# Patient Record
Sex: Female | Born: 1960 | Race: Black or African American | Hispanic: No | Smoking: Never smoker
Health system: Southern US, Community
[De-identification: ages and names within clinical notes are randomized; demographics above are authoritative.]

## PROBLEM LIST (undated history)

## (undated) DIAGNOSIS — I10 Essential (primary) hypertension: Secondary | ICD-10-CM

## (undated) DIAGNOSIS — B019 Varicella without complication: Secondary | ICD-10-CM

## (undated) DIAGNOSIS — E7800 Pure hypercholesterolemia, unspecified: Secondary | ICD-10-CM

## (undated) DIAGNOSIS — D219 Benign neoplasm of connective and other soft tissue, unspecified: Secondary | ICD-10-CM

## (undated) DIAGNOSIS — E785 Hyperlipidemia, unspecified: Secondary | ICD-10-CM

## (undated) HISTORY — DX: Benign neoplasm of connective and other soft tissue, unspecified: D21.9

## (undated) HISTORY — PX: NO PAST SURGERIES: SHX2092

## (undated) HISTORY — DX: Varicella without complication: B01.9

## (undated) HISTORY — DX: Hyperlipidemia, unspecified: E78.5

---

## 2011-11-15 HISTORY — PX: COLONOSCOPY: SHX174

## 2014-05-21 ENCOUNTER — Other Ambulatory Visit: Payer: Self-pay

## 2014-05-21 DIAGNOSIS — Z1231 Encounter for screening mammogram for malignant neoplasm of breast: Secondary | ICD-10-CM

## 2014-06-02 ENCOUNTER — Ambulatory Visit
Admission: RE | Admit: 2014-06-02 | Discharge: 2014-06-02 | Disposition: A | Discharge status: Home / Self Care | Payer: 59 | Source: Ambulatory Visit

## 2014-06-02 DIAGNOSIS — Z1231 Encounter for screening mammogram for malignant neoplasm of breast: Secondary | ICD-10-CM

## 2014-07-16 ENCOUNTER — Encounter: Payer: Self-pay | Admitting: Internal Medicine

## 2014-07-16 ENCOUNTER — Ambulatory Visit (INDEPENDENT_AMBULATORY_CARE_PROVIDER_SITE_OTHER): Payer: 59 | Admitting: Internal Medicine

## 2014-07-16 VITALS — BP 104/72 | Temp 98.7°F | Ht 63.0 in | Wt 144.0 lb

## 2014-07-16 DIAGNOSIS — D6489 Other specified anemias: Secondary | ICD-10-CM

## 2014-07-16 DIAGNOSIS — Z299 Encounter for prophylactic measures, unspecified: Secondary | ICD-10-CM

## 2014-07-16 NOTE — Patient Instructions (Signed)
Get copy of  colonoscopy  And any immunizations. Last set of blood work.   Plan fasting lab work  At [facility] . I will put in orders .  Glad you are healthy .  Healthy lifestyle includes : At least 150 minutes of exercise weeks  , weight at healthy levels, which is usually   BMI 19-25. Avoid trans fats and processed foods;  Increase fresh fruits and veges to 5 servings per day. And avoid sweet beverages including tea and juice. Mediterranean diet with olive oil and nuts have been noted to be heart and brain healthy . Avoid tobacco products . Limit  alcohol to  7 per week for women and 14 servings for men.  Get adquate sleep .  If all ok   Then wellness in a year   i

## 2014-07-16 NOTE — Progress Notes (Signed)
Pre visit review using our clinic review tool, if applicable. No additional management support is needed unless otherwise documented below in the visit note.  Chief Complaint  Patient presents with  . Establish Care    HPI: Patient Sharon Wells  53 y.o. 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 Previous care was   [facility] Dr. Antionette Char. from when she moved about 6-8 months ago. She is generally well no surgeries or major illnesses. Medical history questionnaire reviewed. 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 She had had significant anemia probably from hypermenorrhea around menopause. She believes that her last full CBC showed a hemoglobin in the 7-8 range. Since then she had taken iron and felt some better but it hasn't been rechecked. No unusual bruising or bleeding. No personal history of clotting. No history of complicated pregnancy. Reports good cholesterol levels last time it was checked.  Health Maintenance  Topic Date Due  . Influenza Vaccine  06/14/2014  . Mammogram  06/02/2016  . Pap Smear  05/08/2017  . Colonoscopy  11/14/2022  . Tetanus/tdap  11/14/2022   Health Maintenance Review LIFESTYLE:  Exercise:  At least 3 times a week currently [employer] class Tobacco/ETS: No Alcohol: per day no Sugar beverages: Sleep: 5 hours household of 1 Drug use: no Colonoscopy: 2014 Mammogram 06/02/2014 Pap 05/09/2014 no history of abnormal Says she is up-to-date on immunizations records pending.   ROS:  No restrictions exercise physical trying to get rid of a few pounds. GEN/ HEENT: No fever, significant weight changes sweats headaches vision problems hearing changes, CV/ PULM; No chest pain shortness of breath cough, syncope,edema  change in exercise tolerance. GI /GU: No adominal pain, vomiting, change in bowel habits. No blood in the stool. No significant GU symptoms. SKIN/HEME: ,no acute skin  rashes suspicious lesions or bleeding. No lymphadenopathy, nodules, masses.  NEURO/ PSYCH:  No neurologic signs such as weakness numbness. No depression anxiety. IMM/ Allergy: No unusual infections.  Allergy .   REST of 12 system review negative except as per HPI   Past Medical History  Diagnosis Date  . Fibroids     With history of anemia  . Chicken pox     Family History  Problem Relation Age of Onset  . Sarcoidosis Mother     Deceased in her 35s  . Hypertension Sister   . Diabetes Sister   . Sarcoidosis Sister   . COPD Sister     Emphysema  . Pulmonary embolism Sister     Apparently am provoked  . Heart disease Maternal Grandmother     History   Social History  . Marital Status: Legally Separated    Spouse Name: N/A    Number of Children: N/A  . Years of Education: N/A   Social History Main Topics  . Smoking status: Never Smoker   . Smokeless tobacco: Never Used  . Alcohol Use: No  . Drug Use: No  . Sexual Activity: None   Other Topics Concern  . 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    Outpatient Encounter Prescriptions as  of 07/16/2014  Medication Sig  . NON FORMULARY [employer]: Multivitamin Complex with TRE-EN-EN Grain Concentrates  . NON FORMULARY [employer]:  Calcium 300 mg, D3 1000 iu and Magnesium 150 mg.  Takes 3 Daily    EXAM:  BP 104/72  Temp(Src) 98.7 F (37.1 C) (Oral)  Ht 5\' 3"  (1.6 m)  Wt 144 lb (65.318 kg)  BMI 25.51 kg/m2  LMP 12/15/2013  Body mass index is 25.51 kg/(m^2).  Physical Exam: Vital signs reviewed AOZ:HYQM is a well-developed well-nourished alert cooperative    who appearsr stated age in no acute distress.  HEENT: normocephalic atraumatic , grossly normal NECK: supple  without masses, thyromegaly or bruits. CHEST/PULM:  Clear to auscultation and percussion breath sounds equal no wheeze , rales or rhonchi.  CV: PMI is nondisplaced, S1 S2 no gallops, murmurs, rubs. Peripheral pulses are full without delay.No JVD .  ABDOMEN: Bowel sounds normal nontender  No guard or rebound, no hepato splenomegal no CVA tenderness.  Extremtities:  No clubbing cyanosis or edema, no acute joint swelling or redness no focal atrophy NEURO:  Oriented x3, cranial nerves 3-12 appear to be intact, no obvious focal weakness,gait within normal limits SKIN: No acute rashes normal turgor, color, no bruising or petechiae. PSYCH: Oriented, good eye contact, no obvious depression anxiety, cognition and judgment appear normal. LN: no cervical adenopathy   ASSESSMENT AND PLAN:  Discussed the following assessment and plan:  Other specified anemias - By history probably from hypermenorrhea around menopause probably better no recent bleeding plan CPX labs and ferritin at the [facility] lab - Plan: Basic metabolic panel, CBC with Differential, Hepatic function panel, Lipid panel, TSH, Ferritin  Preventive measure - Plan: Basic metabolic panel, CBC with Differential, Hepatic function panel, Lipid panel, TSH, Ferritin Reviewed information and is copy of pertinent immunizations colonoscopy etc. Sign up for my chart if lab is okay can see her in a year with CPX labs as appropriate. Patient Care Team: Burnis Medin, MD as PCP - General (Internal Medicine) Princess Bruins, MD as Consulting Physician (Obstetrics and Gynecology) Patient Instructions  Get copy of  colonoscopy  And any immunizations. Last set of blood work.   Plan fasting lab work  At [facility] . I will put in orders .  Glad you are healthy .  Healthy lifestyle includes : At least 150 minutes of exercise weeks  , weight at healthy levels, which is usually   BMI 19-25. Avoid trans fats and processed foods;  Increase fresh fruits and  veges to 5 servings per day. And avoid sweet beverages including tea and juice. Mediterranean diet with olive oil and nuts have been noted to be heart and brain healthy . Avoid tobacco products . Limit  alcohol to  7 per week for women and 14 servings for men.  Get adquate sleep .  If all ok   Then wellness in a year   i     Sante Biedermann K. Nuchem Grattan M.D.

## 2014-09-02 ENCOUNTER — Emergency Department (HOSPITAL_COMMUNITY)
Admission: EM | Admit: 2014-09-02 | Discharge: 2014-09-03 | Disposition: A | Discharge status: Home / Self Care | Payer: PRIVATE HEALTH INSURANCE | Attending: Emergency Medicine | Admitting: Emergency Medicine

## 2014-09-02 ENCOUNTER — Encounter (HOSPITAL_COMMUNITY): Payer: Self-pay | Admitting: Emergency Medicine

## 2014-09-02 DIAGNOSIS — T7840XA Allergy, unspecified, initial encounter: Secondary | ICD-10-CM

## 2014-09-02 DIAGNOSIS — Z79899 Other long term (current) drug therapy: Secondary | ICD-10-CM | POA: Insufficient documentation

## 2014-09-02 DIAGNOSIS — Z8742 Personal history of other diseases of the female genital tract: Secondary | ICD-10-CM | POA: Diagnosis not present

## 2014-09-02 DIAGNOSIS — Z8619 Personal history of other infectious and parasitic diseases: Secondary | ICD-10-CM | POA: Diagnosis not present

## 2014-09-02 DIAGNOSIS — R0989 Other specified symptoms and signs involving the circulatory and respiratory systems: Secondary | ICD-10-CM | POA: Insufficient documentation

## 2014-09-02 DIAGNOSIS — T50B95A Adverse effect of other viral vaccines, initial encounter: Secondary | ICD-10-CM | POA: Insufficient documentation

## 2014-09-02 MED ORDER — METHYLPREDNISOLONE SODIUM SUCC 125 MG IJ SOLR
125.0000 mg | Freq: Once | INTRAMUSCULAR | Status: AC
  Administered 2014-09-02: 125 mg via INTRAVENOUS
  Filled 2014-09-02: qty 2

## 2014-09-02 MED ORDER — FAMOTIDINE IN NACL 20-0.9 MG/50ML-% IV SOLN
20.0000 mg | Freq: Once | INTRAVENOUS | Status: AC
  Administered 2014-09-02: 20 mg via INTRAVENOUS
  Filled 2014-09-02: qty 50

## 2014-09-02 MED ORDER — SODIUM CHLORIDE 0.9 % IV BOLUS (SEPSIS)
1000.0000 mL | Freq: Once | INTRAVENOUS | Status: AC
  Administered 2014-09-02: 1000 mL via INTRAVENOUS

## 2014-09-02 MED ORDER — DIPHENHYDRAMINE HCL 50 MG/ML IJ SOLN
25.0000 mg | Freq: Once | INTRAMUSCULAR | Status: AC
  Administered 2014-09-02: 25 mg via INTRAVENOUS
  Filled 2014-09-02: qty 1

## 2014-09-02 MED ORDER — PREDNISONE 20 MG PO TABS: 40.0000 mg | ORAL_TABLET | Freq: Every day | ORAL | Status: DC

## 2014-09-02 MED ORDER — LORAZEPAM 2 MG/ML IJ SOLN
1.0000 mg | Freq: Once | INTRAMUSCULAR | Status: AC
  Administered 2014-09-02: 0.5 mg via INTRAVENOUS
  Filled 2014-09-02: qty 1

## 2014-09-02 NOTE — ED Notes (Signed)
Pt reports decrease in shaking.

## 2014-09-02 NOTE — ED Notes (Signed)
Pt presents with c/o allergic reaction to the flu shot. Pt received the flu shot a little before 3, known egg allergy so she was given the egg free kind. Pt reports she started having symptoms after she left the office, feels like she is having some trouble breathing and her throat feels like it is closing at this point.

## 2014-09-02 NOTE — Discharge Instructions (Signed)

## 2014-09-02 NOTE — ED Notes (Signed)
Pt denies change in symptoms. PA notified.

## 2014-09-02 NOTE — ED Provider Notes (Signed)
CSN: [id number]     Arrival date & time 09/02/14  1541 History   First MD Initiated Contact with Patient 09/02/14 1553     Chief Complaint  Patient presents with  . Allergic Reaction     (Consider location/radiation/quality/duration/timing/severity/associated sxs/prior Treatment) HPI Comments: Patient is a 53 year old female with history of fibroids and chickenpox who presents to the emergency department today with the sensation that her throat is closing. She reports be few hours ago she received the egg free flu shot as she is allergic to eggs. Initially she felt as though her throat was scratchy and asked her coworker for Benadryl. She was unable to obtain a Benadryl. This has gradually worsened into sensation that her throat is closing. She denies any chest pain or shortness of breath. No rash, nausea, vomiting.  The history is provided by the patient. No language interpreter was used.    Past Medical History  Diagnosis Date  . Fibroids     With history of anemia  . Chicken pox    History reviewed. No pertinent past surgical history. Family History  Problem Relation Age of Onset  . Sarcoidosis Mother     Deceased in her 23s  . Hypertension Sister   . Diabetes Sister   . Sarcoidosis Sister   . COPD Sister     Emphysema  . Pulmonary embolism Sister     Apparently am provoked  . Heart disease Maternal Grandmother    History  Substance Use Topics  . Smoking status: Never Smoker   . Smokeless tobacco: Never Used  . Alcohol Use: No   OB History   Grav Para Term Preterm Abortions TAB SAB Ect Mult Living   3 3 3             Review of Systems  Constitutional: Negative for fever and chills.  HENT: Positive for trouble swallowing.        Sensation throat is closing  Respiratory: Negative for shortness of breath.   Cardiovascular: Negative for chest pain.  Gastrointestinal: Negative for nausea, vomiting and abdominal pain.  All other systems reviewed and are  negative.     Allergies  Eggs or egg-derived products  Home Medications   Prior to Admission medications   Medication Sig Start Date End Date Taking? Authorizing Provider  NON FORMULARY [employer]: Multivitamin Complex with TRE-EN-EN Grain Concentrates   Yes Historical Provider, MD  NON FORMULARY [employer]:  Calcium 300 mg, D3 1000 iu and Magnesium 150 mg.  Takes 3 Daily   Yes Historical Provider, MD   BP 129/76  Pulse 90  Temp(Src) 98.6 F (37 C) (Oral)  Resp 16  SpO2 100% Physical Exam  Nursing note and vitals reviewed. Constitutional: She is oriented to person, place, and time. She appears well-developed and well-nourished. No distress.  HENT:  Head: Normocephalic and atraumatic.  Right Ear: External ear normal.  Left Ear: External ear normal.  Nose: Nose normal.  Mouth/Throat: Oropharynx is clear and moist.  Easily maintaining own secretions.   Eyes: Conjunctivae are normal.  Neck: Normal range of motion.  Cardiovascular: Normal rate, regular rhythm and normal heart sounds.   Pulmonary/Chest: Effort normal and breath sounds normal. No stridor. No respiratory distress. She has no wheezes. She has no rales.  Speaking in full sentences.  Abdominal: Soft. She exhibits no distension.  Musculoskeletal: Normal range of motion.  Neurological: She is alert and oriented to person, place, and time. She has normal strength.  Skin: Skin is warm and dry.  She is not diaphoretic. No erythema.  Psychiatric: She has a normal mood and affect. Her behavior is normal.    ED Course  Procedures (including critical care time) Labs Review Labs Reviewed - No data to display  Imaging Review No results found.   EKG Interpretation None      MDM   Final diagnoses:  Allergic reaction, initial encounter    Patient re-evaluated prior to dc, is hemodynamically stable, in no respiratory distress, and denies the feeling of throat closing. Patient able to tolerate PO fluids in ED without  issue. Monitored for 4 hours. Pt has been advised to take prednisone and OTC benadryl. Return to the ED if they have a mod-severe allergic rxn (s/s including throat closing, difficulty breathing, swelling of lips face or tongue). Pt is to follow up with their PCP. Pt is agreeable with plan & verbalizes understanding. Dr. Regenia Skeeter evaluated patient and agrees with plan. Patient / Family / Caregiver informed of clinical course, understand medical decision-making process, and agree with plan.   Elwyn Lade, PA-C 09/03/14 0120

## 2014-09-02 NOTE — ED Notes (Signed)
Pt reports given egg free flu shot at 1450. Shortly after pt experienced difficulty swallowing, jittery, and tachycardic. Pt VS stable at present time and reports decrease in symptoms.

## 2014-09-03 ENCOUNTER — Ambulatory Visit (INDEPENDENT_AMBULATORY_CARE_PROVIDER_SITE_OTHER): Payer: PRIVATE HEALTH INSURANCE | Admitting: Internal Medicine

## 2014-09-03 ENCOUNTER — Encounter: Payer: Self-pay | Admitting: Internal Medicine

## 2014-09-03 VITALS — BP 146/88 | HR 81 | Temp 98.8°F | Wt 147.2 lb

## 2014-09-03 DIAGNOSIS — D6489 Other specified anemias: Secondary | ICD-10-CM | POA: Diagnosis not present

## 2014-09-03 DIAGNOSIS — R030 Elevated blood-pressure reading, without diagnosis of hypertension: Secondary | ICD-10-CM | POA: Diagnosis not present

## 2014-09-03 DIAGNOSIS — IMO0001 Reserved for inherently not codable concepts without codable children: Secondary | ICD-10-CM | POA: Insufficient documentation

## 2014-09-03 DIAGNOSIS — Z418 Encounter for other procedures for purposes other than remedying health state: Secondary | ICD-10-CM | POA: Diagnosis not present

## 2014-09-03 DIAGNOSIS — Z299 Encounter for prophylactic measures, unspecified: Secondary | ICD-10-CM

## 2014-09-03 DIAGNOSIS — T7840XS Allergy, unspecified, sequela: Secondary | ICD-10-CM

## 2014-09-03 DIAGNOSIS — T50Z95S Adverse effect of other vaccines and biological substances, sequela: Secondary | ICD-10-CM

## 2014-09-03 DIAGNOSIS — T50Z95A Adverse effect of other vaccines and biological substances, initial encounter: Secondary | ICD-10-CM | POA: Insufficient documentation

## 2014-09-03 LAB — CBC WITH DIFFERENTIAL/PLATELET
Basophils Absolute: 0 10*3/uL (ref 0.0–0.1)
Basophils Relative: 0.1 % (ref 0.0–3.0)
Eosinophils Absolute: 0 10*3/uL (ref 0.0–0.7)
Eosinophils Relative: 0 % (ref 0.0–5.0)
HCT: 36.8 % (ref 36.0–46.0)
HEMOGLOBIN: 12 g/dL (ref 12.0–15.0)
LYMPHS PCT: 6.3 % — AB (ref 12.0–46.0)
Lymphs Abs: 0.7 10*3/uL (ref 0.7–4.0)
MCHC: 32.5 g/dL (ref 30.0–36.0)
MCV: 84.7 fl (ref 78.0–100.0)
MONOS PCT: 2.1 % — AB (ref 3.0–12.0)
Monocytes Absolute: 0.2 10*3/uL (ref 0.1–1.0)
NEUTROS PCT: 91.5 % — AB (ref 43.0–77.0)
Neutro Abs: 10 10*3/uL — ABNORMAL HIGH (ref 1.4–7.7)
Platelets: 317 10*3/uL (ref 150.0–400.0)
RBC: 4.34 Mil/uL (ref 3.87–5.11)
RDW: 13.1 % (ref 11.5–15.5)
WBC: 10.9 10*3/uL — ABNORMAL HIGH (ref 4.0–10.5)

## 2014-09-03 NOTE — Assessment & Plan Note (Signed)
Uncertain cause of her symptoms we'll do an allergy referral to help delineate causes  Her past history of egg allergy was based on a self-report of nausea after having some egg dishes which may not be a true allergy.

## 2014-09-03 NOTE — Progress Notes (Signed)
Pre visit review using our clinic review tool, if applicable. No additional management support is needed unless otherwise documented below in the visit note.  Chief Complaint  Patient presents with  . Reaction to Influenza Vaccine    Pt received flu vaccine on 09/02/14.  She felt like her throat was closing up, shaking, elevated bp and elevated hr.  Seen in ED    HPI: Patient Sharon Wells  comes in today for SDA for  new problem evaluation.Fu ed visit . [facility]   ef[facility] free  flu immunization Sat for 30 mintues  .  Was okay and was in her car driving and developed difficulty swallowing without cough swelling edema or itching  .   Then began hard to swallow. And  Camera operator   Initially thought   Then heart pounding  And and bp was up. 180 range  . Was in the ED and was givensolumedraol and pepcid and pred and then got tremor   After then   Ativan some help   Now feels  Queasy   And hands are still tremoring   No itching or rash never had before.  Has never seen symptoms like this before pred pepcid bid and  Benadryl. Which she hasn't taken yet Throat now hesitant but nothing  Like before felt ok with tea.   In late 53s noted  Felt nauseaous and  Self diagnosed.  Egg allergy was never formal and never had hives with this Remote hx of hives.  As a child and grew out of it.  ROS: See pertinent positives and negatives per HPI. No current chest pain shortness of breath syncope swelling diarrhea  Past Medical History  Diagnosis Date  . Fibroids     With history of anemia  . Chicken pox     Family History  Problem Relation Age of Onset  . Sarcoidosis Mother     Deceased in her 55s  . Hypertension Sister   . Diabetes Sister   . Sarcoidosis Sister   . COPD Sister     Emphysema  . Pulmonary embolism Sister     Apparently am provoked  . Heart disease Maternal Grandmother     History   Social History  . Marital Status: Legally Separated    Spouse Name: N/A   Number of Children: N/A  . Years of Education: N/A   Social History Main Topics  . Smoking status: Never Smoker   . Smokeless tobacco: Never Used  . Alcohol Use: No  . Drug Use: No  . Sexual Activity: None   Other Topics Concern  . 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    Outpatient Encounter Prescriptions as of 09/03/2014  Medication Sig  . NON FORMULARY [employer]: Multivitamin Complex with TRE-EN-EN Grain Concentrates  . NON FORMULARY [employer]:  Calcium 300 mg, D3 1000 iu and Magnesium 150 mg.  Takes 3 Daily  . predniSONE (DELTASONE) 20 MG tablet Take 2 tablets (40 mg total) by mouth daily.    EXAM:  BP 146/88  Pulse 81  Temp(Src) 98.8 F (37.1 C) (Oral)  Wt 147 lb 3.2 oz (66.769 kg)  SpO2 98%  Body mass index is 26.08 kg/(m^2).  GENERAL: vitals reviewed and listed above, alert, oriented, appears well hydrated and in no acute distress HEENT: atraumatic, conjunctiva  clear, no obvious abnormalities on inspection of external nose and ears OP : no lesion edema or exudate  NECK: no obvious masses on inspection palpation  LUNGS: clear to auscultation bilaterally, no wheezes, rales or rhonchi, good air movement CV: HRRR, no clubbing cyanosis or  peripheral edema nl cap refill  MS: moves all extremities without noticeable focal  Abnormality Neurologic nonfocal no obvious tremor at this time skin no acute rashes PSYCH: pleasant and cooperative, no obvious depression or anxiety  ASSESSMENT AND PLAN:  Discussed the following assessment and plan:  Adverse reaction to vaccine, sequela - Presumed reaction because of contacts not anaphylactic but throat symptoms alignment treated with prednisone and antihistamines. - Plan:  Ambulatory referral to Allergy  Elevated blood pressure - Elevated during ED visit a bit elevated now we'll monitor at followup  Anemia due to other cause - By history probably from hypermenorrhea around menopause probably better no recent bleeding plan CPX labs and ferritin at the [facility] lab - Plan: Basic metabolic panel, CBC with Differential, Hepatic function panel, Lipid panel, TSH, Ferritin  Preventive measure - Plan: Basic metabolic panel, CBC with Differential, Hepatic function panel, Lipid panel, TSH, Ferritin  Allergic reaction, sequela - Plan: Ambulatory referral to Allergy He is still on prednisone 20 mg twice a day for total of 5 days. Patient asks about doing her lab work today she is not fasting and she is on prednisone we'll have to interpret the lab space on contacts criteria she states she had a copy of her other labs faxed so we can compare. Will review for this I think the tremors are related to the prednisone. -Patient advised to return or notify health care team  if symptoms worsen ,persist or new concerns arise.  Patient Instructions  Will be contacted  about allergy  referral.  Ok to take benadryl or zyrtec. Can cause  Drowsiness I thin the shaking is from prednisone but finish out the med.   Lab today ok with interpretation.  Fu if relapsed and as planned       Wanda K. Panosh M.D.

## 2014-09-03 NOTE — Patient Instructions (Signed)
Will be contacted  about allergy  referral.  Ok to take benadryl or zyrtec. Can cause  Drowsiness I thin the shaking is from prednisone but finish out the med.   Lab today ok with interpretation.  Fu if relapsed and as planned

## 2014-09-04 LAB — LIPID PANEL
Cholesterol: 247 mg/dL — ABNORMAL HIGH (ref 0–200)
HDL: 60.1 mg/dL (ref >39.00)
LDL Cholesterol: 176 mg/dL — ABNORMAL HIGH (ref 0–99)
NonHDL: 186.9
Total CHOL/HDL Ratio: 4
Triglycerides: 54 mg/dL (ref 0.0–149.0)
VLDL: 10.8 mg/dL (ref 0.0–40.0)

## 2014-09-04 LAB — BASIC METABOLIC PANEL
BUN: 14 mg/dL (ref 6–23)
CALCIUM: 9.4 mg/dL (ref 8.4–10.5)
CO2: 29 mEq/L (ref 19–32)
Chloride: 106 mEq/L (ref 96–112)
Creatinine, Ser: 0.5 mg/dL (ref 0.4–1.2)
GFR: 154.1 mL/min (ref >60.00)
Glucose, Bld: 83 mg/dL (ref 70–99)
Potassium: 3.7 mEq/L (ref 3.5–5.1)
SODIUM: 140 meq/L (ref 135–145)

## 2014-09-04 LAB — HEPATIC FUNCTION PANEL
ALT: 17 U/L (ref 0–35)
AST: 19 U/L (ref 0–37)
Albumin: 3.6 g/dL (ref 3.5–5.2)
Alkaline Phosphatase: 66 U/L (ref 39–117)
BILIRUBIN DIRECT: 0 mg/dL (ref 0.0–0.3)
BILIRUBIN TOTAL: 0.6 mg/dL (ref 0.2–1.2)
Total Protein: 7.8 g/dL (ref 6.0–8.3)

## 2014-09-04 LAB — FERRITIN: Ferritin: 35.6 ng/mL (ref 10.0–291.0)

## 2014-09-04 LAB — TSH: TSH: 0.6 u[IU]/mL (ref 0.35–4.50)

## 2014-09-04 NOTE — ED Provider Notes (Signed)
Medical screening examination/treatment/procedure(s) were conducted as a shared visit with non-physician practitioner(s) and myself.  I personally evaluated the patient during the encounter.   EKG Interpretation None       Patient with abnormal throat feeling after getting flu shot. I believe this is more anxiety related as ativan is the only thing that helped partially relieve symptoms. Stable while watched in ED for several hours, no progression of symptoms. Low concern for true allergic reaction, highly doubt anaphylaxis. Stable for discharge.  Ephraim Hamburger, MD 09/04/14 [phone]

## 2014-09-15 ENCOUNTER — Encounter: Payer: Self-pay | Admitting: Internal Medicine

## 2014-09-23 ENCOUNTER — Encounter (HOSPITAL_COMMUNITY): Payer: Self-pay | Admitting: Emergency Medicine

## 2014-09-23 ENCOUNTER — Emergency Department (HOSPITAL_COMMUNITY)
Admission: EM | Admit: 2014-09-23 | Discharge: 2014-09-23 | Disposition: A | Discharge status: Home / Self Care | Payer: PRIVATE HEALTH INSURANCE | Attending: Emergency Medicine | Admitting: Emergency Medicine

## 2014-09-23 DIAGNOSIS — T7840XA Allergy, unspecified, initial encounter: Secondary | ICD-10-CM

## 2014-09-23 DIAGNOSIS — Z7952 Long term (current) use of systemic steroids: Secondary | ICD-10-CM | POA: Insufficient documentation

## 2014-09-23 DIAGNOSIS — R070 Pain in throat: Secondary | ICD-10-CM | POA: Diagnosis not present

## 2014-09-23 DIAGNOSIS — Z8742 Personal history of other diseases of the female genital tract: Secondary | ICD-10-CM | POA: Diagnosis not present

## 2014-09-23 DIAGNOSIS — T50B95A Adverse effect of other viral vaccines, initial encounter: Secondary | ICD-10-CM | POA: Diagnosis present

## 2014-09-23 DIAGNOSIS — Z79899 Other long term (current) drug therapy: Secondary | ICD-10-CM | POA: Diagnosis not present

## 2014-09-23 DIAGNOSIS — Z8619 Personal history of other infectious and parasitic diseases: Secondary | ICD-10-CM | POA: Diagnosis not present

## 2014-09-23 MED ORDER — DIPHENHYDRAMINE HCL 50 MG/ML IJ SOLN
25.0000 mg | Freq: Once | INTRAMUSCULAR | Status: AC
  Administered 2014-09-23: 25 mg via INTRAVENOUS
  Filled 2014-09-23: qty 1

## 2014-09-23 MED ORDER — SODIUM CHLORIDE 0.9 % IV BOLUS (SEPSIS)
1000.0000 mL | Freq: Once | INTRAVENOUS | Status: AC
  Administered 2014-09-23: 1000 mL via INTRAVENOUS

## 2014-09-23 MED ORDER — LORAZEPAM 2 MG/ML IJ SOLN
1.0000 mg | Freq: Once | INTRAMUSCULAR | Status: AC
  Administered 2014-09-23: 0.5 mg via INTRAVENOUS
  Filled 2014-09-23: qty 1

## 2014-09-23 NOTE — ED Provider Notes (Signed)
CSN: [id number]     Arrival date & time 09/23/14  1545 History   First MD Initiated Contact with Patient 09/23/14 1649     Chief Complaint  Patient presents with  . Allergic Reaction     (Consider location/radiation/quality/duration/timing/severity/associated sxs/prior Treatment) HPI  Sharon Wells is a 53 y.o. female without significant past medical history presenting with sensation of throat tightness after she was seen by her allergist and allergy shots for histamine and headache in her left lower arm. Patient immediately felt her throat was scratchy and felt her heart race. The allergist was not concerned and she left. She went to her place of work and took her BP and found it was elevated to 180/90s. She got in the car and felt shaky and came to the ED. Pt did not take any for this. Pt with recent allergic reaction to flu shot. Pt denies, chest pain, SOB, Nausea, vomiting, abdominal pain, rash.    Past Medical History  Diagnosis Date  . Fibroids     With history of anemia  . Chicken pox    History reviewed. No pertinent past surgical history. Family History  Problem Relation Age of Onset  . Sarcoidosis Mother     Deceased in her 35s  . Hypertension Sister   . Diabetes Sister   . Sarcoidosis Sister   . COPD Sister     Emphysema  . Pulmonary embolism Sister     Apparently am provoked  . Heart disease Maternal Grandmother    History  Substance Use Topics  . Smoking status: Never Smoker   . Smokeless tobacco: Never Used  . Alcohol Use: No   OB History    Gravida Para Term Preterm AB TAB SAB Ectopic Multiple Living   3 3 3             Review of Systems  Constitutional: Negative for fever and chills.  HENT: Negative for congestion and rhinorrhea.   Respiratory: Negative for cough and shortness of breath.   Cardiovascular: Negative for chest pain and palpitations.  Gastrointestinal: Negative for nausea, vomiting and diarrhea.  Musculoskeletal: Negative for back pain  and gait problem.  Skin: Negative for rash.  Neurological: Negative for weakness and headaches.      Allergies  Eggs or egg-derived products  Home Medications   Prior to Admission medications   Medication Sig Start Date End Date Taking? Authorizing Provider  NON FORMULARY [employer]: Multivitamin Complex with TRE-EN-EN Grain Concentrates   Yes Historical Provider, MD  NON FORMULARY [employer]:  Calcium 300 mg, D3 1000 iu and Magnesium 150 mg.  Takes 3 Daily   Yes Historical Provider, MD  predniSONE (DELTASONE) 20 MG tablet Take 2 tablets (40 mg total) by mouth daily. 09/02/14   Elwyn Lade, PA-C   BP 116/69 mmHg  Pulse 85  Temp(Src) 98.5 F (36.9 C) (Oral)  Resp 16  SpO2 98% Physical Exam  Constitutional: She appears well-developed and well-nourished. No distress.  HENT:  Head: Normocephalic and atraumatic.  Mouth/Throat: Oropharynx is clear and moist.  No facial, neck or oropharynx swelling. Pt tolerating her secretions. No oral lesions.  Eyes: Conjunctivae and EOM are normal. Right eye exhibits no discharge. Left eye exhibits no discharge.  Neck: Normal range of motion.  Cardiovascular: Normal rate, regular rhythm and normal heart sounds.   Pulmonary/Chest: Effort normal and breath sounds normal. No respiratory distress. She has no wheezes.  Abdominal: Soft. Bowel sounds are normal. She exhibits no distension. There is no tenderness.  Neurological: She is alert. She exhibits normal muscle tone. Coordination normal.  Skin: Skin is warm and dry. She is not diaphoretic.  2cm mild erythematous circular area around injection site. No other rash or erythema.  Nursing note and vitals reviewed.   ED Course  Procedures (including critical care time) Labs Review Labs Reviewed - No data to display  Imaging Review No results found.   EKG Interpretation None      MDM   Final diagnoses:  Allergic reaction, initial encounter   Pt with sensation of throat tightness after  allergy shots from allergist. Her presentation is very similar to presentation 09/02/14 after potential allergic reaction to flu shot. At that visit ativan was the only medication to improve her symptoms and it was noted that anxiety played a role. I suspect it is playing a role today as well. Today patients exam nonconcerning for allergic reaction or anaphylaxis. pts vitals reassuring. Pt tolerating secretions, no facial or oropharynx swelling. No rash, difficulty breathing. Pt may take benadryl as needed.  Pt is to follow up with their PCP. Pt is agreeable with plan & verbalizes understanding.  Discussed return precautions with patient. Discussed all results and patient verbalizes understanding and agrees with plan.        Pura Spice, PA-C 09/24/14 0159  Richarda Blade, MD 09/25/14 [phone]

## 2014-09-23 NOTE — ED Notes (Signed)
Per pt, states she went to allergist and was tested r/t allergic reaction to flu shot on the 20 th-now having elevated HR and BP

## 2014-09-23 NOTE — Discharge Instructions (Signed)
Return to the emergency room with worsening of symptoms, new symptoms or with symptoms that are concerning, especially sensation of throat closing, shortness of breath, rash, abdominal pain, nausea, vomiting, fevers. Follow up with PCP. Continue to take benadryl every 6 hours for next 24 hours.

## 2014-09-24 ENCOUNTER — Telehealth: Payer: Self-pay | Admitting: Internal Medicine

## 2014-09-24 NOTE — Telephone Encounter (Signed)
Pt need a 30 minute ER fup for an allergic reaction.. May I use any 2 slots to schedule her .

## 2014-09-24 NOTE — Telephone Encounter (Signed)
Pt called back and stated she could leave work at 2.

## 2014-09-25 NOTE — Telephone Encounter (Signed)
Pt said her schedule and Dr Regis Bill does not allow her to come this month. She said Dr Regis Bill can review the notes in epic and then let her know if she needs to come in or if she can wait till the new year.

## 2014-09-25 NOTE — Telephone Encounter (Signed)
Pt said she went to see allergist Dr Genia Plants and that  is how she ended up at the ED. She said she does not want to go back .

## 2014-09-25 NOTE — Telephone Encounter (Signed)
i  reveiwed ED note and  would like her to get appt with her allergist  Soon with ED records  To have them review and advise intervention etc   I can see her  In office but   AFTER allergy opinion.

## 2014-09-26 NOTE — Telephone Encounter (Signed)
I can see her next week but i really need medical input from  allergist  And or see  At least get a copy of notes and assessment to decide what happened and what to do going forward .    please get a copy of the notes asap

## 2014-10-01 NOTE — Telephone Encounter (Signed)
We don't contact offices to request notes.Marland Kitchen

## 2014-10-01 NOTE — Telephone Encounter (Signed)
lmovm to c/b and schedule °

## 2014-10-01 NOTE — Telephone Encounter (Signed)
Called Dr.Kozlow office for office note for this pt they are not available at this time,but will sent when dr sign off.

## 2014-10-01 NOTE — Telephone Encounter (Signed)
Pt has been scheduled for Nov 06 2014. Who will be getting the notes from the allergist

## 2014-10-01 NOTE — Telephone Encounter (Signed)
Please get notes from Dr. Ria Clock office for [facility].  Thanks!

## 2014-10-14 NOTE — Telephone Encounter (Signed)
Paper work from Dr. Bruna Potter office received and placed in [employer] tray with other reports.

## 2014-11-06 ENCOUNTER — Ambulatory Visit (INDEPENDENT_AMBULATORY_CARE_PROVIDER_SITE_OTHER): Payer: 59 | Admitting: Internal Medicine

## 2014-11-06 ENCOUNTER — Encounter: Payer: Self-pay | Admitting: Internal Medicine

## 2014-11-06 VITALS — BP 110/70 | HR 68 | Temp 98.4°F | Wt 147.7 lb

## 2014-11-06 DIAGNOSIS — R000 Tachycardia, unspecified: Secondary | ICD-10-CM

## 2014-11-06 DIAGNOSIS — T50Z95S Adverse effect of other vaccines and biological substances, sequela: Secondary | ICD-10-CM

## 2014-11-06 NOTE — Patient Instructions (Addendum)
ekg is good  I agree that anxiety attacks should get better but that there was  Some time of reaction after the vaccine  .  Fu if any recurrent sx .   Uncertain what to tell you about future vaccines . As per  Specialists etc .

## 2014-11-06 NOTE — Progress Notes (Signed)
Chief Complaint  Patient presents with  . Follow-up    allergist    HPI: Sharon Wells 53 y.o.  Comes in for disc about poss allergic or other recation from injection   Had allergy eval dr Carmelina Peal and has /s  No completes  Skin testing neg but never go blood tests and challenge    WC and also didn't feel like going back .  Avoiding foods with eggs has had some anxiety since then but  Aware and now not happening .   Episode in allergise office was   Not stressed and suddenly had elevated hr 120 and then felt bad no hives  bp went up and then got anxiety.  Carried benadryl around with her not use  Never had itching or hives.  ROS: See pertinent positives and negatives per HPI.  Past Medical History  Diagnosis Date  . Fibroids     With history of anemia  . Chicken pox     Family History  Problem Relation Age of Onset  . Sarcoidosis Mother     Deceased in her 78s  . Hypertension Sister   . Diabetes Sister   . Sarcoidosis Sister   . COPD Sister     Emphysema  . Pulmonary embolism Sister     Apparently am provoked  . Heart disease Maternal Grandmother     History   Social History  . Marital Status: Legally Separated    Spouse Name: N/A    Number of Children: N/A  . Years of Education: N/A   Social History Main Topics  . Smoking status: Never Smoker   . Smokeless tobacco: Never Used  . Alcohol Use: No  . Drug Use: No  . Sexual Activity: None   Other Topics Concern  . 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    Outpatient Encounter Prescriptions as of 11/06/2014  Medication Sig  . NON FORMULARY [employer]: Multivitamin Complex with TRE-EN-EN Grain Concentrates  . NON FORMULARY [employer]:  Calcium 300 mg, D3 1000 iu  and Magnesium 150 mg.  Takes 3 Daily  . [DISCONTINUED] predniSONE (DELTASONE) 20 MG tablet Take 2 tablets (40 mg total) by mouth daily.    EXAM:  BP 110/70 mmHg  Pulse 68  Temp(Src) 98.4 F (36.9 C) (Oral)  Wt 147 lb 11.2 oz (66.996 kg)  SpO2 98%  Body mass index is 26.17 kg/(m^2).  GENERAL: vitals reviewed and listed above, alert, oriented, appears well hydrated and in no acute distress HEENT: atraumatic, conjunctiva  clear, no obvious abnormalities on inspection of external nose and ears OP : no lesion edema or exudate  NECK: no obvious masses on inspection palpation  LUNGS: clear to auscultation bilaterally, no wheezes, rales or rhonchi, good air movement CV: HRRR, no clubbing cyanosis or  peripheral edema nl cap refill  MS: moves all extremities without noticeable focal  abnormality PSYCH: pleasant and cooperative, no obvious depression or anxiety EKG NSR nl intervals  ASSESSMENT AND PLAN:  Discussed the following assessment and plan:  Tachycardia - hx of 120 after allergy testing   neg responses - Plan: EKG 12-Lead  Adverse reaction to vaccine, sequela - uncertain cause  see dr Carmelina Peal note Episode of  Tachy poss anxiety other  Uncertain doesn't sound like arrhythmia  No other alarm features .  Follow for now  Would like her to not be fearful of foods and diet  . consider reveval if  Recurring sx  See not dr Carmelina Peal  Never go immmmunocap etc. -Patient advised to return or notify health care team  if symptoms worsen ,persist or new concerns arise.  Patient Instructions  ekg is good  I agree that anxiety attacks should get better but that there was  Some time of reaction after the vaccine  .  Fu if any recurrent sx .   Uncertain what to tell you about future vaccines . As per  Specialists etc .    Standley Brooking. Panosh M.D.  Pre visit review using our clinic review tool, if applicable. No additional management support is needed unless otherwise documented below in the visit  note.

## 2014-12-16 ENCOUNTER — Ambulatory Visit (INDEPENDENT_AMBULATORY_CARE_PROVIDER_SITE_OTHER): Payer: 59

## 2014-12-16 VITALS — BP 119/70 | HR 82 | Resp 12

## 2014-12-16 DIAGNOSIS — Z299 Encounter for prophylactic measures, unspecified: Secondary | ICD-10-CM

## 2014-12-16 DIAGNOSIS — Z418 Encounter for other procedures for purposes other than remedying health state: Secondary | ICD-10-CM

## 2014-12-16 DIAGNOSIS — R52 Pain, unspecified: Secondary | ICD-10-CM

## 2014-12-16 DIAGNOSIS — Q828 Other specified congenital malformations of skin: Secondary | ICD-10-CM

## 2014-12-16 DIAGNOSIS — M2041 Other hammer toe(s) (acquired), right foot: Secondary | ICD-10-CM

## 2014-12-16 NOTE — Progress Notes (Signed)
   Subjective:    Patient ID: Sharon Wells, female    DOB: 13-Jun-1961, 54 y.o.   MRN: [id number]  HPI PT STATED RT FOOT 5TH TOE HAVE CORN FOR 4 YEARS. THE CORN IS GETTING THICKER AND GET AGGRAVATED BY WEARING SHOES. TRIED TO GET IT TRIM BY PEDIATRIST IN [place] AND IT HELP.   Review of Systems  All other systems reviewed and are negative.      Objective:   Physical Exam Lower extremity objective findings reveal a 54 year old F connecting female well-developed well-nourished oriented 3 presents with a complaint of painful corn which is repetitive on the dorsolateral aspect fifth toe right foot. Patient's had multiple keratoses although the fifth right is most painful symptomatically on eating attention. In the past debridement is been provided's altered shoes as well. Lower extremity objective his reveal pedal pulses palpable DP and PT +2 over 4 bilateral Refill time 3 seconds all digits epicritic and proprioceptive sensations intact and symmetric. There is normal plantar response and DTRs. Dermatologically skin color pigment normal hair growth absent nails unremarkable there is keratoses HD 5 bilateral right fifth most painful tenderness symptomatic Wells nucleus circumscribed there is also keratoses over the IP joints of lesser digits fourth bilateral and not proximal IP joints of the second and third bilateral. The the remaining keratoses or not painful symptomatically although pigment A pigment changes are noted. X-rays reveal adductovarus rotation lesser digits with some rigid contracture hammertoe deformity fifth toe right foot. Hallux is rectus forefoot rectus no osseous abnormalities no cyst or tumors lateral projection reveals mild infracalcaneal spurring mild fascial thickening rectus foot type       Assessment & Plan:  Assessment this time hammertoe deformity fifth toe with adductovarus rotation fifth digit in associate keratosis the keratotic painful corn is debrided at this  time tube foam padding is dispensed literature on hammertoe and hammertoe repair for prevention of keratoses is given at this time patient will consider those options reappointed future on an as-needed basis for either palliative care or possible surgical consultation when ready   Harriet Masson DPM

## 2014-12-16 NOTE — Patient Instructions (Signed)
Corns and Calluses Corns are small areas of thickened skin that usually occur on the top, sides, or tip of a toe. They contain a cone-shaped core with a point that can press on a nerve below. This causes pain. Calluses are areas of thickened skin that usually develop on hands, fingers, palms, soles of the feet, and heels. These are areas that experience frequent friction or pressure. CAUSES  Corns are usually the result of rubbing (friction) or pressure from shoes that are too tight or do not fit properly. Calluses are caused by repeated friction and pressure on the affected areas. SYMPTOMS  A hard growth on the skin.  Pain or tenderness under the skin.  Sometimes, redness and swelling.  Increased discomfort while wearing tight-fitting shoes. DIAGNOSIS  Your caregiver can usually tell what the problem is by doing a physical exam. TREATMENT  Removing the cause of the friction or pressure is usually the only treatment needed. However, sometimes medicines can be used to help soften the hardened, thickened areas. These medicines include salicylic acid plasters and 12% ammonium lactate lotion. These medicines should only be used under the direction of your caregiver. HOME CARE INSTRUCTIONS   Try to remove pressure from the affected area.  You may wear donut-shaped corn pads to protect your skin.  You may use a pumice stone or nonmetallic nail file to gently reduce the thickness of a corn.  Wear properly fitted footwear.  If you have calluses on the hands, wear gloves during activities that cause friction.  If you have diabetes, you should regularly examine your feet. Tell your caregiver if you notice any problems with your feet. SEEK IMMEDIATE MEDICAL CARE IF:   You have increased pain, swelling, redness, or warmth in the affected area.  Your corn or callus starts to drain fluid or bleeds.  You are not getting better, even with treatment. Document Released: 08/06/2004 Document  Revised: 01/23/2012 Document Reviewed: 06/28/2011 ExitCare Patient Information 2015 [employer]. This information is not intended to replace advice given to you by your health care provider. Make sure you discuss any questions you have with your health care provider.  

## 2015-07-17 ENCOUNTER — Other Ambulatory Visit (INDEPENDENT_AMBULATORY_CARE_PROVIDER_SITE_OTHER): Payer: 59

## 2015-07-17 ENCOUNTER — Other Ambulatory Visit: Payer: Self-pay

## 2015-07-17 DIAGNOSIS — Z0000 Encounter for general adult medical examination without abnormal findings: Secondary | ICD-10-CM | POA: Diagnosis not present

## 2015-07-17 LAB — CBC WITH DIFFERENTIAL/PLATELET
BASOS PCT: 0.8 % (ref 0.0–3.0)
Basophils Absolute: 0 10*3/uL (ref 0.0–0.1)
EOS ABS: 0.1 10*3/uL (ref 0.0–0.7)
Eosinophils Relative: 3.1 % (ref 0.0–5.0)
HCT: 38.1 % (ref 36.0–46.0)
Hemoglobin: 12.7 g/dL (ref 12.0–15.0)
Lymphocytes Relative: 48.6 % — ABNORMAL HIGH (ref 12.0–46.0)
Lymphs Abs: 1.8 10*3/uL (ref 0.7–4.0)
MCHC: 33.2 g/dL (ref 30.0–36.0)
MCV: 85.3 fl (ref 78.0–100.0)
MONO ABS: 0.3 10*3/uL (ref 0.1–1.0)
Monocytes Relative: 6.9 % (ref 3.0–12.0)
Neutro Abs: 1.5 10*3/uL (ref 1.4–7.7)
Neutrophils Relative %: 40.6 % — ABNORMAL LOW (ref 43.0–77.0)
Platelets: 296 10*3/uL (ref 150.0–400.0)
RBC: 4.47 Mil/uL (ref 3.87–5.11)
RDW: 13 % (ref 11.5–15.5)
WBC: 3.8 10*3/uL — AB (ref 4.0–10.5)

## 2015-07-17 LAB — HEPATIC FUNCTION PANEL
ALT: 23 U/L (ref 0–35)
AST: 19 U/L (ref 0–37)
Albumin: 4.3 g/dL (ref 3.5–5.2)
Alkaline Phosphatase: 69 U/L (ref 39–117)
BILIRUBIN DIRECT: 0.1 mg/dL (ref 0.0–0.3)
BILIRUBIN TOTAL: 0.6 mg/dL (ref 0.2–1.2)
Total Protein: 7.4 g/dL (ref 6.0–8.3)

## 2015-07-17 LAB — LIPID PANEL
CHOLESTEROL: 262 mg/dL — AB (ref 0–200)
HDL: 42.4 mg/dL (ref >39.00)
LDL Cholesterol: 183 mg/dL — ABNORMAL HIGH (ref 0–99)
NonHDL: 219.14
Total CHOL/HDL Ratio: 6
Triglycerides: 179 mg/dL — ABNORMAL HIGH (ref 0.0–149.0)
VLDL: 35.8 mg/dL (ref 0.0–40.0)

## 2015-07-17 LAB — BASIC METABOLIC PANEL
BUN: 12 mg/dL (ref 6–23)
CO2: 28 mEq/L (ref 19–32)
CREATININE: 0.58 mg/dL (ref 0.40–1.20)
Calcium: 9.7 mg/dL (ref 8.4–10.5)
Chloride: 105 mEq/L (ref 96–112)
GFR: 138.43 mL/min (ref >60.00)
Glucose, Bld: 94 mg/dL (ref 70–99)
Potassium: 4.1 mEq/L (ref 3.5–5.1)
Sodium: 141 mEq/L (ref 135–145)

## 2015-07-17 LAB — TSH: TSH: 1.06 u[IU]/mL (ref 0.35–4.50)

## 2015-07-24 ENCOUNTER — Ambulatory Visit (INDEPENDENT_AMBULATORY_CARE_PROVIDER_SITE_OTHER): Payer: 59 | Admitting: Internal Medicine

## 2015-07-24 ENCOUNTER — Encounter: Payer: Self-pay | Admitting: Internal Medicine

## 2015-07-24 VITALS — BP 120/72 | Temp 98.1°F | Ht 63.0 in | Wt 152.0 lb

## 2015-07-24 DIAGNOSIS — Z0000 Encounter for general adult medical examination without abnormal findings: Secondary | ICD-10-CM | POA: Diagnosis not present

## 2015-07-24 DIAGNOSIS — E785 Hyperlipidemia, unspecified: Secondary | ICD-10-CM

## 2015-07-24 NOTE — Patient Instructions (Signed)
Intensify lifestyle interventions. Diet ary changes  ascvd risk 10 year is 4.3% lifetime 39 % not in the statin benefit group but is not a healthy level.  Can getg lipid panel in 4-6 months if you wish to see progress. Otherwise yearly lab and check up.   Healthy lifestyle includes : At least 150 minutes of exercise weeks  , weight at healthy levels, which is usually   BMI 19-25. Avoid trans fats and processed foods;  Increase fresh fruits and veges to 5 servings per day. And avoid sweet beverages including tea and juice. Mediterranean diet with olive oil and nuts have been noted to be heart and brain healthy . Avoid tobacco products . Limit  alcohol to  7 per week for women and 14 servings for men.  Get adequate sleep . Wear seat belts . Don't text and drive .      Why follow it? Research shows. . Those who follow the Mediterranean diet have a reduced risk of heart disease  . The diet is associated with a reduced incidence of Parkinson's and Alzheimer's diseases . People following the diet may have longer life expectancies and lower rates of chronic diseases  . The Dietary Guidelines for Americans recommends the Mediterranean diet as an eating plan to promote health and prevent disease  What Is the Mediterranean Diet?  . Healthy eating plan based on typical foods and recipes of Mediterranean-style cooking . The diet is primarily a plant based diet; these foods should make up a majority of meals   Starches - Plant based foods should make up a majority of meals - They are an important sources of vitamins, minerals, energy, antioxidants, and fiber - Choose whole grains, foods high in fiber and minimally processed items  - Typical grain sources include wheat, oats, barley, corn, brown rice, bulgar, farro, millet, polenta, couscous  - Various types of beans include chickpeas, lentils, fava beans, black beans, white beans   Fruits  Veggies - Large quantities of antioxidant rich fruits &  veggies; 6 or more servings  - Vegetables can be eaten raw or lightly drizzled with oil and cooked  - Vegetables common to the traditional Mediterranean Diet include: artichokes, arugula, beets, broccoli, brussel sprouts, cabbage, carrots, celery, collard greens, cucumbers, eggplant, kale, leeks, lemons, lettuce, mushrooms, okra, onions, peas, peppers, potatoes, pumpkin, radishes, rutabaga, shallots, spinach, sweet potatoes, turnips, zucchini - Fruits common to the Mediterranean Diet include: apples, apricots, avocados, cherries, clementines, dates, figs, grapefruits, grapes, melons, nectarines, oranges, peaches, pears, pomegranates, strawberries, tangerines  Fats - Replace butter and margarine with healthy oils, such as olive oil, canola oil, and tahini  - Limit nuts to no more than a handful a day  - Nuts include walnuts, almonds, pecans, pistachios, pine nuts  - Limit or avoid candied, honey roasted or heavily salted nuts - Olives are central to the [employer] - can be eaten whole or used in a variety of dishes   Meats Protein - Limiting red meat: no more than a few times a month - When eating red meat: choose lean cuts and keep the portion to the size of deck of cards - Eggs: approx. 0 to 4 times a week  - Fish and lean poultry: at least 2 a week  - Healthy protein sources include, chicken, [place], lean beef, lamb - Increase intake of seafood such as tuna, salmon, trout, mackerel, shrimp, scallops - Avoid or limit high fat processed meats such as sausage and bacon  Dairy -  Include moderate amounts of low fat dairy products  - Focus on healthy dairy such as fat free yogurt, skim milk, low or reduced fat cheese - Limit dairy products higher in fat such as whole or 2% milk, cheese, ice cream  Alcohol - Moderate amounts of red wine is ok  - No more than 5 oz daily for women (all ages) and men older than age 30  - No more than 10 oz of wine daily for men younger than 9  Other - Limit  sweets and other desserts  - Use herbs and spices instead of salt to flavor foods  - Herbs and spices common to the traditional Mediterranean Diet include: basil, bay leaves, chives, cloves, cumin, fennel, garlic, lavender, marjoram, mint, oregano, parsley, pepper, rosemary, sage, savory, sumac, tarragon, thyme   It's not just a diet, it's a lifestyle:  . The Mediterranean diet includes lifestyle factors typical of those in the region  . Foods, drinks and meals are best eaten with others and savored . Daily physical activity is important for overall good health . This could be strenuous exercise like running and aerobics . This could also be more leisurely activities such as walking, housework, yard-work, or taking the stairs . Moderation is the key; a balanced and healthy diet accommodates most foods and drinks . Consider portion sizes and frequency of consumption of certain foods   Meal Ideas & Options:  . Breakfast:  o Whole wheat toast or whole wheat English muffins with peanut butter & hard boiled egg o Steel cut oats topped with apples & cinnamon and skim milk  o Fresh fruit: banana, strawberries, melon, berries, peaches  o Smoothies: strawberries, bananas, greek yogurt, peanut butter o Low fat greek yogurt with blueberries and granola  o Egg white omelet with spinach and mushrooms o Breakfast couscous: whole wheat couscous, apricots, skim milk, cranberries  . Sandwiches:  o Hummus and grilled vegetables (peppers, zucchini, squash) on whole wheat bread   o Grilled chicken on whole wheat pita with lettuce, tomatoes, cucumbers or tzatziki  o Tuna salad on whole wheat bread: tuna salad made with greek yogurt, olives, red peppers, capers, green onions o Garlic rosemary lamb pita: lamb sauted with garlic, rosemary, salt & pepper; add lettuce, cucumber, greek yogurt to pita - flavor with lemon juice and black pepper  . Seafood:  o Mediterranean grilled salmon, seasoned with garlic,  basil, parsley, lemon juice and black pepper o Shrimp, lemon, and spinach whole-grain pasta salad made with low fat greek yogurt  o Seared scallops with lemon orzo  o Seared tuna steaks seasoned salt, pepper, coriander topped with tomato mixture of olives, tomatoes, olive oil, minced garlic, parsley, green onions and cappers  . Meats:  o Herbed greek chicken salad with kalamata olives, cucumber, feta  o Red bell peppers stuffed with spinach, bulgur, lean ground beef (or lentils) & topped with feta   o Kebabs: skewers of chicken, tomatoes, onions, zucchini, squash  o [place] burgers: made with red onions, mint, dill, lemon juice, feta cheese topped with roasted red peppers . Vegetarian o Cucumber salad: cucumbers, artichoke hearts, celery, red onion, feta cheese, tossed in olive oil & lemon juice  o Hummus and whole grain pita points with a greek salad (lettuce, tomato, feta, olives, cucumbers, red onion) o Lentil soup with celery, carrots made with vegetable broth, garlic, salt and pepper  o Tabouli salad: parsley, bulgur, mint, scallions, cucumbers, tomato, radishes, lemon juice, olive oil, salt and pepper.

## 2015-07-24 NOTE — Progress Notes (Signed)
Pre visit review using our clinic review tool, if applicable. No additional management support is needed unless otherwise documented below in the visit note.  Chief Complaint  Patient presents with  . Annual Exam    HPI: Patient  Sharon Wells  54 y.o. comes in today for Preventive Health Care visit   Not eating as well recently fast food  Some sugars   Feels well menopausal  utd on parameters   Health Maintenance  Topic Date Due  . Hepatitis C Screening  1961-08-29  . INFLUENZA VACCINE  07/22/2016 (Originally 06/15/2015)  . HIV Screening  07/22/2016 (Originally 10/19/1974)  . MAMMOGRAM  06/02/2016  . PAP SMEAR  05/08/2017  . COLONOSCOPY  11/14/2022  . TETANUS/TDAP  05/08/2023   Health Maintenance Review LIFESTYLE:  Exercise:   Walking  Some zumba  Tobacco/ETS: no Alcohol: no Sugar beverages: ocass  Fruit punch  Sleep: off an on .    Sometimes  Drug use: no  ROS:  GEN/ HEENT: No fever, significant weight changes sweats headaches vision problems hearing changes, CV/ PULM; No chest pain shortness of breath cough, syncope,edema  change in exercise tolerance. GI /GU: No adominal pain, vomiting, change in bowel habits. No blood in the stool. No significant GU symptoms. SKIN/HEME: ,no acute skin rashes suspicious lesions or bleeding. No lymphadenopathy, nodules, masses.  NEURO/ PSYCH:  No neurologic signs such as weakness numbness. No depression anxiety. IMM/ Allergy: No unusual infections.  Allergy .   REST of 12 system review negative except as per HPI   Past Medical History  Diagnosis Date  . Fibroids     With history of anemia  . Chicken pox     No past surgical history on file.  Family History  Problem Relation Age of Onset  . Sarcoidosis Mother     Deceased in her 17s  . Hypertension Sister   . Diabetes Sister   . Sarcoidosis Sister   . COPD Sister     Emphysema  . Pulmonary embolism Sister     Apparently am provoked  . Heart disease Maternal Grandmother       Social History   Social History  . Marital Status: Legally Separated    Spouse Name: N/A  . Number of Children: N/A  . Years of Education: N/A   Social History Main Topics  . Smoking status: Never Smoker   . Smokeless tobacco: Never Used  . Alcohol Use: No  . Drug Use: No  . Sexual Activity: Not Asked   Other Topics Concern  . 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    Outpatient Prescriptions Prior to Visit  Medication Sig Dispense Refill  . NON FORMULARY [facility]: Multivitamin Complex with TRE-EN-EN Grain Concentrates    . NON FORMULARY [facility]:  Calcium 300 mg, D3 1000 iu and Magnesium 150 mg.  Takes 3 Daily     No facility-administered medications prior to visit.     EXAM:  BP 120/72 mmHg  Temp(Src) 98.1 F (36.7 C) (Oral)  Ht 5\' 3"  (1.6 m)  Wt 152 lb (68.947 kg)  BMI  26.93 kg/m2  Body mass index is 26.93 kg/(m^2).  Physical Exam: Vital signs reviewed NAT:FTDD is a well-developed well-nourished alert cooperative    who appearsr stated age in no acute distress.  HEENT: normocephalic atraumatic , Eyes: PERRL EOM's full, conjunctiva clear, Nares: paten,t no deformity discharge or tenderness., Ears: no deformity EAC's clear TMs with normal landmarks. Mouth: clear OP, no lesions, edema.  Moist mucous membranes. Dentition in adequate repair. Partials  Upper plate  NECK: supple without masses, thyromegaly or bruits. CHEST/PULM:  Clear to auscultation and percussion breath sounds equal no wheeze , rales or rhonchi. No chest wall deformities or tenderness.Breast: normal by inspection . No dimpling, discharge, masses, tenderness or discharge . CV: PMI is nondisplaced,  S1 S2 no gallops, murmurs, rubs. Peripheral pulses are full without delay.No JVD .  ABDOMEN: Bowel sounds normal nontender  No guard or rebound, no hepato splenomegal no CVA tenderness.  Extremtities:  No clubbing cyanosis or edema, no acute joint swelling or redness no focal atrophy NEURO:  Oriented x3, cranial nerves 3-12 appear to be intact, no obvious focal weakness,gait within normal limits no abnormal reflexes or asymmetrical SKIN: No acute rashes normal turgor, color, no bruising or petechiae. PSYCH: Oriented, good eye contact, no obvious depression anxiety, cognition and judgment appear normal. LN: no cervical axillary inguinal adenopathy  Lab Results  Component Value Date   WBC 3.8* 07/17/2015   HGB 12.7 07/17/2015   HCT 38.1 07/17/2015   PLT 296.0 07/17/2015   GLUCOSE 94 07/17/2015   CHOL 262* 07/17/2015   TRIG 179.0* 07/17/2015   HDL 42.40 07/17/2015   LDLCALC 183* 07/17/2015   ALT 23 07/17/2015   AST 19 07/17/2015   NA 141 07/17/2015   K 4.1 07/17/2015   CL 105 07/17/2015   CREATININE 0.58 07/17/2015   BUN 12 07/17/2015   CO2 28 07/17/2015   TSH 1.06 07/17/2015   BP Readings from Last 3 Encounters:  07/24/15 120/72  12/16/14 119/70  11/06/14 110/70    ASSESSMENT AND PLAN:  Discussed the following assessment and plan:  Visit for preventive health examination  Hyperlipidemia - disc lsi can repeat in 6 months no ov needed  yearly check pt educated in [place]: Lipid panel acvs risk lipids 4.3% 10 year. Patient Care Team: Burnis Medin, MD as PCP - General (Internal Medicine) Princess Bruins, MD as Consulting Physician (Obstetrics and Gynecology) Patient Instructions    Intensify lifestyle interventions. Diet ary changes  ascvd risk 10 year is 4.3% lifetime 39 % not in the statin benefit group but is not a healthy level.  Can getg lipid panel in 4-6 months if you wish to see progress. Otherwise yearly lab and check up.   Healthy lifestyle includes  : At least 150 minutes of exercise weeks  , weight at healthy levels, which is usually   BMI 19-25. Avoid trans fats and processed foods;  Increase fresh fruits and veges to 5 servings per day. And avoid sweet beverages including tea and juice. Mediterranean diet with olive oil and nuts have been noted to be heart and brain healthy . Avoid tobacco products . Limit  alcohol to  7 per week for women and 14 servings for men.  Get adequate sleep . Wear seat belts . Don't text and drive .      Why follow it? Research shows. . Those who follow the Mediterranean diet have a reduced risk of heart disease  . The diet is associated with a  reduced incidence of Parkinson's and Alzheimer's diseases . People following the diet may have longer life expectancies and lower rates of chronic diseases  . The Dietary Guidelines for Americans recommends the Mediterranean diet as an eating plan to promote health and prevent disease  What Is the Mediterranean Diet?  . Healthy eating plan based on typical foods and recipes of Mediterranean-style cooking . The diet is primarily a plant based diet; these foods should make up a majority of meals   Starches - Plant based foods should make up a majority of meals - They are an important sources of vitamins, minerals, energy, antioxidants, and fiber - Choose whole grains, foods high in fiber and minimally processed items  - Typical grain sources include wheat, oats, barley, corn, brown rice, bulgar, farro, millet, polenta, couscous  - Various types of beans include chickpeas, lentils, fava beans, black beans, white beans   Fruits  Veggies - Large quantities of antioxidant rich fruits & veggies; 6 or more servings  - Vegetables can be eaten raw or lightly drizzled with oil and cooked  - Vegetables common to the traditional Mediterranean Diet include: artichokes, arugula, beets, broccoli, brussel sprouts, cabbage, carrots, celery, collard greens, cucumbers, eggplant, kale,  leeks, lemons, lettuce, mushrooms, okra, onions, peas, peppers, potatoes, pumpkin, radishes, rutabaga, shallots, spinach, sweet potatoes, turnips, zucchini - Fruits common to the Mediterranean Diet include: apples, apricots, avocados, cherries, clementines, dates, figs, grapefruits, grapes, melons, nectarines, oranges, peaches, pears, pomegranates, strawberries, tangerines  Fats - Replace butter and margarine with healthy oils, such as olive oil, canola oil, and tahini  - Limit nuts to no more than a handful a day  - Nuts include walnuts, almonds, pecans, pistachios, pine nuts  - Limit or avoid candied, honey roasted or heavily salted nuts - Olives are central to the [employer] - can be eaten whole or used in a variety of dishes   Meats Protein - Limiting red meat: no more than a few times a month - When eating red meat: choose lean cuts and keep the portion to the size of deck of cards - Eggs: approx. 0 to 4 times a week  - Fish and lean poultry: at least 2 a week  - Healthy protein sources include, chicken, [place], lean beef, lamb - Increase intake of seafood such as tuna, salmon, trout, mackerel, shrimp, scallops - Avoid or limit high fat processed meats such as sausage and bacon  Dairy - Include moderate amounts of low fat dairy products  - Focus on healthy dairy such as fat free yogurt, skim milk, low or reduced fat cheese - Limit dairy products higher in fat such as whole or 2% milk, cheese, ice cream  Alcohol - Moderate amounts of red wine is ok  - No more than 5 oz daily for women (all ages) and men older than age 67  - No more than 10 oz of wine daily for men younger than 44  Other - Limit sweets and other desserts  - Use herbs and spices instead of salt to flavor foods  - Herbs and spices common to the traditional Mediterranean Diet include: basil, bay leaves, chives, cloves, cumin, fennel, garlic, lavender, marjoram, mint, oregano, parsley, pepper, rosemary, sage, savory,  sumac, tarragon, thyme   It's not just a diet, it's a lifestyle:  . The Mediterranean diet includes lifestyle factors typical of those in the region  . Foods, drinks and meals are best eaten with others and savored . Daily physical activity is  important for overall good health . This could be strenuous exercise like running and aerobics . This could also be more leisurely activities such as walking, housework, yard-work, or taking the stairs . Moderation is the key; a balanced and healthy diet accommodates most foods and drinks . Consider portion sizes and frequency of consumption of certain foods   Meal Ideas & Options:  . Breakfast:  o Whole wheat toast or whole wheat English muffins with peanut butter & hard boiled egg o Steel cut oats topped with apples & cinnamon and skim milk  o Fresh fruit: banana, strawberries, melon, berries, peaches  o Smoothies: strawberries, bananas, greek yogurt, peanut butter o Low fat greek yogurt with blueberries and granola  o Egg white omelet with spinach and mushrooms o Breakfast couscous: whole wheat couscous, apricots, skim milk, cranberries  . Sandwiches:  o Hummus and grilled vegetables (peppers, zucchini, squash) on whole wheat bread   o Grilled chicken on whole wheat pita with lettuce, tomatoes, cucumbers or tzatziki  o Tuna salad on whole wheat bread: tuna salad made with greek yogurt, olives, red peppers, capers, green onions o Garlic rosemary lamb pita: lamb sauted with garlic, rosemary, salt & pepper; add lettuce, cucumber, greek yogurt to pita - flavor with lemon juice and black pepper  . Seafood:  o Mediterranean grilled salmon, seasoned with garlic, basil, parsley, lemon juice and black pepper o Shrimp, lemon, and spinach whole-grain pasta salad made with low fat greek yogurt  o Seared scallops with lemon orzo  o Seared tuna steaks seasoned salt, pepper, coriander topped with tomato mixture of olives, tomatoes, olive oil, minced garlic,  parsley, green onions and cappers  . Meats:  o Herbed greek chicken salad with kalamata olives, cucumber, feta  o Red bell peppers stuffed with spinach, bulgur, lean ground beef (or lentils) & topped with feta   o Kebabs: skewers of chicken, tomatoes, onions, zucchini, squash  o [place] burgers: made with red onions, mint, dill, lemon juice, feta cheese topped with roasted red peppers . Vegetarian o Cucumber salad: cucumbers, artichoke hearts, celery, red onion, feta cheese, tossed in olive oil & lemon juice  o Hummus and whole grain pita points with a greek salad (lettuce, tomato, feta, olives, cucumbers, red onion) o Lentil soup with celery, carrots made with vegetable broth, garlic, salt and pepper  o Tabouli salad: parsley, bulgur, mint, scallions, cucumbers, tomato, radishes, lemon juice, olive oil, salt and pepper.         Standley Brooking. Shrita Thien M.D.

## 2015-09-25 ENCOUNTER — Ambulatory Visit (INDEPENDENT_AMBULATORY_CARE_PROVIDER_SITE_OTHER): Payer: 59 | Admitting: Podiatry

## 2015-09-25 ENCOUNTER — Encounter: Payer: Self-pay | Admitting: Podiatry

## 2015-09-25 DIAGNOSIS — M2041 Other hammer toe(s) (acquired), right foot: Secondary | ICD-10-CM | POA: Diagnosis not present

## 2015-09-25 DIAGNOSIS — L84 Corns and callosities: Secondary | ICD-10-CM | POA: Diagnosis not present

## 2015-09-27 NOTE — Progress Notes (Signed)
Subjective:     Patient ID: Sharon Wells, female   DOB: January 11, 1961, 54 y.o.   MRN: [id number]  HPI patient presents with painful lesions on the fifth toes of both feet with digital deformities noted   Review of Systems     Objective:   Physical Exam Neurovascular status intact muscle strength adequate with keratotic lesion digit 5 both feet with hammertoe deformity noted as part of the pathology    Assessment:     Hammertoe deformity fifth digit bilateral with keratotic lesion    Plan:     Reviewed hammertoes considerations for surgery and at this time debrided lesions which will be done as needed until that time that surgery may be indicated.

## 2016-01-13 ENCOUNTER — Telehealth: Payer: Self-pay | Admitting: Internal Medicine

## 2016-01-13 ENCOUNTER — Encounter: Payer: Self-pay | Admitting: Family Medicine

## 2016-01-13 ENCOUNTER — Telehealth: Payer: Self-pay | Admitting: Family Medicine

## 2016-01-13 ENCOUNTER — Ambulatory Visit (INDEPENDENT_AMBULATORY_CARE_PROVIDER_SITE_OTHER): Payer: 59 | Admitting: Family Medicine

## 2016-01-13 VITALS — BP 112/70 | HR 86 | Temp 98.9°F | Ht 63.0 in | Wt 152.8 lb

## 2016-01-13 DIAGNOSIS — M546 Pain in thoracic spine: Secondary | ICD-10-CM | POA: Diagnosis not present

## 2016-01-13 DIAGNOSIS — J069 Acute upper respiratory infection, unspecified: Secondary | ICD-10-CM

## 2016-01-13 MED ORDER — CYCLOBENZAPRINE HCL 5 MG PO TABS: 5.0000 mg | ORAL_TABLET | Freq: Two times a day (BID) | ORAL | Status: DC | PRN

## 2016-01-13 NOTE — Progress Notes (Signed)
HPI:  Sharon Wells is a very pleasant 55 year old here for an acute visit for back pain. She reports she has had about 3 days of nasal congestion,coughing, postnasal drip, chills, increased bowel movements, mild upset stomach and some back pain with coughing yesterday. Woke up this morning with right mid to lower back pain. Pain is improved with massage and stretching, pain is moderate and worsened by certain activities. She denies high fevers, radiation of pain, weakness, numbness, bowel or bladder dysfunction, hematuria or history of kidney stones, known flu exposure (however coworkers are sick), shortness of breath, sinus pain, vomiting or diarrhea. She denies a history of back trauma or injury in the past.   ROS: See pertinent positives and negatives per HPI.  Past Medical History  Diagnosis Date  . Fibroids     With history of anemia  . Chicken pox     No past surgical history on file.  Family History  Problem Relation Age of Onset  . Sarcoidosis Mother     Deceased in her 23s  . Hypertension Sister   . Diabetes Sister   . Sarcoidosis Sister   . COPD Sister     Emphysema  . Pulmonary embolism Sister     Apparently am provoked  . Heart disease Maternal Grandmother     Social History   Social History  . Marital Status: Legally Separated    Spouse Name: N/A  . Number of Children: N/A  . Years of Education: N/A   Social History Main Topics  . Smoking status: Never Smoker   . Smokeless tobacco: Never Used  . Alcohol Use: No  . Drug Use: No  . Sexual Activity: Not Asked   Other Topics Concern  . 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     Current outpatient  prescriptions:  .  NON FORMULARY, [facility]: Multivitamin Complex with TRE-EN-EN Grain Concentrates, Disp: , Rfl:  .  NON FORMULARY, [facility]:  Calcium 300 mg, D3 1000 iu and Magnesium 150 mg.  Takes 3 Daily, Disp: , Rfl:  .  cyclobenzaprine (FLEXERIL) 5 MG tablet, Take 1 tablet (5 mg total) by mouth 2 (two) times daily as needed for muscle spasms., Disp: 30 tablet, Rfl: 0  EXAM:  Filed Vitals:   01/13/16 1105  BP: 112/70  Pulse: 86  Temp: 98.9 F (37.2 C)    Body mass index is 27.07 kg/(m^2).  GENERAL: vitals reviewed and listed above, alert, oriented, appears well hydrated and in no acute distress  HEENT: atraumatic, conjunttiva clear, no obvious abnormalities on inspection of external nose and ears, normal appearance of ear canals and TMs, clear nasal congestion, mild post oropharyngeal erythema with PND, no tonsillar edema or exudate, no sinus TTP  NECK: no obvious masses on inspection  LUNGS: clear to auscultation bilaterally, no wheezes, rales or rhonchi, good air movement  CV: HRRR, no peripheral edema  ABD: No CVA tenderness to palpation  MS: moves all extremities without noticeable abnormality, normal gait and body movement, pain is reproducible and is located in the right paraspinal muscles in the lower thoracic spine.  No bony tenderness to palpation. Negatives facet loading.  PSYCH: pleasant and cooperative, no obvious depression or anxiety  ASSESSMENT AND PLAN:  Discussed the following assessment and plan:  Right-sided thoracic back pain -we discussed possible serious and likely etiologies, workup and treatment, treatment risks and return precautions -suspect muscle strain given reproducible findings on exam, possibly from coughing or sleep position -after this discussion, Sharon Wells opted for muscle relaxer, over-the-counter analgesics as needed, topical sports creams, heat and home exercises -follow up advised in 3-4 weeks -of course, we advised Sharon Wells  to return or  notify a doctor immediately if symptoms worsen or persist or new concerns arise.  Acute upper respiratory infection -rapid flu test negative -see patient instructions   Patient Instructions  BEFORE YOU LEAVE: -rapid flu test -upper back exercises -schedule follow up in 3-4 weeks  -Flexeril nightly or up to twice daily as needed for muscle spasm for 1-2 weeks  -Heat for 15 minutes twice daily  -Aleve or Tylenol per instructions as needed for pain  -Do the exercises for the back, follow-up sooner if pain is worsening or new symptoms develop  INSTRUCTIONS FOR UPPER RESPIRATORY INFECTION:  -plenty of rest and fluids  -nasal saline wash 2-3 times daily (use prepackaged nasal saline or bottled/distilled water if making your own)   -can use AFRIN nasal spray for drainage and nasal congestion - but do NOT use longer then 3-4 days  -can use tylenol (in no history of liver disease) or ibuprofen (if no history of kidney disease, bowel bleeding or significant heart disease) as directed for aches and sorethroat  -in the winter time, using a humidifier at night is helpful (please follow cleaning instructions)  -if you are taking a cough medication - use only as directed, may also try a teaspoon of honey to coat the throat and throat lozenges. If given a cough medication with codeine or hydrocodone or other narcotic please be advised that this contains a strong and  potentially addicting medication. Please follow instructions carefully, take as little as possible and only use AS NEEDED for severe cough. Discuss potential side effects with your pharmacy. Please do not drive or operate machinery while taking these types of medications. Please do not take other sedating medications, drugs or alcohol while taking this medication without discussing with your doctor.  -for sore throat, salt water gargles can help  -follow up if you have fevers, facial pain, tooth pain, difficulty breathing or are  worsening or symptoms persist longer then expected  Upper Respiratory Infection, Adult An upper respiratory infection (URI) is also known as the common cold. It is often caused by a type of germ (virus). Colds are easily spread (contagious). You can pass it to others by kissing, coughing, sneezing, or drinking out of the same glass. Usually, you get better in 1 to 3  weeks.  However, the cough can last for even longer. HOME CARE   Only take medicine as told by your doctor. Follow instructions provided above.  Drink enough water and fluids to keep your pee (urine) clear or pale yellow.  Get plenty of rest.  Return to work when your temperature is < 100 for 24 hours or as told by your doctor. You may use a face mask and wash your hands to stop your cold from spreading. GET HELP RIGHT AWAY IF:   After the first few days, you feel you are getting worse.  You have questions about your medicine.  You have chills, shortness of  breath, or red spit (mucus).  You have pain in the face for more then 1-2 days, especially when you bend forward.  You have a fever, puffy (swollen) neck, pain when you swallow, or white spots in the back of your throat.  You have a bad headache, ear pain, sinus pain, or chest pain.  You have a high-pitched whistling sound when you breathe in and out (wheezing).  You cough up blood.  You have sore muscles or a stiff neck. MAKE SURE YOU:   Understand these instructions.  Will watch your condition.  Will get help right away if you are not doing well or get worse. Document Released: 04/18/2008 Document Revised: 01/23/2012 Document Reviewed: 02/05/2014 [facility] Patient Information 2015 [place], [place]. This information is not intended to replace advice given to you by your health care provider. Make sure you discuss any questions you have with your health care provider.        Colin Benton R.

## 2016-01-13 NOTE — Telephone Encounter (Signed)
I called the [facility] on [address] and spoke with [name] and advised her the Rx was sent to [facility] on [address] as this was my error in not changing the pts pharmacy.  [name] states she will pull the Rx and this will be ready for pick up in about 20 minutes.  I called the pt and apologized and informed her of this.

## 2016-01-13 NOTE — Telephone Encounter (Signed)
Patient stated that her medication cyclobenzaprine (FLEXERIL) 5 MG tablet  have not been sent to her pharmacy [facility] on [address]. Patient is in a lot of pain. please advise

## 2016-01-13 NOTE — Patient Instructions (Signed)
BEFORE YOU LEAVE: -rapid flu test -upper back exercises -schedule follow up in 3-4 weeks  -Flexeril nightly or up to twice daily as needed for muscle spasm for 1-2 weeks  -Heat for 15 minutes twice daily  -Aleve or Tylenol per instructions as needed for pain  -Do the exercises for the back, follow-up sooner if pain is worsening or new symptoms develop  INSTRUCTIONS FOR UPPER RESPIRATORY INFECTION:  -plenty of rest and fluids  -nasal saline wash 2-3 times daily (use prepackaged nasal saline or bottled/distilled water if making your own)   -can use AFRIN nasal spray for drainage and nasal congestion - but do NOT use longer then 3-4 days  -can use tylenol (in no history of liver disease) or ibuprofen (if no history of kidney disease, bowel bleeding or significant heart disease) as directed for aches and sorethroat  -in the winter time, using a humidifier at night is helpful (please follow cleaning instructions)  -if you are taking a cough medication - use only as directed, may also try a teaspoon of honey to coat the throat and throat lozenges. If given a cough medication with codeine or hydrocodone or other narcotic please be advised that this contains a strong and  potentially addicting medication. Please follow instructions carefully, take as little as possible and only use AS NEEDED for severe cough. Discuss potential side effects with your pharmacy. Please do not drive or operate machinery while taking these types of medications. Please do not take other sedating medications, drugs or alcohol while taking this medication without discussing with your doctor.  -for sore throat, salt water gargles can help  -follow up if you have fevers, facial pain, tooth pain, difficulty breathing or are worsening or symptoms persist longer then expected  Upper Respiratory Infection, Adult An upper respiratory infection (URI) is also known as the common cold. It is often caused by a type of germ  (virus). Colds are easily spread (contagious). You can pass it to others by kissing, coughing, sneezing, or drinking out of the same glass. Usually, you get better in 1 to 3  weeks.  However, the cough can last for even longer. HOME CARE   Only take medicine as told by your doctor. Follow instructions provided above.  Drink enough water and fluids to keep your pee (urine) clear or pale yellow.  Get plenty of rest.  Return to work when your temperature is < 100 for 24 hours or as told by your doctor. You may use a face mask and wash your hands to stop your cold from spreading. GET HELP RIGHT AWAY IF:   After the first few days, you feel you are getting worse.  You have questions about your medicine.  You have chills, shortness of breath, or red spit (mucus).  You have pain in the face for more then 1-2 days, especially when you bend forward.  You have a fever, puffy (swollen) neck, pain when you swallow, or white spots in the back of your throat.  You have a bad headache, ear pain, sinus pain, or chest pain.  You have a high-pitched whistling sound when you breathe in and out (wheezing).  You cough up blood.  You have sore muscles or a stiff neck. MAKE SURE YOU:   Understand these instructions.  Will watch your condition.  Will get help right away if you are not doing well or get worse. Document Released: 04/18/2008 Document Revised: 01/23/2012 Document Reviewed: 02/05/2014 [facility] Patient Information 2015 [place], [place]. This  information is not intended to replace advice given to you by your health care provider. Make sure you discuss any questions you have with your health care provider.

## 2016-01-13 NOTE — Progress Notes (Signed)
Pre visit review using our clinic review tool, if applicable. No additional management support is needed unless otherwise documented below in the visit note. 

## 2016-01-15 NOTE — Telephone Encounter (Signed)
error 

## 2016-01-26 ENCOUNTER — Telehealth: Payer: Self-pay | Admitting: Internal Medicine

## 2016-01-26 MED ORDER — METHOCARBAMOL 500 MG PO TABS: 500.0000 mg | ORAL_TABLET | Freq: Every evening | ORAL | Status: DC | PRN

## 2016-01-26 NOTE — Telephone Encounter (Signed)
Pt saw dr Maudie Mercury on 01-13-16. 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 [facility] [address] in [place]

## 2016-01-26 NOTE — Telephone Encounter (Signed)
Sent 5 refills per her request. Advised follow-up with her PCP if symptoms persist.

## 2016-01-27 NOTE — Telephone Encounter (Signed)
Patient informed. 

## 2016-02-09 ENCOUNTER — Ambulatory Visit: Payer: Self-pay | Admitting: Internal Medicine

## 2016-07-12 ENCOUNTER — Telehealth: Payer: Self-pay | Admitting: Internal Medicine

## 2016-07-12 NOTE — Telephone Encounter (Signed)
Pt would like to go to [facility] for her cpe labs, pt has cpe 10/25. Can you put the order in?

## 2016-07-13 ENCOUNTER — Other Ambulatory Visit: Payer: Self-pay | Admitting: Family Medicine

## 2016-07-13 DIAGNOSIS — Z0000 Encounter for general adult medical examination without abnormal findings: Secondary | ICD-10-CM

## 2016-07-13 NOTE — Telephone Encounter (Signed)
Left a message on identified voicemail informing the pt that I have ordered lab work for [facility] office.  Call back if any questions.

## 2016-08-10 DIAGNOSIS — Z6828 Body mass index (BMI) 28.0-28.9, adult: Secondary | ICD-10-CM | POA: Diagnosis not present

## 2016-08-10 DIAGNOSIS — Z1231 Encounter for screening mammogram for malignant neoplasm of breast: Secondary | ICD-10-CM | POA: Diagnosis not present

## 2016-08-10 DIAGNOSIS — Z01419 Encounter for gynecological examination (general) (routine) without abnormal findings: Secondary | ICD-10-CM | POA: Diagnosis not present

## 2016-08-10 DIAGNOSIS — Z1151 Encounter for screening for human papillomavirus (HPV): Secondary | ICD-10-CM | POA: Diagnosis not present

## 2016-08-29 ENCOUNTER — Telehealth: Payer: Self-pay | Admitting: Internal Medicine

## 2016-08-29 NOTE — Telephone Encounter (Signed)
Pt wants to go to [facility] for her cpx labs. (instead of [facility]) She has checked and they will cover. Pt works right down the hall from them. Can you fax the order to [phone]  Pt wants to go in the am.

## 2016-08-30 ENCOUNTER — Encounter: Payer: Self-pay | Admitting: Internal Medicine

## 2016-08-30 DIAGNOSIS — Z7689 Persons encountering health services in other specified circumstances: Secondary | ICD-10-CM | POA: Diagnosis not present

## 2016-08-30 NOTE — Telephone Encounter (Signed)
Lab order has been faxed this am and confirmation received. Pt aware.

## 2016-09-06 NOTE — Progress Notes (Signed)
Pre visit review using our clinic review tool, if applicable. No additional management support is needed unless otherwise documented below in the visit note.  Chief Complaint  Patient presents with  . Annual Exam    HPI: Patient  Sharon Wells  55 y.o. comes in today for [place] visit  utd on  hcm  Avoid flu   inj because of untoward side effect.  Health Maintenance  Topic Date Due  . Hepatitis C Screening  09/06/2017 (Originally [birth year])  . HIV Screening  09/06/2017 (Originally 10/19/1976)  . INFLUENZA VACCINE  09/01/2049 (Originally 06/14/2016)  . PAP SMEAR  05/08/2017  . MAMMOGRAM  07/15/2018  . COLONOSCOPY  11/14/2022  . TETANUS/TDAP  05/08/2023   Health Maintenance Review LIFESTYLE:  Exercise:   Some not as much   recnetly   Used pedometer Tobacco/ETS:n Alcohol:   no Sugar beverages:  Tea  Honey  Tries to eat honey but some candy carbs Sleep:  6+ hours   ocass 4 am  Drug use: no HH of   3 Work:  40 + 60    ROS:  GEN/ HEENT: No fever, significant weight changes sweats headaches vision problems hearing changes, CV/ PULM; No chest pain shortness of breath cough, syncope,edema  change in exercise tolerance. GI /GU: No adominal pain, vomiting, change in bowel habits. No blood in the stool. No significant GU symptoms. SKIN/HEME: ,no acute skin rashes suspicious lesions or bleeding. No lymphadenopathy, nodules, masses.  NEURO/ PSYCH:  No neurologic signs such as weakness numbness. No depression anxiety. IMM/ Allergy: No unusual infections.  Allergy .   REST of 12 system review negative except as per HPI   Past Medical History:  Diagnosis Date  . Chicken pox   . Fibroids    With history of anemia    No past surgical history on file.  Family History  Problem Relation Age of Onset  . Sarcoidosis Mother     Deceased in her 38s  . Hypertension Sister   . Diabetes Sister   . Sarcoidosis Sister   . COPD Sister     Emphysema  . Pulmonary embolism  Sister     Apparently am provoked  . Heart disease Maternal Grandmother     Social History   Social History  . Marital status: Legally Separated    Spouse name: N/A  . Number of children: N/A  . Years of education: N/A   Social History Main Topics  . Smoking status: Never Smoker  . Smokeless tobacco: Never Used  . Alcohol use No  . Drug use: No  . Sexual activity: Not Asked   Other Topics Concern  . 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    Outpatient Medications Prior to Visit  Medication Sig Dispense Refill  . NON FORMULARY [employer]: Multivitamin Complex with TRE-EN-EN Grain Concentrates    . NON FORMULARY [employer]:  Calcium 300 mg, D3 1000 iu and Magnesium 150 mg.  Takes 3 Daily    . cyclobenzaprine (FLEXERIL) 5 MG tablet Take 1 tablet (5 mg total) by mouth 2 (two) times  daily as needed for muscle spasms. 30 tablet 0  . methocarbamol (ROBAXIN) 500 MG tablet Take 1 tablet (500 mg total) by mouth at bedtime as needed for muscle spasms. 5 tablet 0   No facility-administered medications prior to visit.      EXAM:  BP 138/78 (BP Location: Right Arm)   Temp 98.2 F (36.8 C) (Oral)   Ht 5\' 3"  (1.6 m)   Wt 157 lb 9.6 oz (71.5 kg)   BMI 27.92 kg/m   Body mass index is 27.92 kg/m.  Physical Exam: Vital signs reviewed [id number] is a well-developed well-nourished alert cooperative    who appearsr stated age in no acute distress.  HEENT: normocephalic atraumatic , Eyes: PERRL EOM's full, conjunctiva clear, Nares: paten,t no deformity discharge or tenderness., Ears: no deformity EAC's clear TMs with normal landmarks. Mouth: clear OP, no lesions, edema.  Moist mucous  membranes. Dentition in adequate repair. NECK: supple without masses, thyromegaly or bruits. CHEST/PULM:  Clear to auscultation and percussion breath sounds equal no wheeze , rales or rhonchi. No chest wall deformities or tenderness.Breast: normal by inspection . No dimpling, discharge, masses, tenderness or discharge . CV: PMI is nondisplaced, S1 S2 no gallops, murmurs, rubs. Peripheral pulses are full without delay.No JVD .  ABDOMEN: Bowel sounds normal nontender  No guard or rebound, no hepato splenomegal no CVA tenderness.  No hernia. Extremtities:  No clubbing cyanosis or edema, no acute joint swelling or redness no focal atrophy NEURO:  Oriented x3, cranial nerves 3-12 appear to be intact, no obvious focal weakness,gait within normal limits no abnormal reflexes or asymmetrical SKIN: No acute rashes normal turgor, color, no bruising or petechiae. PSYCH: Oriented, good eye contact, no obvious depression anxiety, cognition and judgment appear normal. LN: no cervical axillary inguinal adenopathy  Lab Results  Component Value Date   WBC 3.8 (L) 07/17/2015   HGB 12.7 07/17/2015   HCT 38.1 07/17/2015   PLT 296.0 07/17/2015   GLUCOSE 94 07/17/2015   CHOL 262 (H) 07/17/2015   TRIG 179.0 (H) 07/17/2015   HDL 42.40 07/17/2015   LDLCALC 183 (H) 07/17/2015   ALT 23 07/17/2015   AST 19 07/17/2015   NA 141 07/17/2015   K 4.1 07/17/2015   CL 105 07/17/2015   CREATININE 0.58 07/17/2015   BUN 12 07/17/2015   CO2 28 07/17/2015   TSH 1.06 07/17/2015  out side lab  tc278 tg 205 ldl 196 hdl41 fbs 109 rest normal  ASSESSMENT AND PLAN:  Discussed the following assessment and plan:  Visit for preventive health examination  Hyperlipidemia, unspecified hyperlipidemia type - Plan: Lipid panel  Fasting hyperglycemia - Plan: Hemoglobin A1c Repeat    Lipid  And do hg a1c at 3 months .   Lipids higher than last year and doesn want to take meds at this time  Discussed with the patient and all  questioned fully answered. She will call me if any problems arise. islsi and recheck 3 months consider seeing dietician other  Check bp at home to ensure at goal  Patient Care Team: Burnis Medin, MD as PCP - General (Internal Medicine) Princess Bruins, MD as Consulting Physician (Obstetrics and Gynecology) Patient Instructions  Intensify lifestyle interventions. GET LABS LIPID AND HG A1C in   3-4 months at the [facility] office  Lab    And then decide any other intervneiton or help  Can view results on my chart also .      Standley Brooking. Cahlil Sattar M.D.

## 2016-09-07 ENCOUNTER — Encounter: Payer: Self-pay | Admitting: Internal Medicine

## 2016-09-07 ENCOUNTER — Ambulatory Visit (INDEPENDENT_AMBULATORY_CARE_PROVIDER_SITE_OTHER): Payer: 59 | Admitting: Internal Medicine

## 2016-09-07 VITALS — BP 138/78 | Temp 98.2°F | Ht 63.0 in | Wt 157.6 lb

## 2016-09-07 DIAGNOSIS — Z0000 Encounter for general adult medical examination without abnormal findings: Secondary | ICD-10-CM

## 2016-09-07 DIAGNOSIS — R7301 Impaired fasting glucose: Secondary | ICD-10-CM

## 2016-09-07 DIAGNOSIS — E785 Hyperlipidemia, unspecified: Secondary | ICD-10-CM | POA: Diagnosis not present

## 2016-09-07 NOTE — Patient Instructions (Signed)
Intensify lifestyle interventions. GET LABS LIPID AND HG A1C in   3-4 months at the [facility] office  Lab    And then decide any other intervneiton or help  Can view results on my chart also .

## 2016-09-13 ENCOUNTER — Encounter: Payer: Self-pay | Admitting: Podiatry

## 2016-09-13 ENCOUNTER — Ambulatory Visit (INDEPENDENT_AMBULATORY_CARE_PROVIDER_SITE_OTHER): Payer: 59 | Admitting: Podiatry

## 2016-09-13 DIAGNOSIS — L84 Corns and callosities: Secondary | ICD-10-CM

## 2016-09-13 NOTE — Progress Notes (Signed)
Subjective: 55 year old female presents to the office today for concerns of painful corns to both of her fifth toes become painful pressure in shoe gear. She states that she started were closed shoes it has started to hurt more. Denies any redness or drainage. Denies any systemic complaints such as fevers, chills, nausea, vomiting. No acute changes since last appointment, and no other complaints at this time.   Objective: AAO x3, NAD DP/PT pulses palpable bilaterally, CRT less than 3 seconds Adductovarus of the fifth toes. On the dorsal lateral PIPJ is a hyperkeratotic lesion. Upon debridement no underlying ulceration, drainage or any signs of infection. Right side worse left.  No open lesions or pre-ulcerative lesions.  No pain with calf compression, swelling, warmth, erythema  Assessment: Hyperkeratotic lesions bilateral fifth digits  Plan: -All treatment options discussed with the patient including all alternatives, risks, complications.  -Lesions debrided 2 without complication or bleeding. Offloading pads were dispensed. Discussed shoe changes. -Follow-up if symptoms recur. -Patient encouraged to call the office with any questions, concerns, change in symptoms.   Celesta Gentile, DPM

## 2016-12-03 DIAGNOSIS — H5213 Myopia, bilateral: Secondary | ICD-10-CM | POA: Diagnosis not present

## 2016-12-26 NOTE — Progress Notes (Signed)
Pre visit review using our clinic review tool, if applicable. No additional management support is needed unless otherwise documented below in the visit note.  Chief Complaint  Patient presents with  . High Blood Pressure Readings    HPI: Sharon Wells 56 y.o.  Concern about BP   Se last visit and  Discussion Earlier this week noted some bulging veins in stress and this sided to check by her monitor for blood pressure. She had been checking it occasionally since then she's getting 160s and occasional higher 140/90 this morning. Comes in to discuss blood pressure medicine that she is aware of risk and hypertension guidelines. She has been doing lifestyle intervention in his left 6 pounds has not had follow-up blood work yet. Prefers to go on diuretic with add on CCB if medicines advised. She does have her blood pressure monitor today. ROS: See pertinent positives and negatives per HPI.  Past Medical History:  Diagnosis Date  . Chicken pox   . Fibroids    With history of anemia    Family History  Problem Relation Age of Onset  . Sarcoidosis Mother     Deceased in her 55s  . Hypertension Sister   . Diabetes Sister   . Sarcoidosis Sister   . COPD Sister     Emphysema  . Pulmonary embolism Sister     Apparently am provoked  . Heart disease Maternal Grandmother     Social History   Social History  . Marital status: Legally Separated    Spouse name: N/A  . Number of children: N/A  . Years of education: N/A   Social History Main Topics  . Smoking status: Never Smoker  . Smokeless tobacco: Never Used  . Alcohol use No  . Drug use: No  . Sexual activity: Not Asked   Other Topics Concern  . 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    Outpatient Medications Prior to Visit  Medication Sig Dispense Refill  . NON FORMULARY [employer]: Multivitamin Complex with TRE-EN-EN Grain Concentrates    . NON FORMULARY [employer]:  Calcium 300 mg, D3 1000 iu and Magnesium 150 mg.  Takes 3 Daily     No facility-administered medications prior to visit.      EXAM:  BP (!) 186/100 (BP Location: Right Arm, Patient Position: Sitting, Cuff Size: Normal)   Temp 98.2 F (36.8 C) (Oral)   Wt 151 lb (68.5 kg)   BMI 26.75 kg/m   Body mass index is 26.75 kg/m.  GENERAL: vitals reviewed and listed above, alert, oriented, appears well hydrated and in no acute distress no JVD is seen. CV: HRRR, no clubbing cyanosis or  peripheral edema nl cap refill  MS: moves all extremities without noticeable focal  abnormality PSYCH: pleasant and cooperative, no obvious depression or anxiety BP Readings from Last 3 Encounters:  12/29/16 (!) 186/100  09/07/16 138/78  01/13/16 112/70   Wt Readings from Last 3 Encounters:  12/29/16 151 lb (68.5 kg)  09/07/16 157 lb 9.6 oz (71.5 kg)  01/13/16 152 lb 12.8 oz (69.3 kg)   Lab  Results  Component Value Date   WBC 3.8 (L) 07/17/2015   HGB 12.7 07/17/2015   HCT 38.1 07/17/2015   PLT 296.0 07/17/2015   GLUCOSE 94 07/17/2015   CHOL 262 (H) 07/17/2015   TRIG 179.0 (H) 07/17/2015   HDL 42.40 07/17/2015   LDLCALC 183 (H) 07/17/2015   ALT 23 07/17/2015   AST 19 07/17/2015   NA 141 07/17/2015   K 4.1 07/17/2015   CL 105 07/17/2015   CREATININE 0.58 07/17/2015   BUN 12 07/17/2015   CO2 28 07/17/2015   TSH 1.06 07/17/2015    Wt Readings from Last 3 Encounters:  12/29/16 151 lb (68.5 kg)  09/07/16 157 lb 9.6 oz (71.5 kg)  01/13/16 152 lb 12.8 oz (69.3 kg)   BP Readings from Last 3 Encounters:  12/29/16 (!) 186/100  09/07/16 138/78  01/13/16 112/70   Blood pressure left arm her monitor 164/80. Left arm office monitor cough  168/80. Repeat right arm 162/92. ASSESSMENT AND PLAN:  Discussed the following assessment and plan:  Essential hypertension - Patient monitor adequate to make decisions can send in readings on my chart see text - Plan: Basic metabolic panel  Hyperlipidemia, unspecified hyperlipidemia type  Fasting hyperglycemia - Plan: Basic metabolic panel Due for labs  Lipid a1c  Wait for a month and BMP to the lab orders Discussed medication she will probably benefit from combination medicine however okay to begin HCTZ Dyazide treatments. Continue lifestyle changes also. Her machine is good enough to make decisions and she can send in readings my chart and how she is doing. -Patient advised to return or notify health care team  if symptoms worsen ,persist or new concerns arise. Total visit 25mins > 50% spent counseling and coordinating care as indicated in above note and in instructions to patient .   Patient Instructions  Begin diuretic  May not be powerful enough for total control .   But a beginning .  Take med  Daily   Plan lab studies   BMP lipids and HG a1c   In a month  (will be adding the bmp  order to the prev ordered lab. )  continue monitor If not below 160 in the next 2 weeks contact us for  Other med add on etc .   Bp today  164/96 range .    Your machine  Is accurate enough   For now .   SEND IN READINGS in 2 weeks  My chart .   ROV in 2 months or as needed .      Standley Brooking. Sharon Wells M.D.

## 2016-12-29 ENCOUNTER — Encounter: Payer: Self-pay | Admitting: Podiatry

## 2016-12-29 ENCOUNTER — Encounter: Payer: Self-pay | Admitting: Internal Medicine

## 2016-12-29 ENCOUNTER — Ambulatory Visit (INDEPENDENT_AMBULATORY_CARE_PROVIDER_SITE_OTHER): Payer: 59 | Admitting: Podiatry

## 2016-12-29 ENCOUNTER — Ambulatory Visit (INDEPENDENT_AMBULATORY_CARE_PROVIDER_SITE_OTHER): Payer: 59 | Admitting: Internal Medicine

## 2016-12-29 VITALS — BP 186/100 | Temp 98.2°F | Wt 151.0 lb

## 2016-12-29 DIAGNOSIS — L84 Corns and callosities: Secondary | ICD-10-CM

## 2016-12-29 DIAGNOSIS — M779 Enthesopathy, unspecified: Secondary | ICD-10-CM | POA: Diagnosis not present

## 2016-12-29 DIAGNOSIS — I10 Essential (primary) hypertension: Secondary | ICD-10-CM | POA: Diagnosis not present

## 2016-12-29 DIAGNOSIS — R7301 Impaired fasting glucose: Secondary | ICD-10-CM

## 2016-12-29 DIAGNOSIS — E785 Hyperlipidemia, unspecified: Secondary | ICD-10-CM

## 2016-12-29 MED ORDER — TRIAMCINOLONE ACETONIDE 10 MG/ML IJ SUSP
10.0000 mg | Freq: Once | INTRAMUSCULAR | Status: AC
  Administered 2016-12-29: 10 mg

## 2016-12-29 MED ORDER — TRIAMTERENE-HCTZ 37.5-25 MG PO TABS: 1.0000 | ORAL_TABLET | Freq: Every day | ORAL | 1 refills | Status: DC

## 2016-12-29 NOTE — Patient Instructions (Addendum)
Begin diuretic  May not be powerful enough for total control .   But a beginning .  Take med  Daily   Plan lab studies   BMP lipids and HG a1c   In a month  (will be adding the bmp  order to the prev ordered lab. )  continue monitor If not below 160 in the next 2 weeks contact us for  Other med add on etc .   Bp today  164/96 range .    Your machine  Is accurate enough   For now .   SEND IN READINGS in 2 weeks  My chart .   ROV in 2 months or as needed .

## 2016-12-29 NOTE — Progress Notes (Signed)
Subjective:     Patient ID: Sharon Wells, female   DOB: 1961/02/23, 56 y.o.   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

## 2016-12-30 MED FILL — TRIAMTERENE-HCTZ 37.5-25 MG: 37.5-25 | 90 days supply | Qty: 90 | Fill #0

## 2017-01-15 ENCOUNTER — Encounter: Payer: Self-pay | Admitting: Internal Medicine

## 2017-01-23 ENCOUNTER — Telehealth: Payer: Self-pay | Admitting: Internal Medicine

## 2017-01-23 NOTE — Telephone Encounter (Signed)
° ° ° °  Pt call to say she sent Dr Regis Bill a email and is asking for a call back

## 2017-01-26 NOTE — Telephone Encounter (Signed)
Spoke with pt and she would like Dr.Panosh recommendations on blood pressure readings and also her medication. HTZ. Forward pt e-mail to Dr. Regis Bill and told patient that I would give her a call back as soon as Dr. Regis Bill replied back to the message. Pt states that it is okay to leave a detailed message

## 2017-02-20 NOTE — Telephone Encounter (Signed)
Please advise 

## 2017-02-20 NOTE — Telephone Encounter (Signed)
What  are her BP readings   In the last 2 weeks  ?  Did they get better  ?  Did she stop the  Medication for a reason  ? Cant tell from the record.  (I  am surprised  about a   200 $ copay and will try to work with her on this . To get her help    She may want to check with her insurance as to th reason for this  As it doesn't sound correct . )  Depending on above  We may still need future lab tests. Or add a different medication.        Below is my last assessment and advice  Essential hypertension - Patient monitor adequate to make decisions can send in readings on my chart see text - Plan: Basic metabolic panel  Hyperlipidemia, unspecified hyperlipidemia type  Fasting hyperglycemia - Plan: Basic metabolic panel Due for labs  Lipid a1c  Wait for a month and BMP to the lab orders Discussed medication she will probably benefit from combination medicine however okay to begin HCTZ Dyazide treatments. Continue lifestyle changes also. Her machine is good enough to make decisions and she can send in readings my chart and how she is doing. -Patient advised to return or notify health care team  if symptoms worsen ,persist or new concerns arise. Total visit 30mins > 50% spent counseling and coordinating care as indicated in above note and in instructions to patient .   Patient Instructions  Begin diuretic  May not be powerful enough for total control .   But a beginning .  Take med  Daily   Plan lab studies   BMP lipids and HG a1c   In a month  (will be adding the bmp  order to the prev ordered lab. )  continue monitor If not below 160 in the next 2 weeks contact us for  Other med add on etc .   Bp today  164/96 range .    Your machine  Is accurate enough   For now .   SEND IN READINGS in 2 weeks  My chart .   ROV in 2 months or as needed .    Lab Results  Component Value Date   WBC 3.8 (L) 07/17/2015   HGB 12.7 07/17/2015   HCT 38.1 07/17/2015   PLT 296.0 07/17/2015   GLUCOSE 94 07/17/2015   CHOL 262 (H) 07/17/2015   TRIG 179.0 (H) 07/17/2015   HDL 42.40 07/17/2015   LDLCALC 183 (H) 07/17/2015   ALT 23 07/17/2015   AST 19 07/17/2015   NA 141 07/17/2015   K 4.1 07/17/2015   CL 105 07/17/2015   CREATININE 0.58 07/17/2015   BUN 12 07/17/2015   CO2 28 07/17/2015   TSH 1.06 07/17/2015

## 2017-02-20 NOTE — Telephone Encounter (Signed)
Pt is calling back and would like md to know she will no longer take HCTZ. Pt has an appointment on 02-27-17 and does not want to kept due to paying another 200.00 copay. Please call pt and leave detail message on cell or by [facility]

## 2017-02-20 NOTE — Telephone Encounter (Signed)
Left a voicemail for pt to give the office a call back

## 2017-02-21 NOTE — Telephone Encounter (Signed)
Spoke with pt and she explains that she is not taking the medication anymore because she feels her BP is a lot better.Also thinks her BP was elevated due to her job. She has been eating right and excersings and has lost over 10 pounds. Pt is not against coming in for an OV but if she can avoid that would be great due to her expensive co payment. Pt would like advice on what Dr Regis Bill thinks would be the next step.   4/2 134/82 4/5 139/83 4/6 136/86 4/7 132/89 4/10 139/82

## 2017-02-21 NOTE — Telephone Encounter (Signed)
I'm glad your blood pressure readings are improved. However the new goals are closer to 120/80. We should consider taking a half of the diuretic pill once a day to get it down to the 120/80 range. If she is willing have her begin this at half a pill a day dyazide     and after a month send  in readings. fo further instructions  We still will need some blood work monitoring  including bmp  At that time   and then plan follow-up.

## 2017-02-21 NOTE — Telephone Encounter (Signed)
Pt would like you to giver her a call asap.  She is on lunch.  If cannot do that, please leave detailed message on voice mail.

## 2017-02-21 NOTE — Telephone Encounter (Signed)
Left pt a voicemail to get some more information on why she stop taking the medication and if the medication helped or made the BP readings worse.

## 2017-02-22 NOTE — Telephone Encounter (Signed)
Spoke with pt and she agreed to take the half of the BP medication to see if her BP would come down to 120/80's. Rescheduled pt appointment per Dr. Regis Bill for a month out so she could monitor her blood pressure readings. Pt will still have labs done prior to appointment. Nothing further needed at this time .

## 2017-02-22 NOTE — Telephone Encounter (Signed)
Left a voicemail for to give the office a call back. Also left a detail message on machine

## 2017-02-27 ENCOUNTER — Ambulatory Visit: Payer: Self-pay | Admitting: Internal Medicine

## 2017-03-28 ENCOUNTER — Ambulatory Visit: Payer: 59 | Admitting: Internal Medicine

## 2017-04-29 NOTE — Progress Notes (Signed)
Chief Complaint  Patient presents with  . Follow-up    HPI: Sharon Wells 56 y.o.   Fu  Of ht bp  Seen feb 18  Not taking medication at this time.  Healthy weight  with cone.  Not using med . Took 1/2 per day .  Med    Off and on But not for a full 4-6 weeks. Blood pressures at home in the 1:30 over 80s range 11/14/2018 some closer to 140. Working on trying to lose weight if the slow progress. Had no side effects of the diuretic but isn't on it now. No cp sob  Sx  ROS: See pertinent positives and negatives per HPI.  Past Medical History:  Diagnosis Date  . Chicken pox   . Fibroids    With history of anemia    Family History  Problem Relation Age of Onset  . Sarcoidosis Mother        Deceased in her 87s  . Hypertension Sister   . Diabetes Sister   . Sarcoidosis Sister   . COPD Sister        Emphysema  . Pulmonary embolism Sister        Apparently am provoked  . Heart disease Maternal Grandmother     Social History   Social History  . Marital status: Legally Separated    Spouse name: N/A  . Number of children: N/A  . Years of education: N/A   Social History Main Topics  . Smoking status: Never Smoker  . Smokeless tobacco: Never Used  . Alcohol use No  . Drug use: No  . Sexual activity: Not Asked   Other Topics Concern  . 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    Outpatient Medications Prior to Visit  Medication Sig Dispense Refill  . NON FORMULARY [employer]: Multivitamin Complex with TRE-EN-EN Grain Concentrates    . cyanocobalamin 2000 MCG tablet Take 2,000 mcg by mouth daily.    . NON FORMULARY [employer]:  Calcium 300 mg, D3 1000 iu and Magnesium 150  mg.  Takes 3 Daily    . triamterene-hydrochlorothiazide (MAXZIDE-25) 37.5-25 MG tablet Take 1 tablet by mouth daily. (Patient not taking: Reported on 05/02/2017) 90 tablet 1   No facility-administered medications prior to visit.      EXAM:  BP (!) 142/86   Pulse 70   Temp 98.3 F (36.8 C) (Oral)   Wt 149 lb 12.8 oz (67.9 kg)   BMI 26.54 kg/m   Body mass index is 26.54 kg/m.  GENERAL: vitals reviewed and listed above, alert, oriented, appears well hydrated and in no acute distress s CV: HRRR, no clubbing cyanosis or  peripheral edema nl cap refill   MS: moves all extremities without noticeable focal  abnormality PSYCH: pleasant and cooperative, no obvious depression or anxiety BP Readings from Last 3 Encounters:  05/02/17 (!) 142/86  12/29/16 (!) 186/100  09/07/16 138/78   Wt Readings from Last 3 Encounters:  05/02/17 149 lb 12.8 oz (67.9 kg)  12/29/16 151 lb (68.5 kg)  09/07/16 157 lb 9.6 oz (71.5 kg)  Lab Results  Component Value Date   WBC 3.8 (L) 07/17/2015   HGB 12.7 07/17/2015   HCT 38.1 07/17/2015   PLT 296.0 07/17/2015   GLUCOSE 94 07/17/2015   CHOL 262 (H) 07/17/2015   TRIG 179.0 (H) 07/17/2015   HDL 42.40 07/17/2015   LDLCALC 183 (H) 07/17/2015   ALT 23 07/17/2015   AST 19 07/17/2015   NA 141 07/17/2015   K 4.1 07/17/2015   CL 105 07/17/2015   CREATININE 0.58 07/17/2015   BUN 12 07/17/2015   CO2 28 07/17/2015   TSH 1.06 07/17/2015    ASSESSMENT AND PLAN:  Discussed the following assessment and plan:  Essential hypertension - Plan: Lipid panel, Basic metabolic panel, Lipid panel, Basic metabolic panel  Medication management  Hyperglycemia - Plan: Lipid panel, Hemoglobin A1c, Lipid panel, Hemoglobin A1c  Hyperlipidemia, unspecified hyperlipidemia type - Plan: CBC with Differential/Platelet, TSH, Hepatic function panel Labs never got done at the [place] office can do today lipid panel and A1c and BMP.today  -Patient advised to return or notify  health care team  if symptoms worsen ,persist or new concerns arise.  Patient Instructions  Newer goals are lower for better CV health.   But  Individualized    When to begin medication.      Take readings   3 days  In a row twice a day.   And if not  Below 140/90 or close then begin  medication for at least 6 weeks and send in readings at that time.   ROV in 6 months or earlier  .  As needed.   Get labs today  Repeat bp was 142/86    Standley Brooking. Margareta Laureano M.D.

## 2017-05-02 ENCOUNTER — Ambulatory Visit (INDEPENDENT_AMBULATORY_CARE_PROVIDER_SITE_OTHER): Payer: 59 | Admitting: Internal Medicine

## 2017-05-02 ENCOUNTER — Encounter: Payer: Self-pay | Admitting: Internal Medicine

## 2017-05-02 VITALS — BP 142/86 | HR 70 | Temp 98.3°F | Wt 149.8 lb

## 2017-05-02 DIAGNOSIS — I10 Essential (primary) hypertension: Secondary | ICD-10-CM | POA: Diagnosis not present

## 2017-05-02 DIAGNOSIS — Z79899 Other long term (current) drug therapy: Secondary | ICD-10-CM | POA: Diagnosis not present

## 2017-05-02 DIAGNOSIS — E785 Hyperlipidemia, unspecified: Secondary | ICD-10-CM

## 2017-05-02 DIAGNOSIS — R739 Hyperglycemia, unspecified: Secondary | ICD-10-CM

## 2017-05-02 LAB — BASIC METABOLIC PANEL
BUN: 16 mg/dL (ref 6–23)
CALCIUM: 10 mg/dL (ref 8.4–10.5)
CHLORIDE: 101 meq/L (ref 96–112)
CO2: 30 mEq/L (ref 19–32)
CREATININE: 0.62 mg/dL (ref 0.40–1.20)
GFR: 127.36 mL/min (ref >60.00)
Glucose, Bld: 99 mg/dL (ref 70–99)
Potassium: 4.1 mEq/L (ref 3.5–5.1)
Sodium: 139 mEq/L (ref 135–145)

## 2017-05-02 LAB — CBC WITH DIFFERENTIAL/PLATELET
BASOS ABS: 0 10*3/uL (ref 0.0–0.1)
Basophils Relative: 1.3 % (ref 0.0–3.0)
Eosinophils Absolute: 0 10*3/uL (ref 0.0–0.7)
Eosinophils Relative: 1 % (ref 0.0–5.0)
HCT: 38.7 % (ref 36.0–46.0)
HEMOGLOBIN: 12.8 g/dL (ref 12.0–15.0)
LYMPHS ABS: 1.5 10*3/uL (ref 0.7–4.0)
Lymphocytes Relative: 41.8 % (ref 12.0–46.0)
MCHC: 33.1 g/dL (ref 30.0–36.0)
MCV: 85.3 fl (ref 78.0–100.0)
MONO ABS: 0.2 10*3/uL (ref 0.1–1.0)
Monocytes Relative: 6.5 % (ref 3.0–12.0)
NEUTROS PCT: 49.4 % (ref 43.0–77.0)
Neutro Abs: 1.8 10*3/uL (ref 1.4–7.7)
Platelets: 291 10*3/uL (ref 150.0–400.0)
RBC: 4.54 Mil/uL (ref 3.87–5.11)
RDW: 13.1 % (ref 11.5–15.5)
WBC: 3.6 10*3/uL — AB (ref 4.0–10.5)

## 2017-05-02 LAB — TSH: TSH: 1.22 u[IU]/mL (ref 0.35–4.50)

## 2017-05-02 LAB — HEPATIC FUNCTION PANEL
ALK PHOS: 74 U/L (ref 39–117)
ALT: 12 U/L (ref 0–35)
AST: 13 U/L (ref 0–37)
Albumin: 4.7 g/dL (ref 3.5–5.2)
BILIRUBIN DIRECT: 0.1 mg/dL (ref 0.0–0.3)
BILIRUBIN TOTAL: 0.7 mg/dL (ref 0.2–1.2)
Total Protein: 7.4 g/dL (ref 6.0–8.3)

## 2017-05-02 LAB — HEMOGLOBIN A1C: Hgb A1c MFr Bld: 6 % (ref 4.6–6.5)

## 2017-05-02 LAB — LIPID PANEL
CHOL/HDL RATIO: 6
Cholesterol: 237 mg/dL — ABNORMAL HIGH (ref 0–200)
HDL: 41.3 mg/dL (ref >39.00)
LDL CALC: 160 mg/dL — AB (ref 0–99)
NONHDL: 195.7
Triglycerides: 177 mg/dL — ABNORMAL HIGH (ref 0.0–149.0)
VLDL: 35.4 mg/dL (ref 0.0–40.0)

## 2017-05-02 NOTE — Patient Instructions (Addendum)
Newer goals are lower for better CV health.   But  Individualized    When to begin medication.      Take readings   3 days  In a row twice a day.   And if not  Below 140/90 or close then begin  medication for at least 6 weeks and send in readings at that time.   ROV in 6 months or earlier  .  As needed.   Get labs today  Repeat bp was 142/86

## 2017-05-05 ENCOUNTER — Encounter: Payer: Self-pay | Admitting: Internal Medicine

## 2017-05-08 NOTE — Telephone Encounter (Signed)
please do nutrition referral cone  System for  Hyperlipidemia Hyperglycemia  And Ht.Marland Kitchen

## 2017-05-09 ENCOUNTER — Other Ambulatory Visit: Payer: Self-pay | Admitting: Emergency Medicine

## 2017-05-09 DIAGNOSIS — E785 Hyperlipidemia, unspecified: Secondary | ICD-10-CM

## 2017-05-09 DIAGNOSIS — R739 Hyperglycemia, unspecified: Secondary | ICD-10-CM

## 2017-05-09 DIAGNOSIS — I10 Essential (primary) hypertension: Secondary | ICD-10-CM

## 2017-05-11 MED FILL — IBUPROFEN 800 MG TABLET: 800 | 7 days supply | Qty: 28 | Fill #0

## 2017-05-11 MED FILL — AMOXICILLIN 500 MG CAPSULE: 500 | 7 days supply | Qty: 25 | Fill #0

## 2017-06-15 ENCOUNTER — Encounter: Payer: 59 | Attending: Internal Medicine | Admitting: Registered"

## 2017-06-15 ENCOUNTER — Encounter: Payer: Self-pay | Admitting: Registered"

## 2017-06-15 DIAGNOSIS — R739 Hyperglycemia, unspecified: Secondary | ICD-10-CM | POA: Diagnosis not present

## 2017-06-15 DIAGNOSIS — E785 Hyperlipidemia, unspecified: Secondary | ICD-10-CM | POA: Diagnosis not present

## 2017-06-15 DIAGNOSIS — I10 Essential (primary) hypertension: Secondary | ICD-10-CM | POA: Diagnosis not present

## 2017-06-15 DIAGNOSIS — Z713 Dietary counseling and surveillance: Secondary | ICD-10-CM | POA: Diagnosis not present

## 2017-06-15 NOTE — Patient Instructions (Addendum)
Continue getting in 3 regular meals and a snack in between. For meals-try to have balanced meals like the plate example.  For snacks-recommend pairing carbohyrdates with some protein (see handout).   Try to practice mindful eating-for meals/snacks try to eat them at a table without electronics present to promote listening to your body's hunger and satiety cues.   Continue getting in regular physical activity. This will help promote overall health and can improve HDL cholesterol and blood sugar management.   Continue trying to include more water and less sweetened beverages.

## 2017-06-15 NOTE — Progress Notes (Signed)
Medical Nutrition Therapy:  Appt start time: 6144 end time:  0855.   Assessment:  Primary concerns today: Pt referred due to hyperglycemia, hyperlipidemia, and hypertension. Pt works as a Designer, jewellery. Pt says she has been paying attention to labels and trying to eat healthier to lose weight. She says she was previously steadily losing some weight, but the weight loss has recently leveled off and she would like to know if there is anything she should change with her habits. Pt says she really likes sweets but tries to have them only once in a while. She says she tries to always have 3 meals per day and a snack in between to help with blood sugar management.  Noted lab values:  (05/05/17)  Hgb A1c: 6.0  LDL Cholesterol: 160 mg/dL HDL Cholesterol: 41.30 mg/dL  Triglycerides: 177 mg/dL   Food Allergies/Intolerances: eggs   Preferred Learning Style:  No preference indicated   Learning Readiness:  Ready  MEDICATIONS: See list.    DIETARY INTAKE:  Usual eating pattern includes 3 meals and 3 snacks per day.   Meals at home are usually eaten in from of the TV.   Everyday foods vary.  Avoided foods include eggs.    Pt says she typically has peppermint tea with 1 tbsp of sugar with her breakfast in the morning. She says she has been trying to cut back on the amount of sweetened beverages she consumes.   24-hr recall:  B ( AM): protein shake with strawberries, banana, almond milk, and protein powder   Snk ( AM): some grapes OR raisins L ( PM): BBQ chicken and salad, sweet tea  Snk ( PM): grapes and crackers D ( PM): sandwich-1 slice whole wheat bread, ham, and soup  Snk ( PM): wheat thins Beverages: sweet tea, water  Usual physical activity: Exercises 3 x per week: stationary bike (15 min), treadmill (30 min), elliptical (10 min), weight lifting, and walking.   Progress Towards Goal(s):  In progress.   Nutritional Diagnosis:  NI-5.11.1 Predicted suboptimal nutrient intake As  related to regular intake of sugar sweetened beverages .  As evidenced by pt's reported diet recall.    Intervention:  Nutrition counseling provided. Dietitian provided education regarding planning balanced meals. Dietitian discussed how different foods affect blood sugar management. Dietitian provided education on heart healthy nutrition. Dietitian provided education regarding carbohydrate counting and how to use a food label to assess carbohydrate, saturated fat, and sodium content of foods. Dietitian encouraged pt to include more water and less sugar sweetened beverages. Pt says she has had a hard time not drinking sweet tea. Recommended pt gradually reduce sugar content of her tea. Dietitian discussed the importance of focusing on healthy eating habits/balanced nutrition, and physical activity to promote overall health rather than just focusing on weight. Dietitian provided education on mindful eating and encouraged pt to eat meals/snacks at a table without electronics present. Dietitian encouraged pt to continue engaging in regular physical activity to promote blood sugar management and help increase HDL levels. Pt appeared agreeable to information/goals discussed.   Goals:   Continue getting in 3 regular meals and a snack in between. For meals-try to have balanced meals like the plate example.  For snacks-recommend pairing carbohyrdates with some protein (see handout).   Try to practice mindful eating-for meals/snacks try to eat them at a table without electronics present to promote listening to your body's hunger and satiety cues.   Continue getting in regular physical activity. This will help  promote overall health and can improve HDL cholesterol and blood sugar management.   Continue trying to include more water and less sweetened beverages.   Teaching Method Utilized:   Visual Auditory  Handouts given during visit include:  Balanced plate with food list  Carb serving list, Low  carbohydrate snack list  Living Well with Diabetes   Planning Healthy Meals   Heart Healthy Nutrition, Triglycerides and Cholesterol, and Low Sodium Nutrition   Barriers to learning/adherence to lifestyle change: None indicated.   Demonstrated degree of understanding via:  Teach Back   Monitoring/Evaluation:  Dietary intake, exercise, and body weight prn.

## 2017-08-04 ENCOUNTER — Encounter: Payer: Self-pay | Admitting: Internal Medicine

## 2017-08-10 ENCOUNTER — Ambulatory Visit (INDEPENDENT_AMBULATORY_CARE_PROVIDER_SITE_OTHER): Payer: 59 | Admitting: Podiatry

## 2017-08-10 ENCOUNTER — Ambulatory Visit: Payer: 59

## 2017-08-10 ENCOUNTER — Encounter: Payer: Self-pay | Admitting: Podiatry

## 2017-08-10 DIAGNOSIS — L84 Corns and callosities: Secondary | ICD-10-CM

## 2017-08-10 DIAGNOSIS — M779 Enthesopathy, unspecified: Secondary | ICD-10-CM

## 2017-08-10 MED ORDER — TRIAMCINOLONE ACETONIDE 10 MG/ML IJ SUSP
10.0000 mg | Freq: Once | INTRAMUSCULAR | Status: AC
  Administered 2017-08-10: 10 mg

## 2017-08-10 NOTE — Progress Notes (Signed)
Subjective:    Patient ID: Sharon Wells, female   DOB: 56 y.o.   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 Patient be seen back to recheck as needed and hopefully we can continue to avoid arthroplasty

## 2017-08-11 ENCOUNTER — Encounter: Payer: 59 | Admitting: Obstetrics & Gynecology

## 2017-08-22 ENCOUNTER — Other Ambulatory Visit: Payer: Self-pay | Admitting: Obstetrics & Gynecology

## 2017-08-22 ENCOUNTER — Encounter: Payer: Self-pay | Admitting: Obstetrics & Gynecology

## 2017-08-22 ENCOUNTER — Ambulatory Visit (INDEPENDENT_AMBULATORY_CARE_PROVIDER_SITE_OTHER): Payer: 59 | Admitting: Obstetrics & Gynecology

## 2017-08-22 VITALS — BP 124/86 | Ht 63.0 in | Wt 153.0 lb

## 2017-08-22 DIAGNOSIS — Z780 Asymptomatic menopausal state: Secondary | ICD-10-CM

## 2017-08-22 DIAGNOSIS — Z01419 Encounter for gynecological examination (general) (routine) without abnormal findings: Secondary | ICD-10-CM

## 2017-08-22 DIAGNOSIS — Z1231 Encounter for screening mammogram for malignant neoplasm of breast: Secondary | ICD-10-CM

## 2017-08-22 NOTE — Patient Instructions (Signed)
1. Well female exam with routine gynecological exam Normal gyn exam.  Normal pap/HPV HR neg 07/2016.  Breasts wnl.  Will schedule screening mammo now.  2. Menopause present No HRT.  No PMB.  Vit D supplements/Ca++ Nutrition/Weight bearing physical activity.  F/U Bone density. - DG Bone Density; Future  Sharon Wells, it was a pleasure to see you today!  I will review your results when available.   Health Maintenance for Postmenopausal Women Menopause is a normal process in which your reproductive ability comes to an end. This process happens gradually over a span of months to years, usually between the ages of 72 and 41. Menopause is complete when you have missed 12 consecutive menstrual periods. It is important to talk with your health care provider about some of the most common conditions that affect postmenopausal women, such as heart disease, cancer, and bone loss (osteoporosis). Adopting a healthy lifestyle and getting preventive care can help to promote your health and wellness. Those actions can also lower your chances of developing some of these common conditions. What should I know about menopause? During menopause, you may experience a number of symptoms, such as:  Moderate-to-severe hot flashes.  Night sweats.  Decrease in sex drive.  Mood swings.  Headaches.  Tiredness.  Irritability.  Memory problems.  Insomnia.  Choosing to treat or not to treat menopausal changes is an individual decision that you make with your health care provider. What should I know about hormone replacement therapy and supplements? Hormone therapy products are effective for treating symptoms that are associated with menopause, such as hot flashes and night sweats. Hormone replacement carries certain risks, especially as you become older. If you are thinking about using estrogen or estrogen with progestin treatments, discuss the benefits and risks with your health care provider. What should I know about  heart disease and stroke? Heart disease, heart attack, and stroke become more likely as you age. This may be due, in part, to the hormonal changes that your body experiences during menopause. These can affect how your body processes dietary fats, triglycerides, and cholesterol. Heart attack and stroke are both medical emergencies. There are many things that you can do to help prevent heart disease and stroke:  Have your blood pressure checked at least every 1-2 years. High blood pressure causes heart disease and increases the risk of stroke.  If you are 50-65 years old, ask your health care provider if you should take aspirin to prevent a heart attack or a stroke.  Do not use any tobacco products, including cigarettes, chewing tobacco, or electronic cigarettes. If you need help quitting, ask your health care provider.  It is important to eat a healthy diet and maintain a healthy weight. ? Be sure to include plenty of vegetables, fruits, low-fat dairy products, and lean protein. ? Avoid eating foods that are high in solid fats, added sugars, or salt (sodium).  Get regular exercise. This is one of the most important things that you can do for your health. ? Try to exercise for at least 150 minutes each week. The type of exercise that you do should increase your heart rate and make you sweat. This is known as moderate-intensity exercise. ? Try to do strengthening exercises at least twice each week. Do these in addition to the moderate-intensity exercise.  Know your numbers.Ask your health care provider to check your cholesterol and your blood glucose. Continue to have your blood tested as directed by your health care provider.  What should  I know about cancer screening? There are several types of cancer. Take the following steps to reduce your risk and to catch any cancer development as early as possible. Breast Cancer  Practice breast self-awareness. ? This means understanding how your  breasts normally appear and feel. ? It also means doing regular breast self-exams. Let your health care provider know about any changes, no matter how small.  If you are 33 or older, have a clinician do a breast exam (clinical breast exam or CBE) every year. Depending on your age, family history, and medical history, it may be recommended that you also have a yearly breast X-ray (mammogram).  If you have a family history of breast cancer, talk with your health care provider about genetic screening.  If you are at high risk for breast cancer, talk with your health care provider about having an MRI and a mammogram every year.  Breast cancer (BRCA) gene test is recommended for women who have family members with BRCA-related cancers. Results of the assessment will determine the need for genetic counseling and BRCA1 and for BRCA2 testing. BRCA-related cancers include these types: ? Breast. This occurs in males or females. ? Ovarian. ? Tubal. This may also be called fallopian tube cancer. ? Cancer of the abdominal or pelvic lining (peritoneal cancer). ? Prostate. ? Pancreatic.  Cervical, Uterine, and Ovarian Cancer Your health care provider may recommend that you be screened regularly for cancer of the pelvic organs. These include your ovaries, uterus, and vagina. This screening involves a pelvic exam, which includes checking for microscopic changes to the surface of your cervix (Pap test).  For women ages 21-65, health care providers may recommend a pelvic exam and a Pap test every three years. For women ages 41-65, they may recommend the Pap test and pelvic exam, combined with testing for human papilloma virus (HPV), every five years. Some types of HPV increase your risk of cervical cancer. Testing for HPV may also be done on women of any age who have unclear Pap test results.  Other health care providers may not recommend any screening for nonpregnant women who are considered low risk for pelvic  cancer and have no symptoms. Ask your health care provider if a screening pelvic exam is right for you.  If you have had past treatment for cervical cancer or a condition that could lead to cancer, you need Pap tests and screening for cancer for at least 20 years after your treatment. If Pap tests have been discontinued for you, your risk factors (such as having a new sexual partner) need to be reassessed to determine if you should start having screenings again. Some women have medical problems that increase the chance of getting cervical cancer. In these cases, your health care provider may recommend that you have screening and Pap tests more often.  If you have a family history of uterine cancer or ovarian cancer, talk with your health care provider about genetic screening.  If you have vaginal bleeding after reaching menopause, tell your health care provider.  There are currently no reliable tests available to screen for ovarian cancer.  Lung Cancer Lung cancer screening is recommended for adults 21-68 years old who are at high risk for lung cancer because of a history of smoking. A yearly low-dose CT scan of the lungs is recommended if you:  Currently smoke.  Have a history of at least 30 pack-years of smoking and you currently smoke or have quit within the past 15 years.  A pack-year is smoking an average of one pack of cigarettes per day for one year.  Yearly screening should:  Continue until it has been 15 years since you quit.  Stop if you develop a health problem that would prevent you from having lung cancer treatment.  Colorectal Cancer  This type of cancer can be detected and can often be prevented.  Routine colorectal cancer screening usually begins at age 17 and continues through age 30.  If you have risk factors for colon cancer, your health care provider may recommend that you be screened at an earlier age.  If you have a family history of colorectal cancer, talk with  your health care provider about genetic screening.  Your health care provider may also recommend using home test kits to check for hidden blood in your stool.  A small camera at the end of a tube can be used to examine your colon directly (sigmoidoscopy or colonoscopy). This is done to check for the earliest forms of colorectal cancer.  Direct examination of the colon should be repeated every 5-10 years until age 42. However, if early forms of precancerous polyps or small growths are found or if you have a family history or genetic risk for colorectal cancer, you may need to be screened more often.  Skin Cancer  Check your skin from head to toe regularly.  Monitor any moles. Be sure to tell your health care provider: ? About any new moles or changes in moles, especially if there is a change in a mole's shape or color. ? If you have a mole that is larger than the size of a pencil eraser.  If any of your family members has a history of skin cancer, especially at a young age, talk with your health care provider about genetic screening.  Always use sunscreen. Apply sunscreen liberally and repeatedly throughout the day.  Whenever you are outside, protect yourself by wearing long sleeves, pants, a wide-brimmed hat, and sunglasses.  What should I know about osteoporosis? Osteoporosis is a condition in which bone destruction happens more quickly than new bone creation. After menopause, you may be at an increased risk for osteoporosis. To help prevent osteoporosis or the bone fractures that can happen because of osteoporosis, the following is recommended:  If you are 80-51 years old, get at least 1,000 mg of calcium and at least 600 mg of vitamin D per day.  If you are older than age 16 but younger than age 22, get at least 1,200 mg of calcium and at least 600 mg of vitamin D per day.  If you are older than age 43, get at least 1,200 mg of calcium and at least 800 mg of vitamin D per  day.  Smoking and excessive alcohol intake increase the risk of osteoporosis. Eat foods that are rich in calcium and vitamin D, and do weight-bearing exercises several times each week as directed by your health care provider. What should I know about how menopause affects my mental health? Depression may occur at any age, but it is more common as you become older. Common symptoms of depression include:  Low or sad mood.  Changes in sleep patterns.  Changes in appetite or eating patterns.  Feeling an overall lack of motivation or enjoyment of activities that you previously enjoyed.  Frequent crying spells.  Talk with your health care provider if you think that you are experiencing depression. What should I know about immunizations? It is important that you  get and maintain your immunizations. These include:  Tetanus, diphtheria, and pertussis (Tdap) booster vaccine.  Influenza every year before the flu season begins.  Pneumonia vaccine.  Shingles vaccine.  Your health care provider may also recommend other immunizations. This information is not intended to replace advice given to you by your health care provider. Make sure you discuss any questions you have with your health care provider. Document Released: 12/23/2005 Document Revised: 05/20/2016 Document Reviewed: 08/04/2015 Elsevier Interactive Patient Education  2018 [employer].

## 2017-08-22 NOTE — Progress Notes (Signed)
Sharon Wells 1960-12-09 [id number]   History:    56 y.o. G3P3L3 Divorced.  RP:  Established patient presenting for annual gyn exam   HPI:  Menopause.  No HRT.  No PMB.  No pelvic pain.  Breasts wnl.  Mictions/BMs wnl.  BMI 27.10.  Seen by Nutritionist for weight loss.  Exercising regularly.  Labs with Fam MD.  Past medical history,surgical history, family history and social history were all reviewed and documented in the EPIC chart.  Gynecologic History No LMP recorded. Patient is not currently having periods (Reason: Perimenopausal). Contraception: post menopausal status, currently abstinent Last Pap: 07/2016. Results were: Neg/HPV HR neg. Last mammogram: 07/2016. Results were: normal Col[place] 2014, will schedule at 5 years Dexa Never  Obstetric History OB History  Gravida Para Term Preterm AB Living  3 3 3     3   SAB TAB Ectopic Multiple Live Births               # Outcome Date GA Lbr Len/2nd Weight Sex Delivery Anes PTL Lv  3 Term           2 Term           1 Term                ROS: A ROS was performed and pertinent positives and negatives are included in the history.  GENERAL: No fevers or chills. HEENT: No change in vision, no earache, sore throat or sinus congestion. NECK: No pain or stiffness. CARDIOVASCULAR: No chest pain or pressure. No palpitations. PULMONARY: No shortness of breath, cough or wheeze. GASTROINTESTINAL: No abdominal pain, nausea, vomiting or diarrhea, melena or bright red blood per rectum. GENITOURINARY: No urinary frequency, urgency, hesitancy or dysuria. MUSCULOSKELETAL: No joint or muscle pain, no back pain, no recent trauma. DERMATOLOGIC: No rash, no itching, no lesions. ENDOCRINE: No polyuria, polydipsia, no heat or cold intolerance. No recent change in weight. HEMATOLOGICAL: No anemia or easy bruising or bleeding. NEUROLOGIC: No headache, seizures, numbness, tingling or weakness. PSYCHIATRIC: No depression, no loss of interest in normal activity  or change in sleep pattern.     Exam:   BP 124/86   Ht 5\' 3"  (1.6 m)   Wt 153 lb (69.4 kg)   BMI 27.10 kg/m   Body mass index is 27.1 kg/m.  General appearance : Well developed well nourished female. No acute distress HEENT: Eyes: no retinal hemorrhage or exudates,  Neck supple, trachea midline, no carotid bruits, no thyroidmegaly Lungs: Clear to auscultation, no rhonchi or wheezes, or rib retractions  Heart: Regular rate and rhythm, no murmurs or gallops Breast:Examined in sitting and supine position were symmetrical in appearance, no palpable masses or tenderness,  no skin retraction, no nipple inversion, no nipple discharge, no skin discoloration, no axillary or supraclavicular lymphadenopathy Abdomen: no palpable masses or tenderness, no rebound or guarding Extremities: no edema or skin discoloration or tenderness  Pelvic: Vulva normal  Bartholin, Urethra, Skene Glands: Within normal limits             Vagina: No gross lesions or discharge  Cervix: No gross lesions or discharge  Uterus  AV, normal size, shape and consistency, non-tender and mobile  Adnexa  Without masses or tenderness  Anus and perineum  normal     Assessment/Plan:  56 y.o. female for annual exam    1. Well female exam with routine gynecological exam Normal gyn exam.  Normal pap/HPV HR neg 07/2016.  Breasts wnl.  Will schedule screening mammo now.  2. Menopause present No HRT.  No PMB.  Vit D supplements/Ca++ Nutrition/Weight bearing physical activity.  F/U Bone density. - DG Bone Density; Future  Princess Bruins MD, 9:23 AM 08/22/2017

## 2017-09-11 ENCOUNTER — Ambulatory Visit
Admission: RE | Admit: 2017-09-11 | Discharge: 2017-09-11 | Disposition: A | Discharge status: Home / Self Care | Payer: 59 | Source: Ambulatory Visit | Attending: Obstetrics & Gynecology | Admitting: Obstetrics & Gynecology

## 2017-09-11 DIAGNOSIS — Z1231 Encounter for screening mammogram for malignant neoplasm of breast: Secondary | ICD-10-CM | POA: Diagnosis not present

## 2017-09-13 ENCOUNTER — Other Ambulatory Visit: Payer: Self-pay | Admitting: Gynecology

## 2017-09-13 ENCOUNTER — Ambulatory Visit (INDEPENDENT_AMBULATORY_CARE_PROVIDER_SITE_OTHER): Payer: 59 | Admitting: Podiatry

## 2017-09-13 ENCOUNTER — Encounter: Payer: Self-pay | Admitting: Podiatry

## 2017-09-13 DIAGNOSIS — L84 Corns and callosities: Secondary | ICD-10-CM | POA: Diagnosis not present

## 2017-09-13 DIAGNOSIS — Z13820 Encounter for screening for osteoporosis: Secondary | ICD-10-CM

## 2017-09-13 DIAGNOSIS — M779 Enthesopathy, unspecified: Secondary | ICD-10-CM

## 2017-09-13 MED ORDER — TRIAMCINOLONE ACETONIDE 10 MG/ML IJ SUSP
10.0000 mg | Freq: Once | INTRAMUSCULAR | Status: AC
  Administered 2017-09-13: 10 mg

## 2017-09-13 NOTE — Progress Notes (Signed)
Subjective:    Patient ID: Sharon Wells, female   DOB: 56 y.o.   MRN: [id number]   HPI patient presents with a a lot of pain on the lesion fifth digit left with fluid buildup and states the right once doing great    ROS      Objective:  Physical Exam neurovascular status intact with patient's left fifth digit showing interphalangeal joint fluid buildup with keratotic lesion that's painful when pressed     Assessment:   Interphalangeal joint capsulitis fifth digit left with keratotic lesion formation      Plan:   I went ahead today I anesthetized the fifth digit 60 Milligan times like Marcaine mixture and injected the interphalangeal joint to milligrams Dexon some Kenalog 2 mill grams Xylocaine and then debrided the lesion fully. Understands ultimately may require surgery

## 2017-09-22 ENCOUNTER — Encounter: Payer: Self-pay | Admitting: Podiatry

## 2017-10-20 ENCOUNTER — Ambulatory Visit: Payer: Self-pay | Admitting: Internal Medicine

## 2017-11-16 ENCOUNTER — Ambulatory Visit: Payer: 59 | Admitting: Internal Medicine

## 2017-12-18 ENCOUNTER — Ambulatory Visit (INDEPENDENT_AMBULATORY_CARE_PROVIDER_SITE_OTHER): Payer: 59

## 2017-12-18 ENCOUNTER — Encounter: Payer: Self-pay | Admitting: Gynecology

## 2017-12-18 DIAGNOSIS — Z13820 Encounter for screening for osteoporosis: Secondary | ICD-10-CM | POA: Diagnosis not present

## 2018-01-10 NOTE — Progress Notes (Signed)
Chief Complaint  Patient presents with  . Follow-up    no new concerns,  . Annual Exam    HPI: Patient  Sharon Wells  57 y.o. comes in today for [place] visit  And condition fu  bp has been good  Lost weight exercising and attending to diet  bp  Gets gyne check  . wil need colon  Cancer screening  Health Maintenance  Topic Date Due  . Hepatitis C Screening  1961-03-30  . HIV Screening  10/19/1974  . INFLUENZA VACCINE  09/01/2049 (Originally 06/14/2017)  . MAMMOGRAM  09/12/2019  . PAP SMEAR  02/25/2020  . COLONOSCOPY  11/14/2022  . TETANUS/TDAP  05/08/2023   Health Maintenance Review LIFESTYLE:  Exercise:   Yes  Regular at this time.  Tobacco/ETS: no Alcohol:  no Sugar beverages: Sleep:5-6  Drug use: no HH of  4 1 dog  Work: hired 40 hours and   Ave  2 hours at home  And  50+  Works ui pain management  Dr phillips   ROS:  GEN/ HEENT: No fever, significant weight changes sweats headaches vision problems hearing changes, CV/ PULM; No chest pain shortness of breath cough, syncope,edema  change in exercise tolerance. GI /GU: No adominal pain, vomiting, change in bowel habits. No blood in the stool. No significant GU symptoms. SKIN/HEME: ,no acute skin rashes suspicious lesions or bleeding. No lymphadenopathy, nodules, masses.  NEURO/ PSYCH:  No neurologic signs such as weakness numbness. No depression anxiety. IMM/ Allergy: No unusual infections.  Allergy .   REST of 12 system review negative except as per HPI   Past Medical History:  Diagnosis Date  . Chicken pox   . Fibroids    With history of anemia  . Hyperlipidemia     History reviewed. No pertinent surgical history.  Family History  Problem Relation Age of Onset  . Sarcoidosis Mother        Deceased in her 48s  . Hypertension Sister   . Diabetes Sister   . Sarcoidosis Sister   . COPD Sister        Emphysema  . Pulmonary embolism Sister        Apparently am provoked  . Heart  disease Maternal Grandmother     Social History   Socioeconomic History  . Marital status: Legally Separated    Spouse name: None  . Number of children: None  . Years of education: None  . Highest education level: None  Social Needs  . Financial resource strain: None  . Food insecurity - worry: None  . Food insecurity - inability: None  . Transportation needs - medical: None  . Transportation needs - non-medical: None  Occupational History  . None  Tobacco Use  . Smoking status: Never Smoker  . Smokeless tobacco: Never Used  Substance and Sexual Activity  . Alcohol use: No  . Drug use: No  . Sexual activity: Not Currently    Comment: 1ST intercourse- 15, partners- 2,   Other Topics Concern  . 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  Outpatient Medications Prior to Visit  Medication Sig Dispense Refill  . cyanocobalamin 2000 MCG tablet Take 2,000 mcg by mouth daily.    . NON FORMULARY [employer]: Multivitamin Complex with TRE-EN-EN Grain Concentrates    . Omega-3 Fatty Acids (FISH OIL) 1000 MG CAPS Take by mouth.     No facility-administered medications prior to visit.      EXAM:  BP 138/82 (BP Location: Right Arm, Patient Position: Sitting, Cuff Size: Normal)   Pulse 78   Temp 98.1 F (36.7 C) (Oral)   Ht _0  (1.6 m)   Wt 148 lb 3.2 oz (67.2 kg)   LMP 12/15/2013   BMI 26.25 kg/m   Body mass index is 26.25 kg/m. Wt Readings from Last 3 Encounters:  01/11/18 148 lb 3.2 oz (67.2 kg)  08/22/17 153 lb (69.4 kg)  05/02/17 149 lb 12.8 oz (67.9 kg)    Physical Exam: Vital signs reviewed JSH:FWYO is a well-developed well-nourished alert cooperative    who  appearsr stated age in no acute distress.  HEENT: normocephalic atraumatic , Eyes: PERRL EOM's full, conjunctiva clear, Nares: paten,t no deformity discharge or tenderness., Ears: no deformity EAC's clear TMs with normal landmarks. Mouth: clear OP, no lesions, edema.  Moist mucous membranes. Dentition in adequate repair. NECK: supple without masses, thyromegaly or bruits. CHEST/PULM:  Clear to auscultation and percussion breath sounds equal no wheeze , rales or rhonchi. No chest wall deformities or tenderness. Breast: normal by inspection . No dimpling, discharge, masses, tenderness or discharge . CV: PMI is nondisplaced, S1 S2 no gallops, murmurs, rubs. Peripheral pulses are full without delay.No JVD .  ABDOMEN: Bowel sounds normal nontender  No guard or rebound, no hepato splenomegal no CVA tenderness.  No hernia. Extremtities:  No clubbing cyanosis or edema, no acute joint swelling or redness no focal atrophy NEURO:  Oriented x3, cranial nerves 3-12 appear to be intact, no obvious focal weakness,gait within normal limits no abnormal reflexes or asymmetrical SKIN: No acute rashes normal turgor, color, no bruising or petechiae. PSYCH: Oriented, good eye contact, no obvious depression anxiety, cognition and judgment appear normal. LN: no cervical axillary inguinal adenopathy    BP Readings from Last 3 Encounters:  01/11/18 138/82  08/22/17 124/86  05/02/17 (!) 142/86    Lab results reviewed with patient   ASSESSMENT AND PLAN:  Discussed the following assessment and plan:  Visit for preventive health examination - Plan: Basic metabolic panel, CBC with Differential/Platelet, Hemoglobin A1c, Hepatic function panel, Lipid panel, TSH  Essential hypertension - Plan: Basic metabolic panel, CBC with Differential/Platelet, Hemoglobin A1c, Hepatic function panel, Lipid panel, TSH  Hyperglycemia - Plan: Basic metabolic panel, CBC with Differential/Platelet, Hemoglobin A1c, Hepatic function panel,  Lipid panel, TSH  Hyperlipidemia, unspecified hyperlipidemia type - Plan: Basic metabolic panel, CBC with Differential/Platelet, Hemoglobin A1c, Hepatic function panel, Lipid panel, TSH Lab monitoring   Get fasting lab at [facility] when possible  If bp controlled and all ok then yearly check  CPX. Contact us with preference about colonoscopy referral. Patient Care Team: Addis Bennie, Standley Brooking, MD as PCP - General (Internal Medicine) Princess Bruins, MD as Consulting Physician (Obstetrics and Gynecology) Patient Instructions  Glad you are doing well.   BP goal 120/80  Fasting labs at [facility] office    If all ok then yearly CPX and lab   Health Maintenance, Female Adopting a healthy lifestyle and getting preventive care can go a long way to promote health and wellness. Talk with your health care  provider about what schedule of regular examinations is right for you. This is a good chance for you to check in with your provider about disease prevention and staying healthy. In between checkups, there are plenty of things you can do on your own. Experts have done a lot of research about which lifestyle changes and preventive measures are most likely to keep you healthy. Ask your health care provider for more information. Weight and diet Eat a healthy diet  Be sure to include plenty of vegetables, fruits, low-fat dairy products, and lean protein.  Do not eat a lot of foods high in solid fats, added sugars, or salt.  Get regular exercise. This is one of the most important things you can do for your health. ? Most adults should exercise for at least 150 minutes each week. The exercise should increase your heart rate and make you sweat (moderate-intensity exercise). ? Most adults should also do strengthening exercises at least twice a week. This is in addition to the moderate-intensity exercise.  Maintain a healthy weight  Body mass index (BMI) is a measurement that can be used to identify possible weight  problems. It estimates body fat based on height and weight. Your health care provider can help determine your BMI and help you achieve or maintain a healthy weight.  For females 64 years of age and older: ? A BMI below 18.5 is considered underweight. ? A BMI of 18.5 to 24.9 is normal. ? A BMI of 25 to 29.9 is considered overweight. ? A BMI of 30 and above is considered obese.  Watch levels of cholesterol and blood lipids  You should start having your blood tested for lipids and cholesterol at 59 years of age, then have this test every 5 years.  You may need to have your cholesterol levels checked more often if: ? Your lipid or cholesterol levels are high. ? You are older than 59 years of age. ? You are at high risk for heart disease.  Cancer screening Lung Cancer  Lung cancer screening is recommended for adults 38-64 years old who are at high risk for lung cancer because of a history of smoking.  A yearly low-dose CT scan of the lungs is recommended for people who: ? Currently smoke. ? Have quit within the past 15 years. ? Have at least a 30-pack-year history of smoking. A pack year is smoking an average of one pack of cigarettes a day for 1 year.  Yearly screening should continue until it has been 15 years since you quit.  Yearly screening should stop if you develop a health problem that would prevent you from having lung cancer treatment.  Breast Cancer  Practice breast self-awareness. This means understanding how your breasts normally appear and feel.  It also means doing regular breast self-exams. Let your health care provider know about any changes, no matter how small.  If you are in your 20s or 30s, you should have a clinical breast exam (CBE) by a health care provider every 1-3 years as part of a regular health exam.  If you are 82 or older, have a CBE every year. Also consider having a breast X-ray (mammogram) every year.  If you have a family history of breast  cancer, talk to your health care provider about genetic screening.  If you are at high risk for breast cancer, talk to your health care provider about having an MRI and a mammogram every year.  Breast cancer gene (BRCA) assessment is  recommended for women who have family members with BRCA-related cancers. BRCA-related cancers include: ? Breast. ? Ovarian. ? Tubal. ? Peritoneal cancers.  Results of the assessment will determine the need for genetic counseling and BRCA1 and BRCA2 testing.  Cervical Cancer Your health care provider may recommend that you be screened regularly for cancer of the pelvic organs (ovaries, uterus, and vagina). This screening involves a pelvic examination, including checking for microscopic changes to the surface of your cervix (Pap test). You may be encouraged to have this screening done every 3 years, beginning at age 77.  For women ages 30-65, health care providers may recommend pelvic exams and Pap testing every 3 years, or they may recommend the Pap and pelvic exam, combined with testing for human papilloma virus (HPV), every 5 years. Some types of HPV increase your risk of cervical cancer. Testing for HPV may also be done on women of any age with unclear Pap test results.  Other health care providers may not recommend any screening for nonpregnant women who are considered low risk for pelvic cancer and who do not have symptoms. Ask your health care provider if a screening pelvic exam is right for you.  If you have had past treatment for cervical cancer or a condition that could lead to cancer, you need Pap tests and screening for cancer for at least 20 years after your treatment. If Pap tests have been discontinued, your risk factors (such as having a new sexual partner) need to be reassessed to determine if screening should resume. Some women have medical problems that increase the chance of getting cervical cancer. In these cases, your health care provider may  recommend more frequent screening and Pap tests.  Colorectal Cancer  This type of cancer can be detected and often prevented.  Routine colorectal cancer screening usually begins at 60 years of age and continues through 59 years of age.  Your health care provider may recommend screening at an earlier age if you have risk factors for colon cancer.  Your health care provider may also recommend using home test kits to check for hidden blood in the stool.  A small camera at the end of a tube can be used to examine your colon directly (sigmoidoscopy or colonoscopy). This is done to check for the earliest forms of colorectal cancer.  Routine screening usually begins at age 28.  Direct examination of the colon should be repeated every 5-10 years through 59 years of age. However, you may need to be screened more often if early forms of precancerous polyps or small growths are found.  Skin Cancer  Check your skin from head to toe regularly.  Tell your health care provider about any new moles or changes in moles, especially if there is a change in a mole's shape or color.  Also tell your health care provider if you have a mole that is larger than the size of a pencil eraser.  Always use sunscreen. Apply sunscreen liberally and repeatedly throughout the day.  Protect yourself by wearing long sleeves, pants, a wide-brimmed hat, and sunglasses whenever you are outside.  Heart disease, diabetes, and high blood pressure  High blood pressure causes heart disease and increases the risk of stroke. High blood pressure is more likely to develop in: ? People who have blood pressure in the high end of the normal range (130-139/85-89 mm Hg). ? People who are overweight or obese. ? People who are African American.  If you are 18-39 years of  age, have your blood pressure checked every 3-5 years. If you are 47 years of age or older, have your blood pressure checked every year. You should have your blood  pressure measured twice-once when you are at a hospital or clinic, and once when you are not at a hospital or clinic. Record the average of the two measurements. To check your blood pressure when you are not at a hospital or clinic, you can use: ? An automated blood pressure machine at a pharmacy. ? A home blood pressure monitor.  If you are between 30 years and 37 years old, ask your health care provider if you should take aspirin to prevent strokes.  Have regular diabetes screenings. This involves taking a blood sample to check your fasting blood sugar level. ? If you are at a normal weight and have a low risk for diabetes, have this test once every three years after 59 years of age. ? If you are overweight and have a high risk for diabetes, consider being tested at a younger age or more often. Preventing infection Hepatitis B  If you have a higher risk for hepatitis B, you should be screened for this virus. You are considered at high risk for hepatitis B if: ? You were born in a country where hepatitis B is common. Ask your health care provider which countries are considered high risk. ? Your parents were born in a high-risk country, and you have not been immunized against hepatitis B (hepatitis B vaccine). ? You have HIV or AIDS. ? You use needles to inject street drugs. ? You live with someone who has hepatitis B. ? You have had sex with someone who has hepatitis B. ? You get hemodialysis treatment. ? You take certain medicines for conditions, including cancer, organ transplantation, and autoimmune conditions.  Hepatitis C  Blood testing is recommended for: ? Everyone born from [birth year] through 1967. ? Anyone with known risk factors for hepatitis C.  Sexually transmitted infections (STIs)  You should be screened for sexually transmitted infections (STIs) including gonorrhea and chlamydia if: ? You are sexually active and are younger than 59 years of age. ? You are older than 59 years  of age and your health care provider tells you that you are at risk for this type of infection. ? Your sexual activity has changed since you were last screened and you are at an increased risk for chlamydia or gonorrhea. Ask your health care provider if you are at risk.  If you do not have HIV, but are at risk, it may be recommended that you take a prescription medicine daily to prevent HIV infection. This is called pre-exposure prophylaxis (PrEP). You are considered at risk if: ? You are sexually active and do not regularly use condoms or know the HIV status of your partner(s). ? You take drugs by injection. ? You are sexually active with a partner who has HIV.  Talk with your health care provider about whether you are at high risk of being infected with HIV. If you choose to begin PrEP, you should first be tested for HIV. You should then be tested every 3 months for as long as you are taking PrEP. Pregnancy  If you are premenopausal and you may become pregnant, ask your health care provider about preconception counseling.  If you may become pregnant, take 400 to 800 micrograms (mcg) of folic acid every day.  If you want to prevent pregnancy, talk to your health care provider about  birth control (contraception). Osteoporosis and menopause  Osteoporosis is a disease in which the bones lose minerals and strength with aging. This can result in serious bone fractures. Your risk for osteoporosis can be identified using a bone density scan.  If you are 19 years of age or older, or if you are at risk for osteoporosis and fractures, ask your health care provider if you should be screened.  Ask your health care provider whether you should take a calcium or vitamin D supplement to lower your risk for osteoporosis.  Menopause may have certain physical symptoms and risks.  Hormone replacement therapy may reduce some of these symptoms and risks. Talk to your health care provider about whether hormone  replacement therapy is right for you. Follow these instructions at home:  Schedule regular health, dental, and eye exams.  Stay current with your immunizations.  Do not use any tobacco products including cigarettes, chewing tobacco, or electronic cigarettes.  If you are pregnant, do not drink alcohol.  If you are breastfeeding, limit how much and how often you drink alcohol.  Limit alcohol intake to no more than 1 drink per day for nonpregnant women. One drink equals 12 ounces of beer, 5 ounces of wine, or 1 ounces of hard liquor.  Do not use street drugs.  Do not share needles.  Ask your health care provider for help if you need support or information about quitting drugs.  Tell your health care provider if you often feel depressed.  Tell your health care provider if you have ever been abused or do not feel safe at home. This information is not intended to replace advice given to you by your health care provider. Make sure you discuss any questions you have with your health care provider. Document Released: 05/16/2011 Document Revised: 04/07/2016 Document Reviewed: 08/04/2015 Elsevier Interactive Patient Education  2018 [employer]. Arron Tetrault M.D.

## 2018-01-11 ENCOUNTER — Encounter: Payer: Self-pay | Admitting: Obstetrics & Gynecology

## 2018-01-11 ENCOUNTER — Ambulatory Visit (INDEPENDENT_AMBULATORY_CARE_PROVIDER_SITE_OTHER): Payer: 59 | Admitting: Internal Medicine

## 2018-01-11 ENCOUNTER — Encounter: Payer: Self-pay | Admitting: Internal Medicine

## 2018-01-11 VITALS — BP 138/82 | HR 78 | Temp 98.1°F | Ht 63.0 in | Wt 148.2 lb

## 2018-01-11 DIAGNOSIS — E785 Hyperlipidemia, unspecified: Secondary | ICD-10-CM

## 2018-01-11 DIAGNOSIS — Z0000 Encounter for general adult medical examination without abnormal findings: Secondary | ICD-10-CM | POA: Diagnosis not present

## 2018-01-11 DIAGNOSIS — R739 Hyperglycemia, unspecified: Secondary | ICD-10-CM

## 2018-01-11 DIAGNOSIS — I10 Essential (primary) hypertension: Secondary | ICD-10-CM

## 2018-01-11 NOTE — Patient Instructions (Signed)
Glad you are doing well.   BP goal 120/80  Fasting labs at [facility] office    If all ok then yearly CPX and lab   Health Maintenance, Female Adopting a healthy lifestyle and getting preventive care can go a long way to promote health and wellness. Talk with your health care provider about what schedule of regular examinations is right for you. This is a good chance for you to check in with your provider about disease prevention and staying healthy. In between checkups, there are plenty of things you can do on your own. Experts have done a lot of research about which lifestyle changes and preventive measures are most likely to keep you healthy. Ask your health care provider for more information. Weight and diet Eat a healthy diet  Be sure to include plenty of vegetables, fruits, low-fat dairy products, and lean protein.  Do not eat a lot of foods high in solid fats, added sugars, or salt.  Get regular exercise. This is one of the most important things you can do for your health. ? Most adults should exercise for at least 150 minutes each week. The exercise should increase your heart rate and make you sweat (moderate-intensity exercise). ? Most adults should also do strengthening exercises at least twice a week. This is in addition to the moderate-intensity exercise.  Maintain a healthy weight  Body mass index (BMI) is a measurement that can be used to identify possible weight problems. It estimates body fat based on height and weight. Your health care provider can help determine your BMI and help you achieve or maintain a healthy weight.  For females 2 years of age and older: ? A BMI below 18.5 is considered underweight. ? A BMI of 18.5 to 24.9 is normal. ? A BMI of 25 to 29.9 is considered overweight. ? A BMI of 30 and above is considered obese.  Watch levels of cholesterol and blood lipids  You should start having your blood tested for lipids and cholesterol at 59 years of age, then  have this test every 5 years.  You may need to have your cholesterol levels checked more often if: ? Your lipid or cholesterol levels are high. ? You are older than 59 years of age. ? You are at high risk for heart disease.  Cancer screening Lung Cancer  Lung cancer screening is recommended for adults 2-68 years old who are at high risk for lung cancer because of a history of smoking.  A yearly low-dose CT scan of the lungs is recommended for people who: ? Currently smoke. ? Have quit within the past 15 years. ? Have at least a 30-pack-year history of smoking. A pack year is smoking an average of one pack of cigarettes a day for 1 year.  Yearly screening should continue until it has been 15 years since you quit.  Yearly screening should stop if you develop a health problem that would prevent you from having lung cancer treatment.  Breast Cancer  Practice breast self-awareness. This means understanding how your breasts normally appear and feel.  It also means doing regular breast self-exams. Let your health care provider know about any changes, no matter how small.  If you are in your 20s or 30s, you should have a clinical breast exam (CBE) by a health care provider every 1-3 years as part of a regular health exam.  If you are 17 or older, have a CBE every year. Also consider having a breast X-ray (  mammogram) every year.  If you have a family history of breast cancer, talk to your health care provider about genetic screening.  If you are at high risk for breast cancer, talk to your health care provider about having an MRI and a mammogram every year.  Breast cancer gene (BRCA) assessment is recommended for women who have family members with BRCA-related cancers. BRCA-related cancers include: ? Breast. ? Ovarian. ? Tubal. ? Peritoneal cancers.  Results of the assessment will determine the need for genetic counseling and BRCA1 and BRCA2 testing.  Cervical Cancer Your health  care provider may recommend that you be screened regularly for cancer of the pelvic organs (ovaries, uterus, and vagina). This screening involves a pelvic examination, including checking for microscopic changes to the surface of your cervix (Pap test). You may be encouraged to have this screening done every 3 years, beginning at age 59.  For women ages 35-65, health care providers may recommend pelvic exams and Pap testing every 3 years, or they may recommend the Pap and pelvic exam, combined with testing for human papilloma virus (HPV), every 5 years. Some types of HPV increase your risk of cervical cancer. Testing for HPV may also be done on women of any age with unclear Pap test results.  Other health care providers may not recommend any screening for nonpregnant women who are considered low risk for pelvic cancer and who do not have symptoms. Ask your health care provider if a screening pelvic exam is right for you.  If you have had past treatment for cervical cancer or a condition that could lead to cancer, you need Pap tests and screening for cancer for at least 20 years after your treatment. If Pap tests have been discontinued, your risk factors (such as having a new sexual partner) need to be reassessed to determine if screening should resume. Some women have medical problems that increase the chance of getting cervical cancer. In these cases, your health care provider may recommend more frequent screening and Pap tests.  Colorectal Cancer  This type of cancer can be detected and often prevented.  Routine colorectal cancer screening usually begins at 59 years of age and continues through 59 years of age.  Your health care provider may recommend screening at an earlier age if you have risk factors for colon cancer.  Your health care provider may also recommend using home test kits to check for hidden blood in the stool.  A small camera at the end of a tube can be used to examine your colon  directly (sigmoidoscopy or colonoscopy). This is done to check for the earliest forms of colorectal cancer.  Routine screening usually begins at age 14.  Direct examination of the colon should be repeated every 5-10 years through 59 years of age. However, you may need to be screened more often if early forms of precancerous polyps or small growths are found.  Skin Cancer  Check your skin from head to toe regularly.  Tell your health care provider about any new moles or changes in moles, especially if there is a change in a mole's shape or color.  Also tell your health care provider if you have a mole that is larger than the size of a pencil eraser.  Always use sunscreen. Apply sunscreen liberally and repeatedly throughout the day.  Protect yourself by wearing long sleeves, pants, a wide-brimmed hat, and sunglasses whenever you are outside.  Heart disease, diabetes, and high blood pressure  High blood pressure  causes heart disease and increases the risk of stroke. High blood pressure is more likely to develop in: ? People who have blood pressure in the high end of the normal range (130-139/85-89 mm Hg). ? People who are overweight or obese. ? People who are African American.  If you are 28-69 years of age, have your blood pressure checked every 3-5 years. If you are 65 years of age or older, have your blood pressure checked every year. You should have your blood pressure measured twice-once when you are at a hospital or clinic, and once when you are not at a hospital or clinic. Record the average of the two measurements. To check your blood pressure when you are not at a hospital or clinic, you can use: ? An automated blood pressure machine at a pharmacy. ? A home blood pressure monitor.  If you are between 82 years and 34 years old, ask your health care provider if you should take aspirin to prevent strokes.  Have regular diabetes screenings. This involves taking a blood sample to  check your fasting blood sugar level. ? If you are at a normal weight and have a low risk for diabetes, have this test once every three years after 59 years of age. ? If you are overweight and have a high risk for diabetes, consider being tested at a younger age or more often. Preventing infection Hepatitis B  If you have a higher risk for hepatitis B, you should be screened for this virus. You are considered at high risk for hepatitis B if: ? You were born in a country where hepatitis B is common. Ask your health care provider which countries are considered high risk. ? Your parents were born in a high-risk country, and you have not been immunized against hepatitis B (hepatitis B vaccine). ? You have HIV or AIDS. ? You use needles to inject street drugs. ? You live with someone who has hepatitis B. ? You have had sex with someone who has hepatitis B. ? You get hemodialysis treatment. ? You take certain medicines for conditions, including cancer, organ transplantation, and autoimmune conditions.  Hepatitis C  Blood testing is recommended for: ? Everyone born from [birth year] through 1967. ? Anyone with known risk factors for hepatitis C.  Sexually transmitted infections (STIs)  You should be screened for sexually transmitted infections (STIs) including gonorrhea and chlamydia if: ? You are sexually active and are younger than 59 years of age. ? You are older than 60 years of age and your health care provider tells you that you are at risk for this type of infection. ? Your sexual activity has changed since you were last screened and you are at an increased risk for chlamydia or gonorrhea. Ask your health care provider if you are at risk.  If you do not have HIV, but are at risk, it may be recommended that you take a prescription medicine daily to prevent HIV infection. This is called pre-exposure prophylaxis (PrEP). You are considered at risk if: ? You are sexually active and do not regularly  use condoms or know the HIV status of your partner(s). ? You take drugs by injection. ? You are sexually active with a partner who has HIV.  Talk with your health care provider about whether you are at high risk of being infected with HIV. If you choose to begin PrEP, you should first be tested for HIV. You should then be tested every 3 months for as long  as you are taking PrEP. Pregnancy  If you are premenopausal and you may become pregnant, ask your health care provider about preconception counseling.  If you may become pregnant, take 400 to 800 micrograms (mcg) of folic acid every day.  If you want to prevent pregnancy, talk to your health care provider about birth control (contraception). Osteoporosis and menopause  Osteoporosis is a disease in which the bones lose minerals and strength with aging. This can result in serious bone fractures. Your risk for osteoporosis can be identified using a bone density scan.  If you are 34 years of age or older, or if you are at risk for osteoporosis and fractures, ask your health care provider if you should be screened.  Ask your health care provider whether you should take a calcium or vitamin D supplement to lower your risk for osteoporosis.  Menopause may have certain physical symptoms and risks.  Hormone replacement therapy may reduce some of these symptoms and risks. Talk to your health care provider about whether hormone replacement therapy is right for you. Follow these instructions at home:  Schedule regular health, dental, and eye exams.  Stay current with your immunizations.  Do not use any tobacco products including cigarettes, chewing tobacco, or electronic cigarettes.  If you are pregnant, do not drink alcohol.  If you are breastfeeding, limit how much and how often you drink alcohol.  Limit alcohol intake to no more than 1 drink per day for nonpregnant women. One drink equals 12 ounces of beer, 5 ounces of wine, or 1 ounces  of hard liquor.  Do not use street drugs.  Do not share needles.  Ask your health care provider for help if you need support or information about quitting drugs.  Tell your health care provider if you often feel depressed.  Tell your health care provider if you have ever been abused or do not feel safe at home. This information is not intended to replace advice given to you by your health care provider. Make sure you discuss any questions you have with your health care provider. Document Released: 05/16/2011 Document Revised: 04/07/2016 Document Reviewed: 08/04/2015 Elsevier Interactive Patient Education  [employer].

## 2018-02-19 ENCOUNTER — Encounter: Payer: Self-pay | Admitting: Internal Medicine

## 2018-02-20 NOTE — Telephone Encounter (Signed)
See updated message 

## 2018-02-20 NOTE — Telephone Encounter (Signed)
Dr. Regis Bill please advise.  Reply to Ashtyn.

## 2018-02-20 NOTE — Telephone Encounter (Signed)
Observe  For progression of any sx  Sever headaches   Weakness other vision changes   .  We can see you if  Getting worse of progrssion.

## 2018-02-26 ENCOUNTER — Ambulatory Visit: Payer: Self-pay

## 2018-02-26 ENCOUNTER — Ambulatory Visit (INDEPENDENT_AMBULATORY_CARE_PROVIDER_SITE_OTHER): Payer: 59 | Admitting: Podiatry

## 2018-02-26 DIAGNOSIS — M2041 Other hammer toe(s) (acquired), right foot: Secondary | ICD-10-CM | POA: Diagnosis not present

## 2018-02-26 DIAGNOSIS — M2042 Other hammer toe(s) (acquired), left foot: Secondary | ICD-10-CM

## 2018-02-26 DIAGNOSIS — M779 Enthesopathy, unspecified: Secondary | ICD-10-CM

## 2018-02-26 NOTE — Patient Instructions (Signed)
Hammer Toe Hammer toe is a change in the shape (a deformity) of your second, third, or fourth toe. The deformity causes the middle joint of your toe to stay bent. This causes pain, especially when you are wearing shoes. Hammer toe starts gradually. At first, the toe can be straightened. Gradually over time, the deformity becomes stiff and permanent. Early treatments to keep the toe straight may relieve pain. As the deformity becomes stiff and permanent, surgery may be needed to straighten the toe. What are the causes? Hammer toe is caused by abnormal bending of the toe joint that is closest to your foot. It happens gradually over time. This pulls on the muscles and connections (tendons) of the toe joint, making them weak and stiff. It is often related to wearing shoes that are too short or narrow and do not let your toes straighten. What increases the risk? You may be at greater risk for hammer toe if you:  Are female.  Are older.  Wear shoes that are too small.  Wear high-heeled shoes that pinch your toes.  Are a ballet dancer.  Have a second toe that is longer than your big toe (first toe).  Injure your foot or toe.  Have arthritis.  Have a family history of hammer toe.  Have a nerve or muscle disorder.  What are the signs or symptoms? The main symptoms of this condition are pain and deformity of the toe. The pain is worse when wearing shoes, walking, or running. Other symptoms may include:  Corns or calluses over the bent part of the toe or between the toes.  Redness and a burning feeling on the toe.  An open sore that forms on the top of the toe.  Not being able to straighten the toe.  How is this diagnosed? This condition is diagnosed based on your symptoms and a physical exam. During the exam, your health care provider will try to straighten your toe to see how stiff the deformity is. You may also have tests, such as:  A blood test to check for rheumatoid  arthritis.  An X-ray to show how severe the deformity is.  How is this treated? Treatment for this condition will depend on how stiff the deformity is. Surgery is often needed. However, sometimes a hammer toe can be straightened without surgery. Treatments that do not involve surgery include:  Taping the toe into a straightened position.  Using pads and cushions to protect the toe (orthotics).  Wearing shoes that provide enough room for the toes.  Doing toe-stretching exercises at home.  Taking an NSAID to reduce pain and swelling.  If these treatments do not help or the toe cannot be straightened, surgery is the next option. The most common surgeries used to straighten a hammer toe include:  Arthroplasty. In this procedure, part of the joint is removed, and that allows the toe to straighten.  Fusion. In this procedure, cartilage between the two bones of the joint is taken out and the bones are fused together into one longer bone.  Implantation. In this procedure, part of the bone is removed and replaced with an implant to let the toe move again.  Flexor tendon transfer. In this procedure, the tendons that curl the toes down (flexor tendons) are repositioned.  Follow these instructions at home:  Take over-the-counter and prescription medicines only as told by your health care provider.  Do toe straightening and stretching exercises as told by your health care provider.  Keep all   follow-up visits as told by your health care provider. This is important. How is this prevented?  Wear shoes that give your toes enough room and do not cause pain.  Do not wear high-heeled shoes. Contact a health care provider if:  Your pain gets worse.  Your toe becomes red or swollen.  You develop an open sore on your toe. This information is not intended to replace advice given to you by your health care provider. Make sure you discuss any questions you have with your health care  provider. Document Released: 10/28/2000 Document Revised: 05/20/2016 Document Reviewed: 02/24/2016 Elsevier Interactive Patient Education  2018 Elsevier Inc.  

## 2018-02-28 DIAGNOSIS — M2041 Other hammer toe(s) (acquired), right foot: Secondary | ICD-10-CM | POA: Diagnosis not present

## 2018-02-28 DIAGNOSIS — M2042 Other hammer toe(s) (acquired), left foot: Secondary | ICD-10-CM | POA: Diagnosis not present

## 2018-02-28 DIAGNOSIS — M779 Enthesopathy, unspecified: Secondary | ICD-10-CM | POA: Diagnosis not present

## 2018-02-28 MED ORDER — TRIAMCINOLONE ACETONIDE 10 MG/ML IJ SUSP
10.0000 mg | Freq: Once | INTRAMUSCULAR | Status: AC
  Administered 2018-02-28: 10 mg

## 2018-02-28 NOTE — Progress Notes (Signed)
Subjective:   Patient ID: Sharon Wells, female   DOB: 57 y.o.   MRN: [id number]   HPI Patient presents with painful inflamed area around the fifth toe of the left foot that makes walking difficult and shoe gear difficult.  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  I reviewed the procedure and gave her information on hammertoe and its condition and repair and at this time will go still focus conservatively I did a proximal nerve block of the toe after sterile prep of the toe I did inject the interphalangeal joint 1 mg dexamethasone 2 mg Xylocaine debrided the lesion and applied padding and will be seen back again in approximately 3 months

## 2018-05-24 ENCOUNTER — Encounter: Payer: Self-pay | Admitting: Internal Medicine

## 2018-05-24 DIAGNOSIS — Z1211 Encounter for screening for malignant neoplasm of colon: Secondary | ICD-10-CM

## 2018-05-25 NOTE — Telephone Encounter (Signed)
Please place order as  Requested by patient  For screening colon

## 2018-06-02 DIAGNOSIS — H5213 Myopia, bilateral: Secondary | ICD-10-CM | POA: Diagnosis not present

## 2018-06-05 ENCOUNTER — Telehealth: Payer: Self-pay | Admitting: Internal Medicine

## 2018-06-05 NOTE — Telephone Encounter (Signed)
Previous Colon report received and placed on Dr. Vena Rua desk for review.  Patient is requesting Dr. Hilarie Wells bc she works for [facility] and he is highly recommended.

## 2018-06-19 ENCOUNTER — Encounter: Payer: Self-pay | Admitting: Internal Medicine

## 2018-06-19 NOTE — Telephone Encounter (Signed)
Please advise Dr Regis Bill if any rec's, thanks.

## 2018-06-19 NOTE — Telephone Encounter (Signed)
Please  Tell  GI  She  needs colonoscopy on 5 year plan  as per patient hx of polyps  She will still need to get the  Report to Gi

## 2018-06-21 NOTE — Telephone Encounter (Signed)
Dr. Hilarie Fredrickson reviewed records and has accepted patient. Ok to schedule Direct Colon. Patient says that she will callback to schedule

## 2018-06-22 NOTE — Telephone Encounter (Signed)
Per GI notes and Christie with [facility], they have been trying to reach the patient to schedule.    Sharon Wells June 21, 2018 / 10:50 AM Note    Dr. Hilarie Fredrickson reviewed records and has accepted patient. Ok to schedule Direct Colon. Patient says that she will callback to schedule     Dow Adolph June 05, 2018 /  9:13 AM Note    Previous Colon report received and placed on Dr. Vena Rua desk for review.  Patient is requesting Dr. Hilarie Fredrickson bc she works for [facility] and he is highly recommended.

## 2018-08-20 ENCOUNTER — Encounter: Payer: 59 | Admitting: Internal Medicine

## 2018-08-23 ENCOUNTER — Other Ambulatory Visit: Payer: Self-pay | Admitting: Obstetrics & Gynecology

## 2018-08-23 ENCOUNTER — Ambulatory Visit (INDEPENDENT_AMBULATORY_CARE_PROVIDER_SITE_OTHER): Payer: 59 | Admitting: Obstetrics & Gynecology

## 2018-08-23 ENCOUNTER — Encounter: Payer: Self-pay | Admitting: Obstetrics & Gynecology

## 2018-08-23 VITALS — BP 140/80 | Ht 63.0 in | Wt 139.4 lb

## 2018-08-23 DIAGNOSIS — Z780 Asymptomatic menopausal state: Secondary | ICD-10-CM | POA: Diagnosis not present

## 2018-08-23 DIAGNOSIS — Z01419 Encounter for gynecological examination (general) (routine) without abnormal findings: Secondary | ICD-10-CM

## 2018-08-23 DIAGNOSIS — Z1231 Encounter for screening mammogram for malignant neoplasm of breast: Secondary | ICD-10-CM

## 2018-08-23 NOTE — Progress Notes (Signed)
Sharon Wells 10-23-1961 [id number]   History:    57 y.o. G3P3L3 Divorced  RP:  Established patient presenting for annual gyn exam   HPI: Menopause, well on no hormone replacement therapy.  No postmenopausal bleeding.  No pelvic pain.  Normal vaginal secretions.  Abstinent.  Breasts normal.  Good body mass index at 24.69.  Last weight with low calorie healthy nutrition and increase fitness.  Health labs with family physician.  Colonoscopy scheduled.  Past medical history,surgical history, family history and social history were all reviewed and documented in the EPIC chart.  Gynecologic History Patient's last menstrual period was 12/15/2013. Contraception: abstinence and post menopausal status Last Pap: 07/2016. Results were: Negative/HPV HR neg Last mammogram: 08/2017. Results were: Negative Bone Density: 12/2017 normal Colonoscopy: Scheduled   Obstetric History OB History  Gravida Para Term Preterm AB Living  3 3 3     3   SAB TAB Ectopic Multiple Live Births               # Outcome Date GA Lbr Len/2nd Weight Sex Delivery Anes PTL Lv  3 Term           2 Term           1 Term              ROS: A ROS was performed and pertinent positives and negatives are included in the history.  GENERAL: No fevers or chills. HEENT: No change in vision, no earache, sore throat or sinus congestion. NECK: No pain or stiffness. CARDIOVASCULAR: No chest pain or pressure. No palpitations. PULMONARY: No shortness of breath, cough or wheeze. GASTROINTESTINAL: No abdominal pain, nausea, vomiting or diarrhea, melena or bright red blood per rectum. GENITOURINARY: No urinary frequency, urgency, hesitancy or dysuria. MUSCULOSKELETAL: No joint or muscle pain, no back pain, no recent trauma. DERMATOLOGIC: No rash, no itching, no lesions. ENDOCRINE: No polyuria, polydipsia, no heat or cold intolerance. No recent change in weight. HEMATOLOGICAL: No anemia or easy bruising or bleeding. NEUROLOGIC: No headache,  seizures, numbness, tingling or weakness. PSYCHIATRIC: No depression, no loss of interest in normal activity or change in sleep pattern.     Exam:   BP 140/80   Ht 5\' 3"  (1.6 m)   Wt 139 lb 6.4 oz (63.2 kg)   LMP 12/15/2013   BMI 24.69 kg/m   Body mass index is 24.69 kg/m.  General appearance : Well developed well nourished female. No acute distress HEENT: Eyes: no retinal hemorrhage or exudates,  Neck supple, trachea midline, no carotid bruits, no thyroidmegaly Lungs: Clear to auscultation, no rhonchi or wheezes, or rib retractions  Heart: Regular rate and rhythm, no murmurs or gallops Breast:Examined in sitting and supine position were symmetrical in appearance, no palpable masses or tenderness,  no skin retraction, no nipple inversion, no nipple discharge, no skin discoloration, no axillary or supraclavicular lymphadenopathy Abdomen: no palpable masses or tenderness, no rebound or guarding Extremities: no edema or skin discoloration or tenderness  Pelvic: Vulva: Normal             Vagina: No gross lesions or discharge  Cervix: No gross lesions or discharge.  Pap reflex done  Uterus  AV, normal size, shape and consistency, non-tender and mobile  Adnexa  Without masses or tenderness  Anus: Normal   Assessment/Plan:  57 y.o. female for annual exam   1. Encounter for routine gynecological examination with Papanicolaou smear of cervix Normal gynecologic exam.  Pap reflex done.  Breast exam normal.  Schedule screening mammogram early November of this year.  Health labs with family physician.  Colonoscopy scheduled, coming soon.  Lost weight since last year with a normal body mass index now at 24.69.  Continue with healthy nutrition and fitness.  2. Post-menopausal Well on no hormone replacement therapy.  No postmenopausal bleeding.  Bone density normal in February 2019.  Vitamin D supplements, calcium intake of 1.5 g/day and regular weightbearing physical activity recommended.   Will repeat bone density in 5 years.  Princess Bruins MD, 8:33 AM 08/23/2018

## 2018-08-23 NOTE — Patient Instructions (Signed)
1. Encounter for routine gynecological examination with Papanicolaou smear of cervix Normal gynecologic exam.  Pap reflex done.  Breast exam normal.  Schedule screening mammogram early November of this year.  Health labs with family physician.  Colonoscopy scheduled, coming soon.  Lost weight since last year with a normal body mass index now at 24.69.  Continue with healthy nutrition and fitness.  2. Post-menopausal Well on no hormone replacement therapy.  No postmenopausal bleeding.  Bone density normal in February 2019.  Vitamin D supplements, calcium intake of 1.5 g/day and regular weightbearing physical activity recommended.  Will repeat bone density in 5 years.  Sharon Wells, it was a pleasure seeing you today!  I will inform you of your results as soon as they are available.

## 2018-08-23 NOTE — Addendum Note (Signed)
Addended by: Thurnell Garbe A on: 08/23/2018 09:44 AM   Modules accepted: Orders

## 2018-08-24 LAB — PAP IG W/ RFLX HPV ASCU

## 2018-08-27 ENCOUNTER — Encounter: Payer: Self-pay | Admitting: *Deleted

## 2018-09-13 ENCOUNTER — Encounter: Payer: Self-pay | Admitting: Podiatry

## 2018-09-13 ENCOUNTER — Ambulatory Visit: Payer: 59 | Admitting: Podiatry

## 2018-09-13 DIAGNOSIS — L84 Corns and callosities: Secondary | ICD-10-CM

## 2018-09-13 DIAGNOSIS — M2042 Other hammer toe(s) (acquired), left foot: Secondary | ICD-10-CM

## 2018-09-13 DIAGNOSIS — M2041 Other hammer toe(s) (acquired), right foot: Secondary | ICD-10-CM

## 2018-09-13 NOTE — Progress Notes (Signed)
Subjective:   Patient ID: Sharon Wells, female   DOB: 57 y.o.   MRN: [id number]   HPI Patient presents with significant digital deformity digit 5 both feet that are painful with thick tissue formation and inability to wear shoe gear comfortably.  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  We are going to continue to defer on this and today sharp sterile debridement of each lesion was accomplished with no iatrogenic bleeding this will be done as needed with consideration for surgery and patient again does understand what it would require

## 2018-09-19 ENCOUNTER — Ambulatory Visit (AMBULATORY_SURGERY_CENTER): Payer: 59

## 2018-09-19 ENCOUNTER — Encounter: Payer: Self-pay | Admitting: Internal Medicine

## 2018-09-19 VITALS — Ht 64.0 in | Wt 141.6 lb

## 2018-09-19 DIAGNOSIS — Z1211 Encounter for screening for malignant neoplasm of colon: Secondary | ICD-10-CM

## 2018-09-19 MED ORDER — NA SULFATE-K SULFATE-MG SULF 17.5-3.13-1.6 GM/177ML PO SOLN: 1.0000 | Freq: Once | ORAL | 0 refills | Status: AC

## 2018-09-19 MED FILL — SUPREP BOWEL PREP KIT: 17.5-3.13-1 | 1 days supply | Qty: 354 | Fill #0

## 2018-09-19 NOTE — Progress Notes (Signed)
Per pt, she is ALLERGIC  to egg products! No allergies to soy products.  Pt not taking any weight loss meds or using  O2 at home. Did not ask pt about emmi video.

## 2018-09-25 ENCOUNTER — Ambulatory Visit
Admission: RE | Admit: 2018-09-25 | Discharge: 2018-09-25 | Disposition: A | Discharge status: Home / Self Care | Payer: 59 | Source: Ambulatory Visit | Attending: Obstetrics & Gynecology | Admitting: Obstetrics & Gynecology

## 2018-09-25 DIAGNOSIS — Z1231 Encounter for screening mammogram for malignant neoplasm of breast: Secondary | ICD-10-CM

## 2018-10-03 ENCOUNTER — Encounter: Payer: Self-pay | Admitting: Internal Medicine

## 2018-10-03 ENCOUNTER — Ambulatory Visit (AMBULATORY_SURGERY_CENTER): Payer: 59 | Admitting: Internal Medicine

## 2018-10-03 VITALS — BP 107/82 | HR 68 | Temp 99.1°F | Resp 12 | Ht 64.0 in | Wt 141.0 lb

## 2018-10-03 DIAGNOSIS — D214 Benign neoplasm of connective and other soft tissue of abdomen: Secondary | ICD-10-CM

## 2018-10-03 DIAGNOSIS — Z1211 Encounter for screening for malignant neoplasm of colon: Secondary | ICD-10-CM | POA: Diagnosis not present

## 2018-10-03 DIAGNOSIS — Z86010 Personal history of colonic polyps: Secondary | ICD-10-CM | POA: Diagnosis not present

## 2018-10-03 DIAGNOSIS — E7800 Pure hypercholesterolemia, unspecified: Secondary | ICD-10-CM | POA: Diagnosis not present

## 2018-10-03 DIAGNOSIS — D128 Benign neoplasm of rectum: Secondary | ICD-10-CM

## 2018-10-03 MED ORDER — SODIUM CHLORIDE 0.9 % IV SOLN: 500.0000 mL | Freq: Once | INTRAVENOUS | Status: DC

## 2018-10-03 NOTE — Progress Notes (Signed)
Called to room to assist during endoscopic procedure.  Patient ID and intended procedure confirmed with present staff. Received instructions for my participation in the procedure from the performing physician.  

## 2018-10-03 NOTE — Progress Notes (Signed)
Report given to PACU, vss 

## 2018-10-03 NOTE — Patient Instructions (Signed)
YOU HAD AN ENDOSCOPIC PROCEDURE TODAY AT THE [facility]:   Refer to the procedure report that was given to you for any specific questions about what was found during the examination.  If the procedure report does not answer your questions, please call your gastroenterologist to clarify.  If you requested that your care partner not be given the details of your procedure findings, then the procedure report has been included in a sealed envelope for you to review at your convenience later.  YOU SHOULD EXPECT: Some feelings of bloating in the abdomen. Passage of more gas than usual.  Walking can help get rid of the air that was put into your GI tract during the procedure and reduce the bloating. If you had a lower endoscopy (such as a colonoscopy or flexible sigmoidoscopy) you may notice spotting of blood in your stool or on the toilet paper. If you underwent a bowel prep for your procedure, you may not have a normal bowel movement for a few days.  Please Note:  You might notice some irritation and congestion in your nose or some drainage.  This is from the oxygen used during your procedure.  There is no need for concern and it should clear up in a day or so.  SYMPTOMS TO REPORT IMMEDIATELY:   Following lower endoscopy (colonoscopy or flexible sigmoidoscopy):  Excessive amounts of blood in the stool  Significant tenderness or worsening of abdominal pains  Swelling of the abdomen that is new, acute  Fever of 100F or higher  For urgent or emergent issues, a gastroenterologist can be reached at any hour by calling [phone].   DIET:  We do recommend a small meal at first, but then you may proceed to your regular diet.  Drink plenty of fluids but you should avoid alcoholic beverages for 24 hours.  ACTIVITY:  You should plan to take it easy for the rest of today and you should NOT DRIVE or use heavy machinery until tomorrow (because of the sedation medicines used during the test).     FOLLOW UP: Our staff will call the number listed on your records the next business day following your procedure to check on you and address any questions or concerns that you may have regarding the information given to you following your procedure. If we do not reach you, we will leave a message.  However, if you are feeling well and you are not experiencing any problems, there is no need to return our call.  We will assume that you have returned to your regular daily activities without incident.  If any biopsies were taken you will be contacted by phone or by letter within the next 1-3 weeks.  Please call us at [phone] if you have not heard about the biopsies in 3 weeks.    SIGNATURES/CONFIDENTIALITY: You and/or your care partner have signed paperwork which will be entered into your electronic medical record.  These signatures attest to the fact that that the information above on your After Visit Summary has been reviewed and is understood.  Full responsibility of the confidentiality of this discharge information lies with you and/or your care-partner  Polyp, diverticulosis, and hemorrhoid information given.. 

## 2018-10-03 NOTE — Op Note (Addendum)
[place] Patient Name: Sharon Wells Procedure Date: 10/03/2018 10:49 AM MRN: [id number] Endoscopist: Jerene Bears , MD Age: 57 Referring MD:  Date of Birth: 1961/05/16 Gender: Female Account #: [id number] Procedure:                Colonoscopy Indications:              High risk colon cancer surveillance: Personal                            history of colonic polyps, Last colonoscopy: 2013 Medicines:                Monitored Anesthesia Care Procedure:                Pre-Anesthesia Assessment:                           - Prior to the procedure, a History and Physical                            was performed, and patient medications and                            allergies were reviewed. The patient's tolerance of                            previous anesthesia was also reviewed. The risks                            and benefits of the procedure and the sedation                            options and risks were discussed with the patient.                            All questions were answered, and informed consent                            was obtained. Prior Anticoagulants: The patient has                            taken no previous anticoagulant or antiplatelet                            agents. ASA Grade Assessment: II - A patient with                            mild systemic disease. After reviewing the risks                            and benefits, the patient was deemed in                            satisfactory condition to undergo the procedure.  After obtaining informed consent, the colonoscope                            was passed under direct vision. Throughout the                            procedure, the patient's blood pressure, pulse, and                            oxygen saturations were monitored continuously. The                            Colonoscope was introduced through the anus and                            advanced to the  cecum, identified by appendiceal                            orifice and ileocecal valve. The colonoscopy was                            performed without difficulty. The patient tolerated                            the procedure well. The quality of the bowel                            preparation was good. The ileocecal valve,                            appendiceal orifice, and rectum were photographed. Scope In: 10:53:27 AM Scope Out: 11:08:27 AM Scope Withdrawal Time: 0 hours 10 minutes 57 seconds  Total Procedure Duration: 0 hours 15 minutes 0 seconds  Findings:                 The digital rectal exam was normal.                           A 5 mm polyp was found in the rectum. The polyp was                            sessile. The polyp was removed with a cold snare.                            Resection and retrieval were complete.                           A few small-mouthed diverticula were found in the                            sigmoid colon.                           Internal hemorrhoids were found during retroflexion.  The exam was otherwise without abnormality. Complications:            No immediate complications. Estimated Blood Loss:     Estimated blood loss was minimal. Impression:               - One 5 mm polyp in the rectum, removed with a cold                            snare. Resected and retrieved.                           - Diverticulosis in the sigmoid colon.                           - Internal hemorrhoids.                           - The examination was otherwise normal. Recommendation:           - Patient has a contact number available for                            emergencies. The signs and symptoms of potential                            delayed complications were discussed with the                            patient. Return to normal activities tomorrow.                            Written discharge instructions were provided to the                             patient.                           - Resume previous diet.                           - Continue present medications.                           - Await pathology results.                           - Repeat colonoscopy is recommended for                            surveillance. The colonoscopy date will be                            determined after pathology results from today's                            exam become available for review. Jerene Bears, MD 10/03/2018 11:11:10 AM This report has been signed electronically.

## 2018-10-04 ENCOUNTER — Telehealth: Payer: Self-pay

## 2018-10-04 NOTE — Telephone Encounter (Signed)
  Follow up Call-  Call back number 10/03/2018  Post procedure Call Back phone  # [phone]  Permission to leave phone message Yes  Some recent data might be hidden     Patient questions:  Do you have a fever, pain , or abdominal swelling? No. Pain Score  0 *  Have you tolerated food without any problems? Yes.    Have you been able to return to your normal activities? Yes.    Do you have any questions about your discharge instructions: Diet   No. Medications  No. Follow up visit  No.  Do you have questions or concerns about your Care? No.  Actions: * If pain score is 4 or above: No action needed, pain <4.

## 2018-10-09 ENCOUNTER — Encounter: Payer: Self-pay | Admitting: Internal Medicine

## 2018-10-15 ENCOUNTER — Encounter: Payer: Self-pay | Admitting: Internal Medicine

## 2018-10-15 ENCOUNTER — Telehealth: Payer: Self-pay | Admitting: Internal Medicine

## 2018-10-15 NOTE — Telephone Encounter (Signed)
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   The colon and rectum have a muscle layer beneath the inside surface (mucosa) and this is where this polyp was originating. It is benign and not felt to have precancerous potential; it was removed at her most recent exam Please let me know if she has additional questions or concerns

## 2018-10-15 NOTE — Telephone Encounter (Signed)
Please advise on what to inform the patient regarding the pathology results from the colonoscopy; Thank you

## 2018-10-16 NOTE — Telephone Encounter (Signed)
I have spoken to patient to advise of Dr Vena Rua interpretation of what was seen on colonoscopy as well as pathology. She verbalizes understanding and has no further questions. I advised she should also be receiving a letter in the mail with this information.

## 2018-11-15 ENCOUNTER — Ambulatory Visit: Payer: Self-pay | Admitting: Nurse Practitioner

## 2018-11-15 VITALS — BP 124/80 | HR 80 | Temp 98.7°F | Resp 18 | Wt 143.6 lb

## 2018-11-15 DIAGNOSIS — H6123 Impacted cerumen, bilateral: Secondary | ICD-10-CM

## 2018-11-15 NOTE — Patient Instructions (Signed)
Earwax Buildup, Adult -Use ear wax softener as directed. -You may return to our office every 1-2 months for ear irrigation as needed. -Do not stick anything in the ear canal. -Increase fluids. -Follow up in our office as needed.  The ears produce a substance called earwax that helps keep bacteria out of the ear and protects the skin in the ear canal. Occasionally, earwax can build up in the ear and cause discomfort or hearing loss. What increases the risk? This condition is more likely to develop in people who:  Are female.  Are elderly.  Naturally produce more earwax.  Clean their ears often with cotton swabs.  Use earplugs often.  Use in-ear headphones often.  Wear hearing aids.  Have narrow ear canals.  Have earwax that is overly thick or sticky.  Have eczema.  Are dehydrated.  Have excess hair in the ear canal. What are the signs or symptoms? Symptoms of this condition include:  Reduced or muffled hearing.  A feeling of fullness in the ear or feeling that the ear is plugged.  Fluid coming from the ear.  Ear pain.  Ear itch.  Ringing in the ear.  Coughing.  An obvious piece of earwax that can be seen inside the ear canal. How is this diagnosed? This condition may be diagnosed based on:  Your symptoms.  Your medical history.  An ear exam. During the exam, your health care provider will look into your ear with an instrument called an otoscope. You may have tests, including a hearing test. How is this treated? This condition may be treated by:  Using ear drops to soften the earwax.  Having the earwax removed by a health care provider. The health care provider may: ? Flush the ear with water. ? Use an instrument that has a loop on the end (curette). ? Use a suction device.  Surgery to remove the wax buildup. This may be done in severe cases. Follow these instructions at home:   Take over-the-counter and prescription medicines only as told by  your health care provider.  Do not put any objects, including cotton swabs, into your ear. You can clean the opening of your ear canal with a washcloth or facial tissue.  Follow instructions from your health care provider about cleaning your ears. Do not over-clean your ears.  Drink enough fluid to keep your urine clear or pale yellow. This will help to thin the earwax.  Keep all follow-up visits as told by your health care provider. If earwax builds up in your ears often or if you use hearing aids, consider seeing your health care provider for routine, preventive ear cleanings. Ask your health care provider how often you should schedule your cleanings.  If you have hearing aids, clean them according to instructions from the manufacturer and your health care provider. Contact a health care provider if:  You have ear pain.  You develop a fever.  You have blood, pus, or other fluid coming from your ear.  You have hearing loss.  You have ringing in your ears that does not go away.  Your symptoms do not improve with treatment.  You feel like the room is spinning (vertigo). Summary  Earwax can build up in the ear and cause discomfort or hearing loss.  The most common symptoms of this condition include reduced or muffled hearing and a feeling of fullness in the ear or feeling that the ear is plugged.  This condition may be diagnosed based on your  symptoms, your medical history, and an ear exam.  This condition may be treated by using ear drops to soften the earwax or by having the earwax removed by a health care provider.  Do not put any objects, including cotton swabs, into your ear. You can clean the opening of your ear canal with a washcloth or facial tissue. This information is not intended to replace advice given to you by your health care provider. Make sure you discuss any questions you have with your health care provider. Document Released: 12/08/2004 Document Revised:  10/12/2017 Document Reviewed: 01/11/2017 Elsevier Interactive Patient Education  2019 [employer].

## 2018-11-15 NOTE — Progress Notes (Signed)
Subjective:    Sharon Wells is a 58 y.o. female whom I am asked to see for evaluation of diminished hearing in the left ear for the past 7 days. There is a prior history of cerumen impaction. The patient has been using ear drops to loosen wax immediately prior to this visit. The patient denies ear pain.  The patient's history has been marked as reviewed and updated as appropriate. Past Medical History:  Diagnosis Date  . Chicken pox   . Fibroids    With history of anemia  . Hyperlipidemia    Current Outpatient Medications  Medication Sig Dispense Refill  . cyanocobalamin 2000 MCG tablet Take 2,000 mcg by mouth daily.    . NON FORMULARY daily. Neo[employer]: Multivitamin Complex with TRE-EN-EN Grain Concentrates     . Omega-3 Fatty Acids (FISH OIL) 1000 MG CAPS Take by mouth daily.      No current facility-administered medications for this visit.    Allergies: Eggs or egg-derived products  Review of Systems Constitutional: negative Eyes: negative Ears, nose, mouth, throat, and face: positive for muffled hearing, "feels like I am in a tunnel", negative for ear drainage, earaches, hoarseness, nasal congestion and sore throat Respiratory: negative Cardiovascular: negative Gastrointestinal: negative Neurological: negative    Objective:    Auditory canal(s) of both ears are partially obstructed with cerumen.   Cerumen was removed using gentle irrigation. Tympanic membranes are intact following the procedure.  Auditory canals are normal.    Assessment:    Cerumen Impaction without otitis externa.    Plan:   Exam findings, diagnosis etiology and medication use and indications reviewed with patient. Follow- Up and discharge instructions provided. No emergent/urgent issues found on exam. Patient education was provided. Patient verbalized understanding of information provided and agrees with plan of care (POC), all questions answered. The patient is advised to call or return to clinic if  condition does not see an improvement in symptoms, or to seek the care of the closest emergency department if condition worsens with the above plan.   1. Bilateral impacted cerumen  -Use ear wax softener as directed. -You may return to our office every 1-2 months for ear irrigation as needed. -Do not stick anything in the ear canal. -Increase fluids. -Follow up in our office as needed.

## 2019-01-13 NOTE — Progress Notes (Signed)
Chief Complaint  Patient presents with  . Annual Exam    pt is healthy and well has no concerns today     HPI: Patient  Sharon Wells  58 y.o. comes in today for Preventive Health Care visit  bp controlled with lsi and no meds  . bp has come down with weight reduction .  No specific concerns   Although recently not eating as well   Health Maintenance  Topic Date Due  . Hepatitis C Screening  August 16, 1961  . HIV Screening  10/19/1974  . INFLUENZA VACCINE  09/01/2049 (Originally 06/14/2018)  . MAMMOGRAM  09/25/2020  . PAP SMEAR-Modifier  08/23/2021  . TETANUS/TDAP  05/08/2023  . COLONOSCOPY  10/04/2023   Health Maintenance Review LIFESTYLE:  Exercise:    exercise 2-3 x per week attending to weight and bp with  lsi  Tobacco/ETS:n Alcohol: n Sugar beverages: sweet tea some but cut down  Sleep:ok Drug use: no HH of 1 Work: ft no depression   Cannot take  Flu vaccine went to allergist and     ROS:  GEN/ HEENT: No fever, significant weight changes sweats headaches vision problems hearing changes, CV/ PULM; No chest pain shortness of breath cough, syncope,edema  change in exercise tolerance. GI /GU: No adominal pain, vomiting, change in bowel habits. No blood in the stool. No significant GU symptoms. SKIN/HEME: ,no acute skin rashes suspicious lesions or bleeding. No lymphadenopathy, nodules, masses.  NEURO/ PSYCH:  No neurologic signs such as weakness numbness. No depression anxiety. IMM/ Allergy: No unusual infections.  Allergy .   REST of 12 system review negative except as per HPI   Past Medical History:  Diagnosis Date  . Chicken pox   . Fibroids    With history of anemia  . Hyperlipidemia     Past Surgical History:  Procedure Laterality Date  . COLONOSCOPY  2013   In [place]  . NO PAST SURGERIES      Family History  Problem Relation Age of Onset  . Sarcoidosis Mother        Deceased in her 2s  . Hypertension Sister   . Diabetes Sister   .  Sarcoidosis Sister   . COPD Sister        Emphysema  . Pulmonary embolism Sister        Apparently am provoked  . Heart disease Maternal Grandmother   . Breast cancer Neg Hx   . Colon cancer Neg Hx   . Esophageal cancer Neg Hx   . Stomach cancer Neg Hx   . Rectal cancer Neg Hx     Social History   Socioeconomic History  . Marital status: Divorced    Spouse name: Not on file  . Number of children: Not on file  . Years of education: Not on file  . Highest education level: Not on file  Occupational History  . Not on file  Social Needs  . Financial resource strain: Not on file  . Food insecurity:    Worry: Not on file    Inability: Not on file  . Transportation needs:    Medical: Not on file    Non-medical: Not on file  Tobacco Use  . Smoking status: Never Smoker  . Smokeless tobacco: Never Used  Substance and Sexual Activity  . Alcohol use: No  . Drug use: No  . Sexual activity: Not Currently    Comment: 1ST intercourse- 15, partners- 2,   Lifestyle  . Physical  activity:    Days per week: Not on file    Minutes per session: Not on file  . Stress: Not on file  Relationships  . Social connections:    Talks on phone: Not on file    Gets together: Not on file    Attends religious service: Not on file    Active member of club or organization: Not on file    Attends meetings of clubs or organizations: Not on file    Relationship status: Not on file  Other Topics Concern  . 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    Outpatient Medications Prior to Visit  Medication Sig Dispense Refill  . cyanocobalamin 2000 MCG tablet Take 2,000 mcg by mouth daily.    . NON  FORMULARY daily. [employer]: Multivitamin Complex with TRE-EN-EN Grain Concentrates     . Omega-3 Fatty Acids (FISH OIL) 1000 MG CAPS Take by mouth daily.      No facility-administered medications prior to visit.      EXAM:  BP 138/76 (BP Location: Right Arm, Patient Position: Sitting, Cuff Size: Normal)   Pulse 77   Temp 98.6 F (37 C) (Oral)   Ht '5\' 3"'  (1.6 m)   Wt 146 lb 9.6 oz (66.5 kg)   LMP 12/15/2013   BMI 25.97 kg/m   Body mass index is 25.97 kg/m. Wt Readings from Last 3 Encounters:  01/14/19 146 lb 9.6 oz (66.5 kg)  11/15/18 143 lb 9.6 oz (65.1 kg)  10/03/18 141 lb (64 kg)    Physical Exam: Vital signs reviewed [id number] is a well-developed well-nourished alert cooperative    who appearsr stated age in no acute distress.  HEENT: normocephalic atraumatic , Eyes: PERRL EOM's full, conjunctiva clear, Nares: paten,t no deformity discharge or tenderness., Ears: no deformity EAC's clear TMs with normal landmarks. Mouth: clear OP, no lesions, edema.  Moist mucous membranes. Dentition in adequate repair. NECK: supple without masses, thyromegaly or bruits. CHEST/PULM:  Clear to auscultation and percussion breath sounds equal no wheeze , rales or rhonchi. No chest wall deformities or tenderness. Breast: normal by inspection . No dimpling, discharge, masses, tenderness or discharge . CV: PMI is nondisplaced, S1 S2 no gallops, murmurs, rubs. Peripheral pulses are full without delay.No JVD .  ABDOMEN: Bowel sounds normal nontender  No guard or rebound, no hepato splenomegal no CVA tenderness.  No hernia. Extremtities:  No clubbing cyanosis or edema, no acute joint swelling or redness no focal atrophy NEURO:  Oriented x3, cranial nerves 3-12 appear to be intact, no obvious focal weakness,gait within normal limits no abnormal reflexes or asymmetrical SKIN: No acute rashes normal turgor, color, no bruising or petechiae. PSYCH: Oriented, good eye contact, no obvious depression anxiety,  cognition and judgment appear normal. LN: no cervical axillary inguinal adenopathy  Lab Results  Component Value Date   WBC 3.6 (L) 05/02/2017   HGB 12.8 05/02/2017   HCT 38.7 05/02/2017   PLT 291.0 05/02/2017   GLUCOSE 99 05/02/2017   CHOL 237 (H) 05/02/2017   TRIG 177.0 (H) 05/02/2017   HDL 41.30 05/02/2017  LDLCALC 160 (H) 05/02/2017   ALT 12 05/02/2017   AST 13 05/02/2017   NA 139 05/02/2017   K 4.1 05/02/2017   CL 101 05/02/2017   CREATININE 0.62 05/02/2017   BUN 16 05/02/2017   CO2 30 05/02/2017   TSH 1.22 05/02/2017   HGBA1C 6.0 05/02/2017    BP Readings from Last 3 Encounters:  01/14/19 138/76  11/15/18 124/80  10/03/18 107/82    Lab results reviewed with patient   ASSESSMENT AND PLAN:  Discussed the following assessment and plan:  Visit for preventive health examination - Plan: Basic metabolic panel, CBC with Differential/Platelet, Hemoglobin A1c, Hepatic function panel, Lipid panel, TSH  Medication management  Hyperglycemia - Plan: Basic metabolic panel, CBC with Differential/Platelet, Hemoglobin A1c, Hepatic function panel, Lipid panel, TSH  Hyperlipidemia, unspecified hyperlipidemia type - Plan: Basic metabolic panel, CBC with Differential/Platelet, Hemoglobin A1c, Hepatic function panel, Lipid panel, TSH  Essential hypertension - Plan: Basic metabolic panel, CBC with Differential/Platelet, Hemoglobin A1c, Hepatic function panel, Lipid panel, TSH Fasting   Today   Agree with lsi  Counseling   Patient Care Team: Meleana Commerford, Standley Brooking, MD as PCP - General (Internal Medicine) Princess Bruins, MD as Consulting Physician (Obstetrics and Gynecology) Patient Instructions   Continue lifestyle intervention healthy eating and exercise .   Glad you are doing well .   bp goal 120/80  Wt Readings from Last 3 Encounters:  01/14/19 146 lb 9.6 oz (66.5 kg)  11/15/18 143 lb 9.6 oz (65.1 kg)  10/03/18 141 lb (64 kg)   Health Maintenance, Female Adopting a  healthy lifestyle and getting preventive care can go a long way to promote health and wellness. Talk with your health care provider about what schedule of regular examinations is right for you. This is a good chance for you to check in with your provider about disease prevention and staying healthy. In between checkups, there are plenty of things you can do on your own. Experts have done a lot of research about which lifestyle changes and preventive measures are most likely to keep you healthy. Ask your health care provider for more information. Weight and diet Eat a healthy diet  Be sure to include plenty of vegetables, fruits, low-fat dairy products, and lean protein.  Do not eat a lot of foods high in solid fats, added sugars, or salt.  Get regular exercise. This is one of the most important things you can do for your health. ? Most adults should exercise for at least 150 minutes each week. The exercise should increase your heart rate and make you sweat (moderate-intensity exercise). ? Most adults should also do strengthening exercises at least twice a week. This is in addition to the moderate-intensity exercise. Maintain a healthy weight  Body mass index (BMI) is a measurement that can be used to identify possible weight problems. It estimates body fat based on height and weight. Your health care provider can help determine your BMI and help you achieve or maintain a healthy weight.  For females 12 years of age and older: ? A BMI below 18.5 is considered underweight. ? A BMI of 18.5 to 24.9 is normal. ? A BMI of 25 to 29.9 is considered overweight. ? A BMI of 30 and above is considered obese. Watch levels of cholesterol and blood lipids  You should start having your blood tested for lipids and cholesterol at 60 years of age, then have this test every 5 years.  You may need to have your  cholesterol levels checked more often if: ? Your lipid or cholesterol levels are high. ? You are  older than 60 years of age. ? You are at high risk for heart disease. Cancer screening Lung Cancer  Lung cancer screening is recommended for adults 74-7 years old who are at high risk for lung cancer because of a history of smoking.  A yearly low-dose CT scan of the lungs is recommended for people who: ? Currently smoke. ? Have quit within the past 15 years. ? Have at least a 30-pack-year history of smoking. A pack year is smoking an average of one pack of cigarettes a day for 1 year.  Yearly screening should continue until it has been 15 years since you quit.  Yearly screening should stop if you develop a health problem that would prevent you from having lung cancer treatment. Breast Cancer  Practice breast self-awareness. This means understanding how your breasts normally appear and feel.  It also means doing regular breast self-exams. Let your health care provider know about any changes, no matter how small.  If you are in your 20s or 30s, you should have a clinical breast exam (CBE) by a health care provider every 1-3 years as part of a regular health exam.  If you are 15 or older, have a CBE every year. Also consider having a breast X-ray (mammogram) every year.  If you have a family history of breast cancer, talk to your health care provider about genetic screening.  If you are at high risk for breast cancer, talk to your health care provider about having an MRI and a mammogram every year.  Breast cancer gene (BRCA) assessment is recommended for women who have family members with BRCA-related cancers. BRCA-related cancers include: ? Breast. ? Ovarian. ? Tubal. ? Peritoneal cancers.  Results of the assessment will determine the need for genetic counseling and BRCA1 and BRCA2 testing. Cervical Cancer Your health care provider may recommend that you be screened regularly for cancer of the pelvic organs (ovaries, uterus, and vagina). This screening involves a pelvic  examination, including checking for microscopic changes to the surface of your cervix (Pap test). You may be encouraged to have this screening done every 3 years, beginning at age 61.  For women ages 51-65, health care providers may recommend pelvic exams and Pap testing every 3 years, or they may recommend the Pap and pelvic exam, combined with testing for human papilloma virus (HPV), every 5 years. Some types of HPV increase your risk of cervical cancer. Testing for HPV may also be done on women of any age with unclear Pap test results.  Other health care providers may not recommend any screening for nonpregnant women who are considered low risk for pelvic cancer and who do not have symptoms. Ask your health care provider if a screening pelvic exam is right for you.  If you have had past treatment for cervical cancer or a condition that could lead to cancer, you need Pap tests and screening for cancer for at least 20 years after your treatment. If Pap tests have been discontinued, your risk factors (such as having a new sexual partner) need to be reassessed to determine if screening should resume. Some women have medical problems that increase the chance of getting cervical cancer. In these cases, your health care provider may recommend more frequent screening and Pap tests. Colorectal Cancer  This type of cancer can be detected and often prevented.  Routine colorectal cancer screening usually begins  at 60 years of age and continues through 60 years of age.  Your health care provider may recommend screening at an earlier age if you have risk factors for colon cancer.  Your health care provider may also recommend using home test kits to check for hidden blood in the stool.  A small camera at the end of a tube can be used to examine your colon directly (sigmoidoscopy or colonoscopy). This is done to check for the earliest forms of colorectal cancer.  Routine screening usually begins at age  67.  Direct examination of the colon should be repeated every 5-10 years through 60 years of age. However, you may need to be screened more often if early forms of precancerous polyps or small growths are found. Skin Cancer  Check your skin from head to toe regularly.  Tell your health care provider about any new moles or changes in moles, especially if there is a change in a mole's shape or color.  Also tell your health care provider if you have a mole that is larger than the size of a pencil eraser.  Always use sunscreen. Apply sunscreen liberally and repeatedly throughout the day.  Protect yourself by wearing long sleeves, pants, a wide-brimmed hat, and sunglasses whenever you are outside. Heart disease, diabetes, and high blood pressure  High blood pressure causes heart disease and increases the risk of stroke. High blood pressure is more likely to develop in: ? People who have blood pressure in the high end of the normal range (130-139/85-89 mm Hg). ? People who are overweight or obese. ? People who are African American.  If you are 28-57 years of age, have your blood pressure checked every 3-5 years. If you are 24 years of age or older, have your blood pressure checked every year. You should have your blood pressure measured twice-once when you are at a hospital or clinic, and once when you are not at a hospital or clinic. Record the average of the two measurements. To check your blood pressure when you are not at a hospital or clinic, you can use: ? An automated blood pressure machine at a pharmacy. ? A home blood pressure monitor.  If you are between 68 years and 96 years old, ask your health care provider if you should take aspirin to prevent strokes.  Have regular diabetes screenings. This involves taking a blood sample to check your fasting blood sugar level. ? If you are at a normal weight and have a low risk for diabetes, have this test once every three years after 60 years  of age. ? If you are overweight and have a high risk for diabetes, consider being tested at a younger age or more often. Preventing infection Hepatitis B  If you have a higher risk for hepatitis B, you should be screened for this virus. You are considered at high risk for hepatitis B if: ? You were born in a country where hepatitis B is common. Ask your health care provider which countries are considered high risk. ? Your parents were born in a high-risk country, and you have not been immunized against hepatitis B (hepatitis B vaccine). ? You have HIV or AIDS. ? You use needles to inject street drugs. ? You live with someone who has hepatitis B. ? You have had sex with someone who has hepatitis B. ? You get hemodialysis treatment. ? You take certain medicines for conditions, including cancer, organ transplantation, and autoimmune conditions. Hepatitis C  Blood  testing is recommended for: ? Everyone born from [birth year] through 1967. ? Anyone with known risk factors for hepatitis C. Sexually transmitted infections (STIs)  You should be screened for sexually transmitted infections (STIs) including gonorrhea and chlamydia if: ? You are sexually active and are younger than 59 years of age. ? You are older than 60 years of age and your health care provider tells you that you are at risk for this type of infection. ? Your sexual activity has changed since you were last screened and you are at an increased risk for chlamydia or gonorrhea. Ask your health care provider if you are at risk.  If you do not have HIV, but are at risk, it may be recommended that you take a prescription medicine daily to prevent HIV infection. This is called pre-exposure prophylaxis (PrEP). You are considered at risk if: ? You are sexually active and do not regularly use condoms or know the HIV status of your partner(s). ? You take drugs by injection. ? You are sexually active with a partner who has HIV. Talk with your  health care provider about whether you are at high risk of being infected with HIV. If you choose to begin PrEP, you should first be tested for HIV. You should then be tested every 3 months for as long as you are taking PrEP. Pregnancy  If you are premenopausal and you may become pregnant, ask your health care provider about preconception counseling.  If you may become pregnant, take 400 to 800 micrograms (mcg) of folic acid every day.  If you want to prevent pregnancy, talk to your health care provider about birth control (contraception). Osteoporosis and menopause  Osteoporosis is a disease in which the bones lose minerals and strength with aging. This can result in serious bone fractures. Your risk for osteoporosis can be identified using a bone density scan.  If you are 71 years of age or older, or if you are at risk for osteoporosis and fractures, ask your health care provider if you should be screened.  Ask your health care provider whether you should take a calcium or vitamin D supplement to lower your risk for osteoporosis.  Menopause may have certain physical symptoms and risks.  Hormone replacement therapy may reduce some of these symptoms and risks. Talk to your health care provider about whether hormone replacement therapy is right for you. Follow these instructions at home:  Schedule regular health, dental, and eye exams.  Stay current with your immunizations.  Do not use any tobacco products including cigarettes, chewing tobacco, or electronic cigarettes.  If you are pregnant, do not drink alcohol.  If you are breastfeeding, limit how much and how often you drink alcohol.  Limit alcohol intake to no more than 1 drink per day for nonpregnant women. One drink equals 12 ounces of beer, 5 ounces of wine, or 1 ounces of hard liquor.  Do not use street drugs.  Do not share needles.  Ask your health care provider for help if you need support or information about quitting  drugs.  Tell your health care provider if you often feel depressed.  Tell your health care provider if you have ever been abused or do not feel safe at home. This information is not intended to replace advice given to you by your health care provider. Make sure you discuss any questions you have with your health care provider. Document Released: 05/16/2011 Document Revised: 04/07/2016 Document Reviewed: 08/04/2015 Elsevier Interactive Patient Education  2019 [place] Deeksha Cotrell M.D.

## 2019-01-14 ENCOUNTER — Ambulatory Visit (INDEPENDENT_AMBULATORY_CARE_PROVIDER_SITE_OTHER): Payer: 59 | Admitting: Internal Medicine

## 2019-01-14 ENCOUNTER — Encounter: Payer: Self-pay | Admitting: Internal Medicine

## 2019-01-14 VITALS — BP 138/76 | HR 77 | Temp 98.6°F | Ht 63.0 in | Wt 146.6 lb

## 2019-01-14 DIAGNOSIS — E785 Hyperlipidemia, unspecified: Secondary | ICD-10-CM

## 2019-01-14 DIAGNOSIS — Z79899 Other long term (current) drug therapy: Secondary | ICD-10-CM | POA: Diagnosis not present

## 2019-01-14 DIAGNOSIS — Z0000 Encounter for general adult medical examination without abnormal findings: Secondary | ICD-10-CM

## 2019-01-14 DIAGNOSIS — R739 Hyperglycemia, unspecified: Secondary | ICD-10-CM

## 2019-01-14 DIAGNOSIS — I10 Essential (primary) hypertension: Secondary | ICD-10-CM | POA: Diagnosis not present

## 2019-01-14 LAB — LIPID PANEL
CHOLESTEROL: 254 mg/dL — AB (ref 0–200)
HDL: 50.3 mg/dL (ref >39.00)
LDL CALC: 170 mg/dL — AB (ref 0–99)
NonHDL: 203.98
TRIGLYCERIDES: 172 mg/dL — AB (ref 0.0–149.0)
Total CHOL/HDL Ratio: 5
VLDL: 34.4 mg/dL (ref 0.0–40.0)

## 2019-01-14 LAB — HEPATIC FUNCTION PANEL
ALBUMIN: 4.4 g/dL (ref 3.5–5.2)
ALT: 12 U/L (ref 0–35)
AST: 15 U/L (ref 0–37)
Alkaline Phosphatase: 61 U/L (ref 39–117)
BILIRUBIN TOTAL: 0.4 mg/dL (ref 0.2–1.2)
Bilirubin, Direct: 0.1 mg/dL (ref 0.0–0.3)
Total Protein: 6.9 g/dL (ref 6.0–8.3)

## 2019-01-14 LAB — CBC WITH DIFFERENTIAL/PLATELET
BASOS PCT: 1.3 % (ref 0.0–3.0)
Basophils Absolute: 0 10*3/uL (ref 0.0–0.1)
EOS ABS: 0.1 10*3/uL (ref 0.0–0.7)
Eosinophils Relative: 1.7 % (ref 0.0–5.0)
HCT: 37.8 % (ref 36.0–46.0)
Hemoglobin: 12.6 g/dL (ref 12.0–15.0)
LYMPHS ABS: 1.7 10*3/uL (ref 0.7–4.0)
Lymphocytes Relative: 49.3 % — ABNORMAL HIGH (ref 12.0–46.0)
MCHC: 33.3 g/dL (ref 30.0–36.0)
MCV: 85.5 fl (ref 78.0–100.0)
Monocytes Absolute: 0.2 10*3/uL (ref 0.1–1.0)
Monocytes Relative: 6.4 % (ref 3.0–12.0)
NEUTROS ABS: 1.4 10*3/uL (ref 1.4–7.7)
Neutrophils Relative %: 41.3 % — ABNORMAL LOW (ref 43.0–77.0)
PLATELETS: 268 10*3/uL (ref 150.0–400.0)
RBC: 4.42 Mil/uL (ref 3.87–5.11)
RDW: 12.9 % (ref 11.5–15.5)
WBC: 3.4 10*3/uL — ABNORMAL LOW (ref 4.0–10.5)

## 2019-01-14 LAB — BASIC METABOLIC PANEL
BUN: 10 mg/dL (ref 6–23)
CHLORIDE: 103 meq/L (ref 96–112)
CO2: 31 mEq/L (ref 19–32)
CREATININE: 0.62 mg/dL (ref 0.40–1.20)
Calcium: 9.6 mg/dL (ref 8.4–10.5)
GFR: 119.12 mL/min (ref >60.00)
GLUCOSE: 91 mg/dL (ref 70–99)
Potassium: 4.1 mEq/L (ref 3.5–5.1)
Sodium: 141 mEq/L (ref 135–145)

## 2019-01-14 LAB — HEMOGLOBIN A1C: Hgb A1c MFr Bld: 5.8 % (ref 4.6–6.5)

## 2019-01-14 LAB — TSH: TSH: 1.04 u[IU]/mL (ref 0.35–4.50)

## 2019-01-14 NOTE — Patient Instructions (Addendum)
Continue lifestyle intervention healthy eating and exercise .   Glad you are doing well .   bp goal 120/80  Wt Readings from Last 3 Encounters:  01/14/19 146 lb 9.6 oz (66.5 kg)  11/15/18 143 lb 9.6 oz (65.1 kg)  10/03/18 141 lb (64 kg)   Health Maintenance, Female Adopting a healthy lifestyle and getting preventive care can go a long way to promote health and wellness. Talk with your health care provider about what schedule of regular examinations is right for you. This is a good chance for you to check in with your provider about disease prevention and staying healthy. In between checkups, there are plenty of things you can do on your own. Experts have done a lot of research about which lifestyle changes and preventive measures are most likely to keep you healthy. Ask your health care provider for more information. Weight and diet Eat a healthy diet  Be sure to include plenty of vegetables, fruits, low-fat dairy products, and lean protein.  Do not eat a lot of foods high in solid fats, added sugars, or salt.  Get regular exercise. This is one of the most important things you can do for your health. ? Most adults should exercise for at least 150 minutes each week. The exercise should increase your heart rate and make you sweat (moderate-intensity exercise). ? Most adults should also do strengthening exercises at least twice a week. This is in addition to the moderate-intensity exercise. Maintain a healthy weight  Body mass index (BMI) is a measurement that can be used to identify possible weight problems. It estimates body fat based on height and weight. Your health care provider can help determine your BMI and help you achieve or maintain a healthy weight.  For females 37 years of age and older: ? A BMI below 18.5 is considered underweight. ? A BMI of 18.5 to 24.9 is normal. ? A BMI of 25 to 29.9 is considered overweight. ? A BMI of 30 and above is considered obese. Watch levels of  cholesterol and blood lipids  You should start having your blood tested for lipids and cholesterol at 60 years of age, then have this test every 5 years.  You may need to have your cholesterol levels checked more often if: ? Your lipid or cholesterol levels are high. ? You are older than 60 years of age. ? You are at high risk for heart disease. Cancer screening Lung Cancer  Lung cancer screening is recommended for adults 66-24 years old who are at high risk for lung cancer because of a history of smoking.  A yearly low-dose CT scan of the lungs is recommended for people who: ? Currently smoke. ? Have quit within the past 15 years. ? Have at least a 30-pack-year history of smoking. A pack year is smoking an average of one pack of cigarettes a day for 1 year.  Yearly screening should continue until it has been 15 years since you quit.  Yearly screening should stop if you develop a health problem that would prevent you from having lung cancer treatment. Breast Cancer  Practice breast self-awareness. This means understanding how your breasts normally appear and feel.  It also means doing regular breast self-exams. Let your health care provider know about any changes, no matter how small.  If you are in your 20s or 30s, you should have a clinical breast exam (CBE) by a health care provider every 1-3 years as part of a regular health  exam.  If you are 40 or older, have a CBE every year. Also consider having a breast X-ray (mammogram) every year.  If you have a family history of breast cancer, talk to your health care provider about genetic screening.  If you are at high risk for breast cancer, talk to your health care provider about having an MRI and a mammogram every year.  Breast cancer gene (BRCA) assessment is recommended for women who have family members with BRCA-related cancers. BRCA-related cancers include: ? Breast. ? Ovarian. ? Tubal. ? Peritoneal cancers.  Results of  the assessment will determine the need for genetic counseling and BRCA1 and BRCA2 testing. Cervical Cancer Your health care provider may recommend that you be screened regularly for cancer of the pelvic organs (ovaries, uterus, and vagina). This screening involves a pelvic examination, including checking for microscopic changes to the surface of your cervix (Pap test). You may be encouraged to have this screening done every 3 years, beginning at age 60.  For women ages 65-65, health care providers may recommend pelvic exams and Pap testing every 3 years, or they may recommend the Pap and pelvic exam, combined with testing for human papilloma virus (HPV), every 5 years. Some types of HPV increase your risk of cervical cancer. Testing for HPV may also be done on women of any age with unclear Pap test results.  Other health care providers may not recommend any screening for nonpregnant women who are considered low risk for pelvic cancer and who do not have symptoms. Ask your health care provider if a screening pelvic exam is right for you.  If you have had past treatment for cervical cancer or a condition that could lead to cancer, you need Pap tests and screening for cancer for at least 20 years after your treatment. If Pap tests have been discontinued, your risk factors (such as having a new sexual partner) need to be reassessed to determine if screening should resume. Some women have medical problems that increase the chance of getting cervical cancer. In these cases, your health care provider may recommend more frequent screening and Pap tests. Colorectal Cancer  This type of cancer can be detected and often prevented.  Routine colorectal cancer screening usually begins at 60 years of age and continues through 60 years of age.  Your health care provider may recommend screening at an earlier age if you have risk factors for colon cancer.  Your health care provider may also recommend using home test  kits to check for hidden blood in the stool.  A small camera at the end of a tube can be used to examine your colon directly (sigmoidoscopy or colonoscopy). This is done to check for the earliest forms of colorectal cancer.  Routine screening usually begins at age 69.  Direct examination of the colon should be repeated every 5-10 years through 60 years of age. However, you may need to be screened more often if early forms of precancerous polyps or small growths are found. Skin Cancer  Check your skin from head to toe regularly.  Tell your health care provider about any new moles or changes in moles, especially if there is a change in a mole's shape or color.  Also tell your health care provider if you have a mole that is larger than the size of a pencil eraser.  Always use sunscreen. Apply sunscreen liberally and repeatedly throughout the day.  Protect yourself by wearing long sleeves, pants, a wide-brimmed hat, and sunglasses  whenever you are outside. Heart disease, diabetes, and high blood pressure  High blood pressure causes heart disease and increases the risk of stroke. High blood pressure is more likely to develop in: ? People who have blood pressure in the high end of the normal range (130-139/85-89 mm Hg). ? People who are overweight or obese. ? People who are African American.  If you are 57-23 years of age, have your blood pressure checked every 3-5 years. If you are 81 years of age or older, have your blood pressure checked every year. You should have your blood pressure measured twice-once when you are at a hospital or clinic, and once when you are not at a hospital or clinic. Record the average of the two measurements. To check your blood pressure when you are not at a hospital or clinic, you can use: ? An automated blood pressure machine at a pharmacy. ? A home blood pressure monitor.  If you are between 6 years and 64 years old, ask your health care provider if you should  take aspirin to prevent strokes.  Have regular diabetes screenings. This involves taking a blood sample to check your fasting blood sugar level. ? If you are at a normal weight and have a low risk for diabetes, have this test once every three years after 60 years of age. ? If you are overweight and have a high risk for diabetes, consider being tested at a younger age or more often. Preventing infection Hepatitis B  If you have a higher risk for hepatitis B, you should be screened for this virus. You are considered at high risk for hepatitis B if: ? You were born in a country where hepatitis B is common. Ask your health care provider which countries are considered high risk. ? Your parents were born in a high-risk country, and you have not been immunized against hepatitis B (hepatitis B vaccine). ? You have HIV or AIDS. ? You use needles to inject street drugs. ? You live with someone who has hepatitis B. ? You have had sex with someone who has hepatitis B. ? You get hemodialysis treatment. ? You take certain medicines for conditions, including cancer, organ transplantation, and autoimmune conditions. Hepatitis C  Blood testing is recommended for: ? Everyone born from [birth year] through 1967. ? Anyone with known risk factors for hepatitis C. Sexually transmitted infections (STIs)  You should be screened for sexually transmitted infections (STIs) including gonorrhea and chlamydia if: ? You are sexually active and are younger than 60 years of age. ? You are older than 60 years of age and your health care provider tells you that you are at risk for this type of infection. ? Your sexual activity has changed since you were last screened and you are at an increased risk for chlamydia or gonorrhea. Ask your health care provider if you are at risk.  If you do not have HIV, but are at risk, it may be recommended that you take a prescription medicine daily to prevent HIV infection. This is called  pre-exposure prophylaxis (PrEP). You are considered at risk if: ? You are sexually active and do not regularly use condoms or know the HIV status of your partner(s). ? You take drugs by injection. ? You are sexually active with a partner who has HIV. Talk with your health care provider about whether you are at high risk of being infected with HIV. If you choose to begin PrEP, you should first be tested for  HIV. You should then be tested every 3 months for as long as you are taking PrEP. Pregnancy  If you are premenopausal and you may become pregnant, ask your health care provider about preconception counseling.  If you may become pregnant, take 400 to 800 micrograms (mcg) of folic acid every day.  If you want to prevent pregnancy, talk to your health care provider about birth control (contraception). Osteoporosis and menopause  Osteoporosis is a disease in which the bones lose minerals and strength with aging. This can result in serious bone fractures. Your risk for osteoporosis can be identified using a bone density scan.  If you are 70 years of age or older, or if you are at risk for osteoporosis and fractures, ask your health care provider if you should be screened.  Ask your health care provider whether you should take a calcium or vitamin D supplement to lower your risk for osteoporosis.  Menopause may have certain physical symptoms and risks.  Hormone replacement therapy may reduce some of these symptoms and risks. Talk to your health care provider about whether hormone replacement therapy is right for you. Follow these instructions at home:  Schedule regular health, dental, and eye exams.  Stay current with your immunizations.  Do not use any tobacco products including cigarettes, chewing tobacco, or electronic cigarettes.  If you are pregnant, do not drink alcohol.  If you are breastfeeding, limit how much and how often you drink alcohol.  Limit alcohol intake to no more  than 1 drink per day for nonpregnant women. One drink equals 12 ounces of beer, 5 ounces of wine, or 1 ounces of hard liquor.  Do not use street drugs.  Do not share needles.  Ask your health care provider for help if you need support or information about quitting drugs.  Tell your health care provider if you often feel depressed.  Tell your health care provider if you have ever been abused or do not feel safe at home. This information is not intended to replace advice given to you by your health care provider. Make sure you discuss any questions you have with your health care provider. Document Released: 05/16/2011 Document Revised: 04/07/2016 Document Reviewed: 08/04/2015 Elsevier Interactive Patient Education  2019 [employer].

## 2019-01-17 ENCOUNTER — Other Ambulatory Visit: Payer: Self-pay

## 2019-01-17 DIAGNOSIS — E785 Hyperlipidemia, unspecified: Secondary | ICD-10-CM

## 2019-04-11 ENCOUNTER — Emergency Department (HOSPITAL_COMMUNITY): Payer: 59

## 2019-04-11 ENCOUNTER — Emergency Department (HOSPITAL_COMMUNITY)
Admission: EM | Admit: 2019-04-11 | Discharge: 2019-04-11 | Disposition: A | Discharge status: Home / Self Care | Payer: 59 | Attending: Emergency Medicine | Admitting: Emergency Medicine

## 2019-04-11 ENCOUNTER — Encounter (HOSPITAL_COMMUNITY): Payer: Self-pay | Admitting: Emergency Medicine

## 2019-04-11 ENCOUNTER — Other Ambulatory Visit: Payer: Self-pay

## 2019-04-11 DIAGNOSIS — I10 Essential (primary) hypertension: Secondary | ICD-10-CM | POA: Diagnosis not present

## 2019-04-11 DIAGNOSIS — R21 Rash and other nonspecific skin eruption: Secondary | ICD-10-CM | POA: Diagnosis not present

## 2019-04-11 DIAGNOSIS — R079 Chest pain, unspecified: Secondary | ICD-10-CM | POA: Diagnosis not present

## 2019-04-11 DIAGNOSIS — Z79899 Other long term (current) drug therapy: Secondary | ICD-10-CM | POA: Insufficient documentation

## 2019-04-11 DIAGNOSIS — R0789 Other chest pain: Secondary | ICD-10-CM | POA: Diagnosis not present

## 2019-04-11 LAB — BASIC METABOLIC PANEL
Anion gap: 10 (ref 5–15)
BUN: 11 mg/dL (ref 6–20)
CO2: 28 mmol/L (ref 22–32)
Calcium: 9.8 mg/dL (ref 8.9–10.3)
Chloride: 103 mmol/L (ref 98–111)
Creatinine, Ser: 0.66 mg/dL (ref 0.44–1.00)
GFR calc Af Amer: >60 mL/min (ref >60)
GFR calc non Af Amer: >60 mL/min (ref >60)
Glucose, Bld: 144 mg/dL — ABNORMAL HIGH (ref 70–99)
Potassium: 3.7 mmol/L (ref 3.5–5.1)
Sodium: 141 mmol/L (ref 135–145)

## 2019-04-11 LAB — CBC WITH DIFFERENTIAL/PLATELET
Abs Immature Granulocytes: 0 10*3/uL (ref 0.00–0.07)
Basophils Absolute: 0 10*3/uL (ref 0.0–0.1)
Basophils Relative: 1 %
Eosinophils Absolute: 0 10*3/uL (ref 0.0–0.5)
Eosinophils Relative: 1 %
HCT: 39.6 % (ref 36.0–46.0)
Hemoglobin: 12.8 g/dL (ref 12.0–15.0)
Immature Granulocytes: 0 %
Lymphocytes Relative: 48 %
Lymphs Abs: 1.7 10*3/uL (ref 0.7–4.0)
MCH: 27.6 pg (ref 26.0–34.0)
MCHC: 32.3 g/dL (ref 30.0–36.0)
MCV: 85.5 fL (ref 80.0–100.0)
Monocytes Absolute: 0.3 10*3/uL (ref 0.1–1.0)
Monocytes Relative: 7 %
Neutro Abs: 1.5 10*3/uL — ABNORMAL LOW (ref 1.7–7.7)
Neutrophils Relative %: 43 %
Platelets: 281 10*3/uL (ref 150–400)
RBC: 4.63 MIL/uL (ref 3.87–5.11)
RDW: 12.1 % (ref 11.5–15.5)
WBC: 3.5 10*3/uL — ABNORMAL LOW (ref 4.0–10.5)
nRBC: 0 % (ref 0.0–0.2)

## 2019-04-11 LAB — D-DIMER, QUANTITATIVE: D-Dimer, Quant: <0.27 ug/mL-FEU (ref 0.00–0.50)

## 2019-04-11 LAB — TROPONIN I: Troponin I: <0.03 ng/mL (ref <0.03)

## 2019-04-11 MED ORDER — CEPHALEXIN 500 MG PO CAPS: 500.0000 mg | ORAL_CAPSULE | Freq: Three times a day (TID) | ORAL | 0 refills | Status: DC

## 2019-04-11 MED ORDER — ACETAMINOPHEN 500 MG PO TABS
1000.0000 mg | ORAL_TABLET | Freq: Once | ORAL | Status: AC
  Administered 2019-04-11: 1000 mg via ORAL
  Filled 2019-04-11: qty 2

## 2019-04-11 MED FILL — CEPHALEXIN 500 MG CAPSULE: 500 | 7 days supply | Qty: 21 | Fill #0

## 2019-04-11 NOTE — ED Triage Notes (Signed)
Patient arrives POV c/o chest pressure to right breast on Tuesday. Patient states first she noticed a bug bite to her right breast and some tingling to it. Patient states pain has radiated into her shoulder blade. No shortness of breathe.

## 2019-04-11 NOTE — Discharge Instructions (Signed)
If you notice spreading redness, fevers start taking Keflex antibiotic. Follow-up closely with your primary doctor. Return to the emergency room for worsening chest pain, shortness of breath or new concerns.

## 2019-04-11 NOTE — ED Provider Notes (Signed)
[facility] EMERGENCY DEPARTMENT Provider Note   CSN: [id number] Arrival date & time: 04/11/19  4259    History   Chief Complaint Chief Complaint  Patient presents with  . Chest Pain    HPI Sharon Wells is a 58 y.o. female.     Patient presents with right-sided chest pain and mild radiation to the back.  Patient is never had this before.  Patient did have a bug bite-like experience on her right lateral breast that may be related.  No fevers or chills.  No history of skin infections.  Patient denies blood clot risk factors.  Patient had high blood pressure in the past and obesity however has had weight loss and not requiring medications.  Patient is healthy overall.  No cardiac history.  Pain more of a pressure.     Past Medical History:  Diagnosis Date  . Chicken pox   . Fibroids    With history of anemia  . Hyperlipidemia     Patient Active Problem List   Diagnosis Date Noted  . Essential hypertension 05/02/2017  . Adverse reaction to vaccine 09/03/2014  . Elevated blood pressure 09/03/2014  . Other specified anemias 07/16/2014  . Preventive measure 07/16/2014    Past Surgical History:  Procedure Laterality Date  . COLONOSCOPY  2013   In [place]  . NO PAST SURGERIES       OB History    Gravida  3   Para  3   Term  3   Preterm      AB      Living  3     SAB      TAB      Ectopic      Multiple      Live Births               Home Medications    Prior to Admission medications   Medication Sig Start Date End Date Taking? Authorizing Provider  cyanocobalamin 2000 MCG tablet Take 2,000 mcg by mouth daily.   Yes [provider]  NON FORMULARY daily. [employer]: Multivitamin Complex with TRE-EN-EN Grain Concentrates    Yes [provider]  Omega-3 Fatty Acids (FISH OIL) 1000 MG CAPS Take 1,000 mg by mouth daily.    Yes [provider]  cephALEXin (KEFLEX) 500 MG capsule Take 1 capsule (500 mg  total) by mouth 3 (three) times daily. 04/11/19   Elnora Morrison, MD    Family History Family History  Problem Relation Age of Onset  . Sarcoidosis Mother        Deceased in her 55s  . Hypertension Sister   . Diabetes Sister   . Sarcoidosis Sister   . COPD Sister        Emphysema  . Pulmonary embolism Sister        Apparently am provoked  . Heart disease Maternal Grandmother   . Breast cancer Neg Hx   . Colon cancer Neg Hx   . Esophageal cancer Neg Hx   . Stomach cancer Neg Hx   . Rectal cancer Neg Hx     Social History Social History   Tobacco Use  . Smoking status: Never Smoker  . Smokeless tobacco: Never Used  Substance Use Topics  . Alcohol use: No  . Drug use: No     Allergies   Eggs or egg-derived products   Review of Systems Review of Systems  Constitutional: Positive for appetite change. Negative for  chills and fever.  HENT: Negative for congestion.   Eyes: Negative for visual disturbance.  Respiratory: Negative for shortness of breath.   Cardiovascular: Positive for chest pain.  Gastrointestinal: Negative for abdominal pain and vomiting.  Genitourinary: Negative for dysuria and flank pain.  Musculoskeletal: Negative for back pain, neck pain and neck stiffness.  Skin: Positive for rash.  Neurological: Negative for light-headedness and headaches.     Physical Exam Updated Vital Signs BP 131/86   Pulse 76   Temp 98.5 F (36.9 C) (Oral)   Resp 17   Ht 5\' 4"  (1.626 m)   Wt 66.7 kg   LMP 12/15/2013   SpO2 99%   BMI 25.23 kg/m   Physical Exam Vitals signs and nursing note reviewed.  Constitutional:      Appearance: She is well-developed.  HENT:     Head: Normocephalic and atraumatic.  Eyes:     General:        Right eye: No discharge.        Left eye: No discharge.     Conjunctiva/sclera: Conjunctivae normal.  Neck:     Musculoskeletal: Normal range of motion and neck supple.     Trachea: No tracheal deviation.  Cardiovascular:      Rate and Rhythm: Regular rhythm. Tachycardia present.  Pulmonary:     Effort: Pulmonary effort is normal.     Breath sounds: Normal breath sounds.  Abdominal:     General: There is no distension.     Palpations: Abdomen is soft.     Tenderness: There is no abdominal tenderness. There is no guarding.  Skin:    General: Skin is warm.     Findings: Rash present.     Comments: Patient has approximately 5 or 6 papules with mild erythema right upper lateral breast area.  No surrounding erythema or induration.  No significant lymphadenopathy in axilla.  Neurological:     Mental Status: She is alert and oriented to person, place, and time.      ED Treatments / Results  Labs (all labs ordered are listed, but only abnormal results are displayed) Labs Reviewed  CBC WITH DIFFERENTIAL/PLATELET - Abnormal; Notable for the following components:      Result Value   WBC 3.5 (*)    Neutro Abs 1.5 (*)    All other components within normal limits  BASIC METABOLIC PANEL - Abnormal; Notable for the following components:   Glucose, Bld 144 (*)    All other components within normal limits  TROPONIN I  D-DIMER, QUANTITATIVE (NOT AT [facility])    EKG EKG Interpretation  Date/Time:  Thursday Apr 11 2019 08:51:58 EDT Ventricular Rate:  110 PR Interval:    QRS Duration: 87 QT Interval:  315 QTC Calculation: 427 R Axis:   62 Text Interpretation:  Sinus tachycardia Borderline prolonged PR interval LAE, consider biatrial enlargement Minimal ST depression, anterolateral leads Confirmed by Elnora Morrison [phone]) on 04/11/2019 8:56:16 AM   Radiology Dg Chest Portable 1 View  Result Date: 04/11/2019 CLINICAL DATA:  Chest pain. EXAM: PORTABLE CHEST 1 VIEW COMPARISON:  None. FINDINGS: The heart size and mediastinal contours are within normal limits. Both lungs are clear. The visualized skeletal structures are unremarkable. IMPRESSION: No active disease. Electronically Signed   By: Titus Dubin M.D.   On:  04/11/2019 09:54    Procedures Procedures (including critical care time)  Medications Ordered in ED Medications  acetaminophen (TYLENOL) tablet 1,000 mg (1,000 mg Oral Given 04/11/19 0932)  Initial Impression / Assessment and Plan / ED Course  I have reviewed the triage vital signs and the nursing notes.  Pertinent labs & imaging results that were available during my care of the patient were reviewed by me and considered in my medical decision making (see chart for details).       Patient presents with rash, chest pain with mild radiation.  Discussed this is likely from the rash with mild discomfort/possible early infection cellulitis/ shingles to explain her heart rate elevated however her heart rate in the room is 115 and without a fever I feel this is out of proportion.  With her age and few risk factors discussed plan for screening for cardiac and blood clot.  D-dimer and troponin sent, Tylenol given for pain.  Blood work sent.  Chest x-ray portable. D-dimer negative, troponin negative, blood work reviewed unremarkable.  Patient's heart rate normalized.  Chest x-ray no acute findings.  Patient stable for outpatient follow-up with her primary doctor and Keflex for possible early cellulitis if worsening signs.  Results and differential diagnosis were discussed with the patient/parent/guardian. Xrays were independently reviewed by myself.  Close follow up outpatient was discussed, comfortable with the plan.   Medications  acetaminophen (TYLENOL) tablet 1,000 mg (1,000 mg Oral Given 04/11/19 0932)    Vitals:   04/11/19 0930 04/11/19 1000 04/11/19 1030 04/11/19 1100  BP: (!) 161/90 140/90 128/81 131/86  Pulse: (!) 102 83 79 76  Resp: (!) 21 12 19 17   Temp:      TempSrc:      SpO2: 99% 98% 98% 99%  Weight:      Height:        Final diagnoses:  Right-sided chest pain  Skin rash  Hypertension, unspecified type      Final Clinical Impressions(s) / ED Diagnoses   Final  diagnoses:  Right-sided chest pain  Skin rash  Hypertension, unspecified type    ED Discharge Orders         Ordered    cephALEXin (KEFLEX) 500 MG capsule  3 times daily     04/11/19 1244           Elnora Morrison, MD 04/11/19 1245

## 2019-04-15 ENCOUNTER — Ambulatory Visit (INDEPENDENT_AMBULATORY_CARE_PROVIDER_SITE_OTHER): Payer: 59 | Admitting: Family Medicine

## 2019-04-15 ENCOUNTER — Other Ambulatory Visit: Payer: Self-pay

## 2019-04-15 ENCOUNTER — Encounter: Payer: Self-pay | Admitting: Family Medicine

## 2019-04-15 VITALS — BP 148/82 | HR 83 | Temp 98.6°F | Ht 63.0 in | Wt 149.8 lb

## 2019-04-15 DIAGNOSIS — B029 Zoster without complications: Secondary | ICD-10-CM | POA: Diagnosis not present

## 2019-04-15 NOTE — Progress Notes (Signed)
  Subjective:     Patient ID: Sharon Wells, female   DOB: [birth year], 58 y.o.   MRN: [id number]  HPI  Seen with painful blistery rash in her right lateral breast and axillary region.  She first noticed some tingling and pain on Monday.  But Tuesday she initially thought this may have been a bug bite.  She had this looked at by someone and diagnosis was uncertain.  She took some Benadryl.  By Thursday she had some worsening of rash and went to the emergency room.  She was placed on Keflex for possible cellulitis.  She has had some progressive slightly raised vesicular lesions since then.  No fever.  No chills.  Her pain is 6 out of 10 at its worst.  Using Tylenol with good benefit  No history of shingles vaccine  Past Medical History:  Diagnosis Date  . Chicken pox   . Fibroids    With history of anemia  . Hyperlipidemia    Past Surgical History:  Procedure Laterality Date  . COLONOSCOPY  2013   In [place]  . NO PAST SURGERIES      reports that she has never smoked. She has never used smokeless tobacco. She reports that she does not drink alcohol or use drugs. family history includes COPD in her sister; Diabetes in her sister; Heart disease in her maternal grandmother; Hypertension in her sister; Pulmonary embolism in her sister; Sarcoidosis in her mother and sister. Allergies  Allergen Reactions  . Eggs Or Egg-Derived Products Nausea And Vomiting    Had  Ed visit after flu vaccine and seen bu allergist      Review of Systems  Constitutional: Negative for chills and fever.  Cardiovascular: Negative for chest pain.  Skin: Positive for rash.       Objective:   Physical Exam Constitutional:      Appearance: Normal appearance.  Skin:    Comments: Patient has a couple of clusters of rash right lateral breast toward the axillary region.  These are small vesicles in clusters.  No pustules  Neurological:     Mental Status: She is alert.        Assessment:     Shingles  involving right lateral breast region.  Her pain has been fairly well controlled with Tylenol.    Plan:     -We did not see any clear benefit of Valtrex since she is about a week into this -We discussed possible use of gabapentin if necessary for pain but at this point her symptoms are controlled with Tylenol -Keep clean with soap and water -Follow-up as needed  Eulas Post MD [facility]

## 2019-04-15 NOTE — Patient Instructions (Signed)
Shingles    Shingles, which is also known as herpes zoster, is an infection that causes a painful skin rash and fluid-filled blisters. It is caused by a virus.  Shingles only develops in people who:   Have had chickenpox.   Have been given a medicine to protect against chickenpox (have been vaccinated). Shingles is rare in this group.  What are the causes?  Shingles is caused by varicella-zoster virus (VZV). This is the same virus that causes chickenpox. After a person is exposed to VZV, the virus stays in the body in an inactive (dormant) state. Shingles develops if the virus is reactivated. This can happen many years after the first (initial) exposure to VZV. It is not known what causes this virus to be reactivated.  What increases the risk?  People who have had chickenpox or received the chickenpox vaccine are at risk for shingles. Shingles infection is more common in people who:   Are older than age 60.   Have a weakened disease-fighting system (immune system), such as people with:  ? HIV.  ? AIDS.  ? Cancer.   Are taking medicines that weaken the immune system, such as transplant medicines.   Are experiencing a lot of stress.  What are the signs or symptoms?  Early symptoms of this condition include itching, tingling, and pain in an area on your skin. Pain may be described as burning, stabbing, or throbbing.  A few days or weeks after early symptoms start, a painful red rash appears. The rash is usually on one side of the body and has a band-like or belt-like pattern. The rash eventually turns into fluid-filled blisters that break open, change into scabs, and dry up in about 2-3 weeks.  At any time during the infection, you may also develop:   A fever.   Chills.   A headache.   An upset stomach.  How is this diagnosed?  This condition is diagnosed with a skin exam. Skin or fluid samples may be taken from the blisters before a diagnosis is made. These samples are examined under a microscope or sent to  a lab for testing.  How is this treated?  The rash may last for several weeks. There is not a specific cure for this condition. Your health care provider will probably prescribe medicines to help you manage pain, recover more quickly, and avoid long-term problems. Medicines may include:   Antiviral drugs.   Anti-inflammatory drugs.   Pain medicines.   Anti-itching medicines (antihistamines).  If the area involved is on your face, you may be referred to a specialist, such as an eye doctor (ophthalmologist) or an ear, nose, and throat (ENT) doctor (otolaryngologist) to help you avoid eye problems, chronic pain, or disability.  Follow these instructions at home:  Medicines   Take over-the-counter and prescription medicines only as told by your health care provider.   Apply an anti-itch cream or numbing cream to the affected area as told by your health care provider.  Relieving itching and discomfort     Apply cold, wet cloths (cold compresses) to the area of the rash or blisters as told by your health care provider.   Cool baths can be soothing. Try adding baking soda or dry oatmeal to the water to reduce itching. Do not bathe in hot water.  Blister and rash care   Keep your rash covered with a loose bandage (dressing). Wear loose-fitting clothing to help ease the pain of material rubbing against the rash.     Keep your rash and blisters clean by washing the area with mild soap and cool water as told by your health care provider.   Check your rash every day for signs of infection. Check for:  ? More redness, swelling, or pain.  ? Fluid or blood.  ? Warmth.  ? Pus or a bad smell.   Do not scratch your rash or pick at your blisters. To help avoid scratching:  ? Keep your fingernails clean and cut short.  ? Wear gloves or mittens while you sleep, if scratching is a problem.  General instructions   Rest as told by your health care provider.   Keep all follow-up visits as told by your health care provider. This  is important.   Wash your hands often with soap and water. If soap and water are not available, use hand sanitizer. Doing this lowers your chance of getting a bacterial skin infection.   Before your blisters change into scabs, your shingles infection can cause chickenpox in people who have never had it or have never been vaccinated against it. To prevent this from happening, avoid contact with other people, especially:  ? Babies.  ? Pregnant women.  ? Children who have eczema.  ? Elderly people who have transplants.  ? People who have chronic illnesses, such as cancer or AIDS.  Contact a health care provider if:   Your pain is not relieved with prescribed medicines.   Your pain does not get better after the rash heals.   You have signs of infection in the rash area, such as:  ? More redness, swelling, or pain around the rash.  ? Fluid or blood coming from the rash.  ? The rash area feeling warm to the touch.  ? Pus or a bad smell coming from the rash.  Get help right away if:   The rash is on your face or nose.   You have facial pain, pain around your eye area, or loss of feeling on one side of your face.   You have difficulty seeing.   You have ear pain or have ringing in your ear.   You have a loss of taste.   Your condition gets worse.  Summary   Shingles, which is also known as herpes zoster, is an infection that causes a painful skin rash and fluid-filled blisters.   This condition is diagnosed with a skin exam. Skin or fluid samples may be taken from the blisters and examined before the diagnosis is made.   Keep your rash covered with a loose bandage (dressing). Wear loose-fitting clothing to help ease the pain of material rubbing against the rash.   Before your blisters change into scabs, your shingles infection can cause chickenpox in people who have never had it or have never been vaccinated against it.  This information is not intended to replace advice given to you by your health care  provider. Make sure you discuss any questions you have with your health care provider.  Document Released: 10/31/2005 Document Revised: 07/05/2017 Document Reviewed: 07/05/2017  [employer]vier Interactive Patient Education  2019 [employer]

## 2019-07-05 ENCOUNTER — Other Ambulatory Visit: Payer: Self-pay

## 2019-07-05 ENCOUNTER — Ambulatory Visit: Payer: 59 | Admitting: Podiatry

## 2019-07-05 ENCOUNTER — Encounter: Payer: Self-pay | Admitting: Podiatry

## 2019-07-05 ENCOUNTER — Ambulatory Visit: Payer: 59

## 2019-07-05 VITALS — Temp 97.7°F

## 2019-07-05 DIAGNOSIS — M2041 Other hammer toe(s) (acquired), right foot: Secondary | ICD-10-CM | POA: Diagnosis not present

## 2019-07-05 DIAGNOSIS — L84 Corns and callosities: Secondary | ICD-10-CM

## 2019-07-05 NOTE — Addendum Note (Signed)
Addended by: Celene Skeen A on: 07/05/2019 03:47 PM   Modules accepted: Orders

## 2019-07-05 NOTE — Progress Notes (Signed)
Subjective:   Patient ID: Sharon Wells, female   DOB: 58 y.o.   MRN: [id number]   HPI Patient presents with keratotic lesion fifth digit bilateral that is painful   ROS      Objective:  Physical Exam  Hammertoe deformity with chronic lesion fifth digit bilateral     Assessment:  Pain to palpation head of proximal phalanx with lesion fifth digit bilateral     Plan:  Sterile debridement of tissue no iatrogenic bleeding and reappoint for routine care

## 2019-08-13 ENCOUNTER — Encounter: Payer: Self-pay | Admitting: Gynecology

## 2019-08-27 ENCOUNTER — Other Ambulatory Visit: Payer: Self-pay

## 2019-08-28 ENCOUNTER — Other Ambulatory Visit: Payer: Self-pay | Admitting: Obstetrics & Gynecology

## 2019-08-28 ENCOUNTER — Encounter: Payer: Self-pay | Admitting: Obstetrics & Gynecology

## 2019-08-28 ENCOUNTER — Ambulatory Visit (INDEPENDENT_AMBULATORY_CARE_PROVIDER_SITE_OTHER): Payer: 59 | Admitting: Obstetrics & Gynecology

## 2019-08-28 VITALS — BP 132/80 | Ht 63.0 in | Wt 142.0 lb

## 2019-08-28 DIAGNOSIS — Z780 Asymptomatic menopausal state: Secondary | ICD-10-CM

## 2019-08-28 DIAGNOSIS — Z1231 Encounter for screening mammogram for malignant neoplasm of breast: Secondary | ICD-10-CM

## 2019-08-28 DIAGNOSIS — Z01419 Encounter for gynecological examination (general) (routine) without abnormal findings: Secondary | ICD-10-CM

## 2019-08-28 NOTE — Patient Instructions (Signed)
1. Well female exam with routine gynecological exam Normal gynecologic exam in menopause.  Pap test October 2019 was negative, no indication to repeat a Pap test this year.  Breast exam normal.  We will schedule a repeat screening mammogram in November 2020.  Body mass index at 25.15.  Low calorie diet for weight loss.  Continue with fitness.  Colonoscopy 2019, a benign polyp was excised.  Hypercholesterolemia on a low-cholesterol diet and followed by Dr. Regis Bill.  2. Post-menopausal Well on no hormone replacement therapy.  No postmenopausal bleeding.  Last bone density February 2019 was normal, will repeat at 5 years.  Recommend vitamin D supplements, calcium intake of 1200 mg daily and regular weightbearing physical activities.  Floraine, it was a pleasure seeing you today!

## 2019-08-28 NOTE — Progress Notes (Signed)
Sharon Wells November 11, 1961 [id number]   History:    59 y.o. G3P3L3 Divorced.  Has grand-children.  RP:  Established patient presenting for annual gyn exam   HPI: Postmenopausal, well on no hormone replacement therapy.  No postmenopausal bleeding.  No pelvic pain.  Abstinent.  Urine and bowel movements normal.  Breasts normal.  Body mass index 25.15.  Good fitness.  Decreasing calories for weight loss and on the low cholesterol diet.  LDL and triglycerides were increased in February 2020.  The total cholesterol over HDL was at 5.   Following hypercholesterolemia with Dr. Regis Bill.  Past medical history,surgical history, family history and social history were all reviewed and documented in the EPIC chart.  Gynecologic History Patient's last menstrual period was 12/15/2013. Contraception: abstinence and post menopausal status Last Pap: 08/2018. Results were: Negative Last mammogram: 09/2018. Results were: Negative Bone Density: 12/2017 Normal Colonoscopy: 09/2018 Benign polyp  Obstetric History OB History  Gravida Para Term Preterm AB Living  3 3 3     3   SAB TAB Ectopic Multiple Live Births               # Outcome Date GA Lbr Len/2nd Weight Sex Delivery Anes PTL Lv  3 Term           2 Term           1 Term              ROS: A ROS was performed and pertinent positives and negatives are included in the history.  GENERAL: No fevers or chills. HEENT: No change in vision, no earache, sore throat or sinus congestion. NECK: No pain or stiffness. CARDIOVASCULAR: No chest pain or pressure. No palpitations. PULMONARY: No shortness of breath, cough or wheeze. GASTROINTESTINAL: No abdominal pain, nausea, vomiting or diarrhea, melena or bright red blood per rectum. GENITOURINARY: No urinary frequency, urgency, hesitancy or dysuria. MUSCULOSKELETAL: No joint or muscle pain, no back pain, no recent trauma. DERMATOLOGIC: No rash, no itching, no lesions. ENDOCRINE: No polyuria, polydipsia, no heat or  cold intolerance. No recent change in weight. HEMATOLOGICAL: No anemia or easy bruising or bleeding. NEUROLOGIC: No headache, seizures, numbness, tingling or weakness. PSYCHIATRIC: No depression, no loss of interest in normal activity or change in sleep pattern.     Exam:   BP 132/80 (BP Location: Right Arm, Patient Position: Sitting, Cuff Size: Normal)   Ht 5\' 3"  (1.6 m)   Wt 142 lb (64.4 kg)   LMP 12/15/2013   BMI 25.15 kg/m   Body mass index is 25.15 kg/m.  General appearance : Well developed well nourished female. No acute distress HEENT: Eyes: no retinal hemorrhage or exudates,  Neck supple, trachea midline, no carotid bruits, no thyroidmegaly Lungs: Clear to auscultation, no rhonchi or wheezes, or rib retractions  Heart: Regular rate and rhythm, no murmurs or gallops Breast:Examined in sitting and supine position were symmetrical in appearance, no palpable masses or tenderness,  no skin retraction, no nipple inversion, no nipple discharge, no skin discoloration, no axillary or supraclavicular lymphadenopathy Abdomen: no palpable masses or tenderness, no rebound or guarding Extremities: no edema or skin discoloration or tenderness  Pelvic: Vulva: Normal             Vagina: No gross lesions or discharge  Cervix: No gross lesions or discharge  Uterus  AV, normal size, shape and consistency, non-tender and mobile  Adnexa  Without masses or tenderness  Anus: Non thrombosed hemorrhoids  Assessment/Plan:  58 y.o. female for annual exam   1. Well female exam with routine gynecological exam Normal gynecologic exam in menopause.  Pap test October 2019 was negative, no indication to repeat a Pap test this year.  Breast exam normal.  We will schedule a repeat screening mammogram in November 2020.  Body mass index at 25.15.  Low calorie diet for weight loss.  Continue with fitness.  Colonoscopy 2019, a benign polyp was excised.  Hypercholesterolemia on a low-cholesterol diet and followed  by Dr. Regis Bill.  2. Post-menopausal Well on no hormone replacement therapy.  No postmenopausal bleeding.  Last bone density February 2019 was normal, will repeat at 5 years.  Recommend vitamin D supplements, calcium intake of 1200 mg daily and regular weightbearing physical activities.  Princess Bruins MD, 8:12 AM 08/28/2019

## 2019-10-16 ENCOUNTER — Ambulatory Visit
Admission: RE | Admit: 2019-10-16 | Discharge: 2019-10-16 | Disposition: A | Discharge status: Home / Self Care | Payer: 59 | Source: Ambulatory Visit | Attending: Obstetrics & Gynecology | Admitting: Obstetrics & Gynecology

## 2019-10-16 ENCOUNTER — Other Ambulatory Visit: Payer: Self-pay

## 2019-10-16 DIAGNOSIS — Z1231 Encounter for screening mammogram for malignant neoplasm of breast: Secondary | ICD-10-CM | POA: Diagnosis not present

## 2019-10-18 DIAGNOSIS — H5213 Myopia, bilateral: Secondary | ICD-10-CM | POA: Diagnosis not present

## 2020-02-03 ENCOUNTER — Other Ambulatory Visit: Payer: Self-pay

## 2020-02-03 ENCOUNTER — Encounter: Payer: Self-pay | Admitting: Podiatry

## 2020-02-03 ENCOUNTER — Ambulatory Visit (INDEPENDENT_AMBULATORY_CARE_PROVIDER_SITE_OTHER): Payer: 59 | Admitting: Podiatry

## 2020-02-03 VITALS — Temp 96.2°F

## 2020-02-03 DIAGNOSIS — L84 Corns and callosities: Secondary | ICD-10-CM

## 2020-02-04 NOTE — Progress Notes (Signed)
Subjective:   Patient ID: Sharon Wells, female   DOB: 59 y.o.   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

## 2020-02-07 ENCOUNTER — Ambulatory Visit: Payer: 59 | Admitting: Internal Medicine

## 2020-03-02 ENCOUNTER — Other Ambulatory Visit: Payer: Self-pay

## 2020-03-02 NOTE — Progress Notes (Signed)
This visit occurred during the SARS-CoV-2 public health emergency.  Safety protocols were in place, including screening questions prior to the visit, additional usage of staff PPE, and extensive cleaning of exam room while observing appropriate contact time as indicated for disinfecting solutions.    Chief Complaint  Patient presents with  . Annual Exam    Doing well    HPI: Patient  Sharon Wells  59 y.o. comes in today for [facility] visit  No  Health concerns  Bp has been 126/ 76  With LSI  Waiting on getting covid vaccine for now  Had episode of shingles  Last June  Health Maintenance  Topic Date Due  . Hepatitis C Screening  Never done  . HIV Screening  Never done  . INFLUENZA VACCINE  09/01/2049 (Originally 06/14/2020)  . PAP SMEAR-Modifier  08/23/2021  . MAMMOGRAM  10/15/2021  . TETANUS/TDAP  05/08/2023  . COLONOSCOPY  10/04/2023   Health Maintenance Review LIFESTYLE:  Exercise:   Walking  Tobacco/ETS: no Alcohol: no Sugar beverages:cuts back  Sleep:  Ave  5-6 hours  Drug use: no HH of  1 no pets  Work: about 50 hours .     ROS:  GEN/ HEENT: No fever, significant weight changes sweats headaches vision problems hearing changes, CV/ PULM; No chest pain shortness of breath cough, syncope,edema  change in exercise tolerance. GI /GU: No adominal pain, vomiting, change in bowel habits. No blood in the stool. No significant GU symptoms. SKIN/HEME: ,no acute skin rashes suspicious lesions or bleeding. No lymphadenopathy, nodules, masses.  NEURO/ PSYCH:  No neurologic signs such as weakness numbness. No depression anxiety. IMM/ Allergy: No unusual infections.  Allergy .   REST of 12 system review negative except as per HPI   Past Medical History:  Diagnosis Date  . Chicken pox   . Fibroids    With history of anemia  . Hyperlipidemia     Past Surgical History:  Procedure Laterality Date  . COLONOSCOPY  2013   In [place]  . NO PAST SURGERIES        Family History  Problem Relation Age of Onset  . Sarcoidosis Mother        Deceased in her 107s  . Hypertension Sister   . Diabetes Sister   . Sarcoidosis Sister   . COPD Sister        Emphysema  . Pulmonary embolism Sister        Apparently am provoked  . Heart disease Maternal Grandmother   . Breast cancer Neg Hx   . Colon cancer Neg Hx   . Esophageal cancer Neg Hx   . Stomach cancer Neg Hx   . Rectal cancer Neg Hx     Social History   Socioeconomic History  . Marital status: Divorced    Spouse name: Not on file  . Number of children: Not on file  . Years of education: Not on file  . Highest education level: Not on file  Occupational History  . Not on file  Tobacco Use  . Smoking status: Never Smoker  . Smokeless tobacco: Never Used  Substance and Sexual Activity  . Alcohol use: No  . Drug use: No  . Sexual activity: Not Currently    Comment: 1ST intercourse- 15, partners- 2,   Other Topics Concern  . 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   Social Determinants of Health   Financial Resource Strain:   . Difficulty of Paying Living Expenses:   Food Insecurity:   . Worried About Charity fundraiser in the Last Year:   . Arboriculturist in the Last Year:   Transportation Needs:   . Film/video editor (Medical):   Marland Kitchen Lack of Transportation (Non-Medical):   Physical Activity:   . Days of Exercise per Week:   . Minutes of Exercise per Session:   Stress:   . Feeling of Stress :   Social Connections:   . Frequency of Communication with Friends and Family:   . Frequency of Social Gatherings with Friends and Family:   . Attends Religious Services:    . Active Member of Clubs or Organizations:   . Attends Archivist Meetings:   Marland Kitchen Marital Status:     Outpatient Medications Prior to Visit  Medication Sig Dispense Refill  . cyanocobalamin 2000 MCG tablet Take 2,000 mcg by mouth daily.    . NON FORMULARY daily. [employer]: Multivitamin Complex with TRE-EN-EN Grain Concentrates     . Omega-3 Fatty Acids (FISH OIL) 1000 MG CAPS Take 1,000 mg by mouth daily.      No facility-administered medications prior to visit.     EXAM:  BP 136/82   Pulse (!) 59   Temp 98.2 F (36.8 C) (Temporal)   Ht 5\' 3"  (1.6 m)   Wt 143 lb 9.6 oz (65.1 kg)   LMP 12/15/2013   SpO2 97%   BMI 25.44 kg/m   Body mass index is 25.44 kg/m. Wt Readings from Last 3 Encounters:  03/03/20 143 lb 9.6 oz (65.1 kg)  08/28/19 142 lb (64.4 kg)  04/15/19 149 lb 12.8 oz (67.9 kg)    Physical Exam: Vital signs reviewed [id number] is a well-developed well-nourished alert cooperative    who appearsr stated age in no acute distress.  HEENT: normocephalic atraumatic , Eyes: PERRL EOM's full, conjunctiva clear,., Ears: no deformity EAC's clear TMs with normal landmarks. Mouth: masked  NECK: supple without masses, thyromegaly or bruits. CHEST/PULM:  Clear to auscultation and percussion breath sounds equal no wheeze , rales or rhonchi. No chest wall deformities or tenderness. Breast: normal by inspection . No dimpling, discharge, masses, tenderness or discharge . CV: PMI is nondisplaced, S1 S2 no gallops, murmurs, rubs. Peripheral pulses are full without delay.No JVD .  ABDOMEN: Bowel sounds normal nontender  No guard or rebound, no hepato splenomegal no CVA tenderness.   Extremtities:  No clubbing cyanosis or edema, no acute joint swelling or redness no focal atrophy NEURO:  Oriented x3, cranial nerves 3-12 appear to be intact, no obvious focal weakness,gait within normal limits no abnormal reflexes or asymmetrical SKIN: No acute rashes normal turgor, color, no  bruising or petechiae. PSYCH: Oriented, good eye contact, no obvious depression anxiety, cognition and judgment appear normal. LN: no cervical axillary inguinal adenopathy  Lab Results  Component Value Date   WBC 3.5 (L) 04/11/2019   HGB 12.8 04/11/2019   HCT 39.6 04/11/2019   PLT 281 04/11/2019   GLUCOSE 144 (H) 04/11/2019   CHOL 254 (H) 01/14/2019   TRIG  172.0 (H) 01/14/2019   HDL 50.30 01/14/2019   LDLCALC 170 (H) 01/14/2019   ALT 12 01/14/2019   AST 15 01/14/2019   NA 141 04/11/2019   K 3.7 04/11/2019   CL 103 04/11/2019   CREATININE 0.66 04/11/2019   BUN 11 04/11/2019   CO2 28 04/11/2019   TSH 1.04 01/14/2019   HGBA1C 5.8 01/14/2019    BP Readings from Last 3 Encounters:  03/03/20 136/82  08/28/19 132/80  04/15/19 (!) 148/82    Fasting   ASSESSMENT AND PLAN:  Discussed the following assessment and plan:    ICD-10-CM   1. Visit for preventive health examination  123456 Basic metabolic panel    CBC with Differential/Platelet    Hepatic function panel    Lipid panel    TSH    Hemoglobin A1c  2. Hyperglycemia  R73.9 Hemoglobin A1c  Bp controlled  With lsi weight management   Fasting labs to day    Disc risk benefit covid vaccines  Return in about 1 year (around 03/03/2021) for cpx with labs.  Patient Care Team: Dez Stauffer, Standley Brooking, MD as PCP - General (Internal Medicine) Princess Bruins, MD as Consulting Physician (Obstetrics and Gynecology) Patient Instructions  Continue lifestyle intervention healthy eating and exercise .     Will notify you  of labs when available.    Health Maintenance, Female Adopting a healthy lifestyle and getting preventive care are important in promoting health and wellness. Ask your health care provider about:  The right schedule for you to have regular tests and exams.  Things you can do on your own to prevent diseases and keep yourself healthy. What should I know about diet, weight, and exercise? Eat a healthy  diet   Eat a diet that includes plenty of vegetables, fruits, low-fat dairy products, and lean protein.  Do not eat a lot of foods that are high in solid fats, added sugars, or sodium. Maintain a healthy weight Body mass index (BMI) is used to identify weight problems. It estimates body fat based on height and weight. Your health care provider can help determine your BMI and help you achieve or maintain a healthy weight. Get regular exercise Get regular exercise. This is one of the most important things you can do for your health. Most adults should:  Exercise for at least 150 minutes each week. The exercise should increase your heart rate and make you sweat (moderate-intensity exercise).  Do strengthening exercises at least twice a week. This is in addition to the moderate-intensity exercise.  Spend less time sitting. Even light physical activity can be beneficial. Watch cholesterol and blood lipids Have your blood tested for lipids and cholesterol at 61 years of age, then have this test every 5 years. Have your cholesterol levels checked more often if:  Your lipid or cholesterol levels are high.  You are older than 61 years of age.  You are at high risk for heart disease. What should I know about cancer screening? Depending on your health history and family history, you may need to have cancer screening at various ages. This may include screening for:  Breast cancer.  Cervical cancer.  Colorectal cancer.  Skin cancer.  Lung cancer. What should I know about heart disease, diabetes, and high blood pressure? Blood pressure and heart disease  High blood pressure causes heart disease and increases the risk of stroke. This is more likely to develop in people who have high blood pressure readings, are of African descent, or  are overweight.  Have your blood pressure checked: ? Every 3-5 years if you are 67-57 years of age. ? Every year if you are 2 years old or  older. Diabetes Have regular diabetes screenings. This checks your fasting blood sugar level. Have the screening done:  Once every three years after age 22 if you are at a normal weight and have a low risk for diabetes.  More often and at a younger age if you are overweight or have a high risk for diabetes. What should I know about preventing infection? Hepatitis B If you have a higher risk for hepatitis B, you should be screened for this virus. Talk with your health care provider to find out if you are at risk for hepatitis B infection. Hepatitis C Testing is recommended for:  Everyone born from [birth year] through 1965.  Anyone with known risk factors for hepatitis C. Sexually transmitted infections (STIs)  Get screened for STIs, including gonorrhea and chlamydia, if: ? You are sexually active and are younger than 61 years of age. ? You are older than 61 years of age and your health care provider tells you that you are at risk for this type of infection. ? Your sexual activity has changed since you were last screened, and you are at increased risk for chlamydia or gonorrhea. Ask your health care provider if you are at risk.  Ask your health care provider about whether you are at high risk for HIV. Your health care provider may recommend a prescription medicine to help prevent HIV infection. If you choose to take medicine to prevent HIV, you should first get tested for HIV. You should then be tested every 3 months for as long as you are taking the medicine. Pregnancy  If you are about to stop having your period (premenopausal) and you may become pregnant, seek counseling before you get pregnant.  Take 400 to 800 micrograms (mcg) of folic acid every day if you become pregnant.  Ask for birth control (contraception) if you want to prevent pregnancy. Osteoporosis and menopause Osteoporosis is a disease in which the bones lose minerals and strength with aging. This can result in bone fractures.  If you are 60 years old or older, or if you are at risk for osteoporosis and fractures, ask your health care provider if you should:  Be screened for bone loss.  Take a calcium or vitamin D supplement to lower your risk of fractures.  Be given hormone replacement therapy (HRT) to treat symptoms of menopause. Follow these instructions at home: Lifestyle  Do not use any products that contain nicotine or tobacco, such as cigarettes, e-cigarettes, and chewing tobacco. If you need help quitting, ask your health care provider.  Do not use street drugs.  Do not share needles.  Ask your health care provider for help if you need support or information about quitting drugs. Alcohol use  Do not drink alcohol if: ? Your health care provider tells you not to drink. ? You are pregnant, may be pregnant, or are planning to become pregnant.  If you drink alcohol: ? Limit how much you use to 0-1 drink a day. ? Limit intake if you are breastfeeding.  Be aware of how much alcohol is in your drink. In the [place] one drink equals one 12 oz bottle of beer (355 mL), one 5 oz glass of wine (148 mL), or one 1 oz glass of hard liquor (44 mL). General instructions  Schedule regular health, dental, and eye  exams.  Stay current with your vaccines.  Tell your health care provider if: ? You often feel depressed. ? You have ever been abused or do not feel safe at home. Summary  Adopting a healthy lifestyle and getting preventive care are important in promoting health and wellness.  Follow your health care provider's instructions about healthy diet, exercising, and getting tested or screened for diseases.  Follow your health care provider's instructions on monitoring your cholesterol and blood pressure. This information is not intended to replace advice given to you by your health care provider. Make sure you discuss any questions you have with your health care provider. Document Revised: 10/24/2018  Document Reviewed: 10/24/2018 Elsevier Patient Education  2020 [place] Firkus M.D.

## 2020-03-03 ENCOUNTER — Encounter: Payer: Self-pay | Admitting: Internal Medicine

## 2020-03-03 ENCOUNTER — Ambulatory Visit (INDEPENDENT_AMBULATORY_CARE_PROVIDER_SITE_OTHER): Payer: 59 | Admitting: Internal Medicine

## 2020-03-03 ENCOUNTER — Other Ambulatory Visit: Payer: Self-pay

## 2020-03-03 VITALS — BP 136/82 | HR 59 | Temp 98.2°F | Ht 63.0 in | Wt 143.6 lb

## 2020-03-03 DIAGNOSIS — R739 Hyperglycemia, unspecified: Secondary | ICD-10-CM

## 2020-03-03 DIAGNOSIS — Z0000 Encounter for general adult medical examination without abnormal findings: Secondary | ICD-10-CM | POA: Diagnosis not present

## 2020-03-03 DIAGNOSIS — Z79899 Other long term (current) drug therapy: Secondary | ICD-10-CM

## 2020-03-03 LAB — CBC WITH DIFFERENTIAL/PLATELET
Basophils Absolute: 0 10*3/uL (ref 0.0–0.1)
Basophils Relative: 1 % (ref 0.0–3.0)
Eosinophils Absolute: 0.1 10*3/uL (ref 0.0–0.7)
Eosinophils Relative: 1.5 % (ref 0.0–5.0)
HCT: 36.1 % (ref 36.0–46.0)
Hemoglobin: 12 g/dL (ref 12.0–15.0)
Lymphocytes Relative: 51.2 % — ABNORMAL HIGH (ref 12.0–46.0)
Lymphs Abs: 1.8 10*3/uL (ref 0.7–4.0)
MCHC: 33.2 g/dL (ref 30.0–36.0)
MCV: 85.5 fl (ref 78.0–100.0)
Monocytes Absolute: 0.3 10*3/uL (ref 0.1–1.0)
Monocytes Relative: 7.6 % (ref 3.0–12.0)
Neutro Abs: 1.4 10*3/uL (ref 1.4–7.7)
Neutrophils Relative %: 38.7 % — ABNORMAL LOW (ref 43.0–77.0)
Platelets: 248 10*3/uL (ref 150.0–400.0)
RBC: 4.23 Mil/uL (ref 3.87–5.11)
RDW: 12.7 % (ref 11.5–15.5)
WBC: 3.5 10*3/uL — ABNORMAL LOW (ref 4.0–10.5)

## 2020-03-03 LAB — HEPATIC FUNCTION PANEL
ALT: 13 U/L (ref 0–35)
AST: 14 U/L (ref 0–37)
Albumin: 4.5 g/dL (ref 3.5–5.2)
Alkaline Phosphatase: 62 U/L (ref 39–117)
Bilirubin, Direct: 0.1 mg/dL (ref 0.0–0.3)
Total Bilirubin: 0.6 mg/dL (ref 0.2–1.2)
Total Protein: 6.8 g/dL (ref 6.0–8.3)

## 2020-03-03 LAB — BASIC METABOLIC PANEL
BUN: 12 mg/dL (ref 6–23)
CO2: 30 mEq/L (ref 19–32)
Calcium: 9.5 mg/dL (ref 8.4–10.5)
Chloride: 101 mEq/L (ref 96–112)
Creatinine, Ser: 0.6 mg/dL (ref 0.40–1.20)
GFR: 123.24 mL/min (ref >60.00)
Glucose, Bld: 92 mg/dL (ref 70–99)
Potassium: 3.8 mEq/L (ref 3.5–5.1)
Sodium: 138 mEq/L (ref 135–145)

## 2020-03-03 LAB — LIPID PANEL
Cholesterol: 241 mg/dL — ABNORMAL HIGH (ref 0–200)
HDL: 45.8 mg/dL (ref >39.00)
LDL Cholesterol: 167 mg/dL — ABNORMAL HIGH (ref 0–99)
NonHDL: 194.88
Total CHOL/HDL Ratio: 5
Triglycerides: 137 mg/dL (ref 0.0–149.0)
VLDL: 27.4 mg/dL (ref 0.0–40.0)

## 2020-03-03 LAB — TSH: TSH: 1.28 u[IU]/mL (ref 0.35–4.50)

## 2020-03-03 LAB — HEMOGLOBIN A1C: Hgb A1c MFr Bld: 5.7 % (ref 4.6–6.5)

## 2020-03-03 NOTE — Progress Notes (Signed)
Results normal except cholesterol   Intensify lifestyle  recheck lipids in a year or as indicated  The 10-year ASCVD risk score Sharon Wells [name]., et al., 2013) is: 8.1%   Values used to calculate the score:     Age: 59 years     Sex: Female     Is Non-Hispanic African American: Yes     Diabetic: No     Tobacco smoker: No     Systolic Blood Pressure: [id number] mmHg     Is BP treated: No     HDL Cholesterol: 45.8 mg/dL     Total Cholesterol: 241 mg/dL

## 2020-03-03 NOTE — Patient Instructions (Signed)
Continue lifestyle intervention healthy eating and exercise .   Will notify you  of labs when available.    Health Maintenance, Female Adopting a healthy lifestyle and getting preventive care are important in promoting health and wellness. Ask your health care provider about:  The right schedule for you to have regular tests and exams.  Things you can do on your own to prevent diseases and keep yourself healthy. What should I know about diet, weight, and exercise? Eat a healthy diet   Eat a diet that includes plenty of vegetables, fruits, low-fat dairy products, and lean protein.  Do not eat a lot of foods that are high in solid fats, added sugars, or sodium. Maintain a healthy weight Body mass index (BMI) is used to identify weight problems. It estimates body fat based on height and weight. Your health care provider can help determine your BMI and help you achieve or maintain a healthy weight. Get regular exercise Get regular exercise. This is one of the most important things you can do for your health. Most adults should:  Exercise for at least 150 minutes each week. The exercise should increase your heart rate and make you sweat (moderate-intensity exercise).  Do strengthening exercises at least twice a week. This is in addition to the moderate-intensity exercise.  Spend less time sitting. Even light physical activity can be beneficial. Watch cholesterol and blood lipids Have your blood tested for lipids and cholesterol at 61 years of age, then have this test every 5 years. Have your cholesterol levels checked more often if:  Your lipid or cholesterol levels are high.  You are older than 61 years of age.  You are at high risk for heart disease. What should I know about cancer screening? Depending on your health history and family history, you may need to have cancer screening at various ages. This may include screening for:  Breast cancer.  Cervical  cancer.  Colorectal cancer.  Skin cancer.  Lung cancer. What should I know about heart disease, diabetes, and high blood pressure? Blood pressure and heart disease  High blood pressure causes heart disease and increases the risk of stroke. This is more likely to develop in people who have high blood pressure readings, are of African descent, or are overweight.  Have your blood pressure checked: ? Every 3-5 years if you are 18-39 years of age. ? Every year if you are 40 years old or older. Diabetes Have regular diabetes screenings. This checks your fasting blood sugar level. Have the screening done:  Once every three years after age 40 if you are at a normal weight and have a low risk for diabetes.  More often and at a younger age if you are overweight or have a high risk for diabetes. What should I know about preventing infection? Hepatitis B If you have a higher risk for hepatitis B, you should be screened for this virus. Talk with your health care provider to find out if you are at risk for hepatitis B infection. Hepatitis C Testing is recommended for:  Everyone born from 1945 through 1965.  Anyone with known risk factors for hepatitis C. Sexually transmitted infections (STIs)  Get screened for STIs, including gonorrhea and chlamydia, if: ? You are sexually active and are younger than 61 years of age. ? You are older than 61 years of age and your health care provider tells you that you are at risk for this type of infection. ? Your sexual activity has changed   has changed since you were last screened, and you are at increased risk for chlamydia or gonorrhea. Ask your health care provider if you are at risk.  Ask your health care provider about whether you are at high risk for HIV. Your health care provider may recommend a prescription medicine to help prevent HIV infection. If you choose to take medicine to prevent HIV, you should first get tested for HIV. You should then be tested every 3  months for as long as you are taking the medicine. Pregnancy  If you are about to stop having your period (premenopausal) and you may become pregnant, seek counseling before you get pregnant.  Take 400 to 800 micrograms (mcg) of folic acid every day if you become pregnant.  Ask for birth control (contraception) if you want to prevent pregnancy. Osteoporosis and menopause Osteoporosis is a disease in which the bones lose minerals and strength with aging. This can result in bone fractures. If you are 63 years old or older, or if you are at risk for osteoporosis and fractures, ask your health care provider if you should:  Be screened for bone loss.  Take a calcium or vitamin D supplement to lower your risk of fractures.  Be given hormone replacement therapy (HRT) to treat symptoms of menopause. Follow these instructions at home: Lifestyle  Do not use any products that contain nicotine or tobacco, such as cigarettes, e-cigarettes, and chewing tobacco. If you need help quitting, ask your health care provider.  Do not use street drugs.  Do not share needles.  Ask your health care provider for help if you need support or information about quitting drugs. Alcohol use  Do not drink alcohol if: ? Your health care provider tells you not to drink. ? You are pregnant, may be pregnant, or are planning to become pregnant.  If you drink alcohol: ? Limit how much you use to 0-1 drink a day. ? Limit intake if you are breastfeeding.  Be aware of how much alcohol is in your drink. In the [place] one drink equals one 12 oz bottle of beer (355 mL), one 5 oz glass of wine (148 mL), or one 1 oz glass of hard liquor (44 mL). General instructions  Schedule regular health, dental, and eye exams.  Stay current with your vaccines.  Tell your health care provider if: ? You often feel depressed. ? You have ever been abused or do not feel safe at home. Summary  Adopting a healthy lifestyle and getting  preventive care are important in promoting health and wellness.  Follow your health care provider's instructions about healthy diet, exercising, and getting tested or screened for diseases.  Follow your health care provider's instructions on monitoring your cholesterol and blood pressure. This information is not intended to replace advice given to you by your health care provider. Make sure you discuss any questions you have with your health care provider. Document Revised: 10/24/2018 Document Reviewed: 10/24/2018 Elsevier Patient Education  2020 [employer].

## 2020-06-09 ENCOUNTER — Telehealth: Payer: Self-pay | Admitting: Allergy & Immunology

## 2020-06-09 NOTE — Telephone Encounter (Signed)
Please advise her to schedule for an initial evaluation to determine next steps if she is interested. Thank you

## 2020-06-09 NOTE — Telephone Encounter (Signed)
Apologies about reply timing    My chart is a busy place these days .    So we  Are not writing covid immunization  exemptions  At this time in our practice .  Since you had  Reactions to injectables, I understand the  Concern. Although a different  Injection substance and no eggs .   ( allergy maybe to PEG)   Although you didn't have an adequate interaction with the allergist in the past   I still would like you to get a different opinion and perhaps a pre medication  And observation would still be  Ok .  So   A different  Allergy  Opinion about way to proceed  Would be helpful to me and you.   FYI the emergency use part of the vaccine   Is not a patient safety  Issue  ( except in a manufacturing process sense)

## 2020-06-09 NOTE — Telephone Encounter (Signed)
  Patient with abnormal throat feeling after getting flu shot. I believe this is more anxiety related as ativan is the only thing that helped partially relieve symptoms. Stable while watched in ED for several hours, no progression of symptoms. Low concern for true allergic reaction, highly doubt anaphylaxis. Stable for discharge.   Ephraim Hamburger, MD 09/04/14 1517  Ed note from reaction to flu vaccine

## 2020-06-09 NOTE — Telephone Encounter (Signed)
Can you please find out what her reaction to the influenza vaccine was and when this occurred? Thank you

## 2020-06-09 NOTE — Telephone Encounter (Signed)
Patient called and was instructed to contact an allergist to discuss mandatory COVID vaccine. Patient had a reaction to the flu vaccine and is nervous to get the COVID vaccine. No reactions to other vaccines, never had miralax, no other allergy besides egg that she is aware of.  Please advise if COVID component testing is needed.

## 2020-06-09 NOTE — Telephone Encounter (Signed)
Please advise to testing

## 2020-06-09 NOTE — Telephone Encounter (Signed)
See other message  answer

## 2020-06-10 ENCOUNTER — Other Ambulatory Visit: Payer: Self-pay

## 2020-06-10 ENCOUNTER — Ambulatory Visit (INDEPENDENT_AMBULATORY_CARE_PROVIDER_SITE_OTHER): Payer: 59 | Admitting: Allergy & Immunology

## 2020-06-10 DIAGNOSIS — T50B95D Adverse effect of other viral vaccines, subsequent encounter: Secondary | ICD-10-CM | POA: Diagnosis not present

## 2020-06-10 DIAGNOSIS — T50Z95D Adverse effect of other vaccines and biological substances, subsequent encounter: Secondary | ICD-10-CM

## 2020-06-10 NOTE — Telephone Encounter (Signed)
Patient scheduled today for a televisit at 1:30 with Dr. Ernst Bowler

## 2020-06-10 NOTE — Telephone Encounter (Signed)
Left voicemail to schedule a televisit with Dr, Ernst Bowler today at 1:30pm in [place] if patient is able.

## 2020-06-10 NOTE — Progress Notes (Signed)
RE: Sharon Wells MRN: [id number] DOB: 07-27-1961 Date of Telemedicine Visit: 06/10/2020  Referring provider: Burnis Medin, MD Primary care provider: Burnis Medin, MD  Chief Complaint: No chief complaint on file.   Telemedicine Follow Up Visit via Telephone: I connected with Sharon Wells for a follow up on 06/12/20 by telephone and verified that I am speaking with the correct person using two identifiers.   I discussed the limitations, risks, security and privacy concerns of performing an evaluation and management service by telephone and the availability of in person appointments. I also discussed with the patient that there may be a patient responsible charge related to this service. The patient expressed understanding and agreed to proceed.  Patient is at home.  Provider is at the office.  Visit start time: 1:40 PM Visit end time: 2:05 PM Insurance consent/check in by: Anderson Malta Medical consent and medical assistant/nurse: Garlon Hatchet  History of Present Illness:  She is a 59 y.o. female, who is presenting for an initial televisit to discuss her concerns with the [place] vaccination.   She reports that she lived in [place]. Then she came to [facility] and she was mandated strongly to get the flu shot. She ended up in the ED five years ago. She cannot remember the details but she knows that she did not feel right. She was sent to see an Allergist and had testing and ended up in the ED again. Now with the mandate of the [place] vaccination, she read the guidelines and it was recommended that she talk to a physician and discuss this with an allergist.  She received methylprednisolone, famotidine, and benadryl. She also received lorazepam.    She sent a message to Dr. Regis Bill and she explained the situation. She did not wan to give a letter to get her out of the option. She did have a colonoscopy performed recently which showed a polyp and this was removed. She did not have Miralax for her  clean out. There was some kind of other drink. It seems that she had a magnesium based solution (magnesium, potassium, and sodium sulfate).   She has had her childhood vaccinations, including hepatitis B vaccine. She has had no steroid injections and has not had epidurals. She has had no reactions to injections of any kind.   Otherwise, there have been no changes to her past medical history, surgical history, family history, or social history.  Assessment and Plan:  Sharon Wells is a 59 y.o. female with:  Adverse effect of vaccine and concern for vaccination to [place]  Given her history, I think it is prudent to go ahead and perform testing to the COVID19 vaccine components. We did discuss the predictive value of the testing, including the fact that a negative test will not necessarily mean that she will tolerate the COVID19 vaccination in the future. I discussed that we needed 4 hours or so for the entirety of the testing. She is in agreement with the plan.  Diagnostics: None.  Medication List:  Current Outpatient Medications  Medication Sig Dispense Refill  . cyanocobalamin 2000 MCG tablet Take 2,000 mcg by mouth daily.    . NON FORMULARY daily. NeoLife: Multivitamin Complex with TRE-EN-EN Grain Concentrates     . Omega-3 Fatty Acids (FISH OIL) 1000 MG CAPS Take 1,000 mg by mouth daily.      No current facility-administered medications for this visit.   Allergies: Allergies  Allergen Reactions  . Eggs Or Egg-Derived Products Nausea And Vomiting  Had  Ed visit after flu vaccine and seen bu allergist    I reviewed her past medical history, social history, family history, and environmental history and no significant changes have been reported from previous visits.  Review of Systems  Constitutional: Negative for activity change and appetite change.  HENT: Negative for congestion, postnasal drip, rhinorrhea, sinus pressure and sore throat.   Eyes: Negative for pain, discharge, redness  and itching.  Respiratory: Negative for shortness of breath, wheezing and stridor.   Gastrointestinal: Negative for diarrhea, nausea and vomiting.  Endocrine: Negative for cold intolerance and heat intolerance.  Musculoskeletal: Negative for arthralgias, joint swelling and myalgias.  Skin: Negative for rash.  Allergic/Immunologic: Negative for environmental allergies and food allergies.    Objective:  Physical exam not obtained as encounter was done via telephone.   Previous notes and tests were reviewed.  I discussed the assessment and treatment plan with the patient. The patient was provided an opportunity to ask questions and all were answered. The patient agreed with the plan and demonstrated an understanding of the instructions.   The patient was advised to call back or seek an in-person evaluation if the symptoms worsen or if the condition fails to improve as anticipated.  I provided 25 minutes of non-face-to-face time during this encounter.  It was my pleasure to participate in [place] care today. Please feel free to contact me with any questions or concerns.   Sincerely,  Valentina Shaggy, MD

## 2020-06-11 ENCOUNTER — Telehealth: Payer: Self-pay | Admitting: Family Medicine

## 2020-06-11 NOTE — Telephone Encounter (Signed)
Please conduct letter

## 2020-06-11 NOTE — Telephone Encounter (Signed)
Patient is in need of letter on [employer] letter head stating she is due for her component testing.

## 2020-06-11 NOTE — Telephone Encounter (Signed)
I see that  You have appt with allergest  Will be looking forward to opinion

## 2020-06-12 ENCOUNTER — Encounter: Payer: Self-pay | Admitting: Allergy & Immunology

## 2020-06-14 NOTE — Telephone Encounter (Signed)
Letter written and printed at the nursing station in [place].  Salvatore Marvel, MD Allergy and [facility] [place]

## 2020-06-15 NOTE — Telephone Encounter (Signed)
Patient notified and letter was sent to patient's work fax per patient's request @ [phone].

## 2020-07-13 ENCOUNTER — Ambulatory Visit (INDEPENDENT_AMBULATORY_CARE_PROVIDER_SITE_OTHER): Payer: 59 | Admitting: Allergy & Immunology

## 2020-07-13 ENCOUNTER — Ambulatory Visit: Payer: 59

## 2020-07-13 ENCOUNTER — Other Ambulatory Visit: Payer: Self-pay

## 2020-07-13 ENCOUNTER — Encounter: Payer: Self-pay | Admitting: Allergy & Immunology

## 2020-07-13 VITALS — BP 150/80 | HR 70 | Resp 16

## 2020-07-13 DIAGNOSIS — T50B95D Adverse effect of other viral vaccines, subsequent encounter: Secondary | ICD-10-CM

## 2020-07-13 DIAGNOSIS — T50995A Adverse effect of other drugs, medicaments and biological substances, initial encounter: Secondary | ICD-10-CM

## 2020-07-13 DIAGNOSIS — Z888 Allergy status to other drugs, medicaments and biological substances status: Secondary | ICD-10-CM

## 2020-07-13 DIAGNOSIS — Z23 Encounter for immunization: Secondary | ICD-10-CM

## 2020-07-13 DIAGNOSIS — T50Z95D Adverse effect of other vaccines and biological substances, subsequent encounter: Secondary | ICD-10-CM

## 2020-07-13 NOTE — Patient Instructions (Signed)
1. Vaccines and biological substances causing adverse effect - Testing today was negative, including the challenge to the Miralax (PEG). - There is currently no definite skin testing available for the Pfizer and Moderna COVID vaccines and data is limited however, there has been a concern regarding sensitivity to PEG in patients who had anaphylactic reactions to the COVID-19 vaccine. - Skin testing was negative to Miralax (source of PEG 3350), methylprednisolone acetate (source of PEG 3350), and triamcinolone acetonide (source of polysorbate-80). - You tolerated the Pfzier vaccine challenge in the clinic today. - Make an appointment on your way out for your second Pfizer vaccine in three weeks.   2. Follow up in three weeks for Pfizer vaccine #2.    Please inform us of any Emergency Department visits, hospitalizations, or changes in symptoms. Call us before going to the ED for breathing or allergy symptoms since we might be able to fit you in for a sick visit. Feel free to contact us anytime with any questions, problems, or concerns.  It was a pleasure to meet you in person today!  Websites that have reliable patient information: 1. American Academy of Asthma, Allergy, and Immunology: www.aaaai.org 2. Food Allergy Research and Education (FARE): foodallergy.org 3. Mothers of Asthmatics: http://www.asthmacommunitynetwork.org 4. American College of Allergy, Asthma, and Immunology: www.acaai.org   COVID-19 Vaccine Information can be found at: ShippingScam.co.uk For questions related to vaccine distribution or appointments, please email vaccine@[place] .com or call [phone].     "Like" [place] on Facebook and Instagram for our latest updates!        Make sure you are registered to vote! If you have moved or changed any of your contact information, you will need to get this updated before voting!  In some cases, you MAY be able to  register to vote online: [email or web address]

## 2020-07-13 NOTE — Progress Notes (Signed)
FOLLOW UP  Date of Service/Encounter:  07/13/20   Assessment:   Vaccines and biological substances causing adverse effect - with negative testing to COVID19 vaccine components and tolerance of dose #1 of the Pfizer vaccination  Plan/Recommendations:   1. Vaccines and biological substances causing adverse effect - Testing today was negative, including the challenge to the Miralax (PEG). - There is currently no definite skin testing available for the Pfizer and Moderna COVID vaccines and data is limited however, there has been a concern regarding sensitivity to PEG in patients who had anaphylactic reactions to the COVID-19 vaccine. - Skin testing was negative to Miralax (source of PEG 3350), methylprednisolone acetate (source of PEG 3350), and triamcinolone acetonide (source of polysorbate-80). - You tolerated the Pfzier vaccine challenge in the clinic today. - Make an appointment on your way out for your second Pfizer vaccine in three weeks.   2. Follow up in three weeks for Pfizer vaccine #2.   Subjective:   Sharon Wells is a 59 y.o. female presenting today for follow up of  Chief Complaint  Patient presents with  . Food/Drug Challenge    Covid Vaccinne Testing    Sharon Wells has a history of the following: Patient Active Problem List   Diagnosis Date Noted  . Essential hypertension 05/02/2017  . Adverse reaction to vaccine 09/03/2014  . Elevated blood pressure 09/03/2014  . Other specified anemias 07/16/2014  . Preventive measure 07/16/2014    History obtained from: chart review and patient.  Sharon Wells is a 59 y.o. female presenting for skin testing and drug challenge.  She was last seen in July 2021 via televisit.  At that time, given her history, I felt that it was prudent to go ahead and perform skin testing to the COVID-19 vaccine components.  We did discuss that the predictive value is not known, but she would feel better having the testing under her  belt.  Since last visit, she has done well.  She has been off of all antihistamines since last visit.  Otherwise, there have been no changes to her past medical history, surgical history, family history, or social history.    Review of Systems  Constitutional: Negative.  Negative for fever, malaise/fatigue and weight loss.  HENT: Negative.  Negative for congestion, ear discharge and ear pain.   Eyes: Negative for pain, discharge and redness.  Respiratory: Negative for cough, sputum production, shortness of breath and wheezing.   Cardiovascular: Negative.  Negative for chest pain and palpitations.  Gastrointestinal: Negative for abdominal pain and heartburn.  Skin: Negative.  Negative for itching and rash.  Neurological: Negative for dizziness and headaches.  Endo/Heme/Allergies: Negative for environmental allergies. Does not bruise/bleed easily.       Objective:   Blood pressure (!) 150/80, pulse 70, resp. rate 16, last menstrual period 12/15/2013, SpO2 99 %. There is no height or weight on file to calculate BMI.   Physical Exam: deferred since this was a skin testing and challenge appointment only   Diagnostic studies:   Allergy Studies:   SKIN TESTING (CPT 95018)  SKIN PRICK TESTING ARM #1 SIZE TIME  1. Histamine (1.8mg /mL) 2+ See scanned flowsheet  2. Control (negative - HSA) Negative See scanned flowsheet  3. Triamcinolone (40mg /mL) Negative See scanned flowsheet  4. Methylprednisolone (40mg /mL) Negative See scanned flowsheet  WAIT 15 MINUTES  5. Miralax (1:100 or 1.7mg /mL) Negative See scanned flowsheet  WAIT 15 MINUTES  6. Miralax (1:10 or 17mg /mL) Negative See scanned flowsheet  WAIT 15 MINUTES  7. Miralax (1:1 or 170mg /mL) Negative See scanned flowsheet  WAIT 15 MINUTES   PUNCTURE TEST TOTAL 7    INTRADERMAL TESTING ARM #2 SIZE TIME  1. Control (negative - HSA) Negative See scanned flowsheet  2. Triamcinolone (1:100) Negative See scanned flowsheet   3. Methylprednisolone (1:100) Negative See scanned flowsheet  WAIT 15 MINUTES  4. Triamcinolone (1:10) Negative See scanned flowsheet  5. Methylprednisolone (1:10) Negative See scanned flowsheet  WAIT 15 MINUTES  6. Triamcinolone (1:1) Negative See scanned flowsheet  WAIT 15 MINUTES   INTRADERMAL TEST TOTAL 6    ORAL CHALLENGE (CPT 95076)  DOSE Pre-Vitals (BP/HR/Resp) TIME GIVEN  1. Miralax 170mg /mL suspension 0.70mL  Within normal limits See scanned flow sheet   See scanned flow sheet   WAIT 20 MINUTES  2. Miralax 170mg /mL suspension 32mL  Within normal limits See scanned flow sheet   See scanned flow sheet   WAIT 20 MINUTES  3. Miralax 170mg /mL suspension 72mL  Within normal limits See scanned flow sheet   See scanned flow sheet   WAIT 30-60 MINUTES (To be determined by the provider)   Post-Vitals (BP/HR/Resp)  Within normal limits See scanned flow sheet END TIME  See scanned flow sheet     Total Time     Patient tolerated the Pfizer COVID-19 vaccine in a graded fashion.  She received 0.1 mL and was monitored for 15 minutes.  She then received 0.2 mL and was monitored for 30 minutes.  Vitals were normal.  See the flow sheet for vitals.   Allergy testing results were read and interpreted by myself, documented by clinical staff.      Salvatore Marvel, MD  [facility]ergy and [facility] [place]

## 2020-07-15 NOTE — Progress Notes (Signed)
° °  Covid-19 Vaccination Clinic  Name:  Sharon Wells    MRN: [id number] DOB: [birth year]  07/15/2020  [name] was observed post Covid-19 immunization for 30 minutes based on pre-vaccination screening without incident. She was provided with Vaccine Information Sheet and instruction to access the V-Safe system.   [name] was instructed to call 911 with any severe reactions post vaccine:  Difficulty breathing   Swelling of face and throat   A fast heartbeat   A bad rash all over body   Dizziness and weakness

## 2020-07-16 ENCOUNTER — Encounter: Payer: Self-pay | Admitting: Allergy & Immunology

## 2020-07-21 ENCOUNTER — Encounter: Payer: 59 | Admitting: Family Medicine

## 2020-07-23 ENCOUNTER — Other Ambulatory Visit: Payer: Self-pay

## 2020-08-03 ENCOUNTER — Other Ambulatory Visit: Payer: Self-pay

## 2020-08-03 ENCOUNTER — Ambulatory Visit (INDEPENDENT_AMBULATORY_CARE_PROVIDER_SITE_OTHER): Payer: 59

## 2020-08-03 DIAGNOSIS — Z23 Encounter for immunization: Secondary | ICD-10-CM

## 2020-08-03 NOTE — Progress Notes (Signed)
   Covid-19 Vaccination Clinic  Name:  Sharon Wells    MRN: [id number] DOB: Jul 14, 1961  08/03/2020  [name] was observed post Covid-19 immunization for 15 minutes without incident. She was provided with Vaccine Information Sheet and instruction to access the V-Safe system.   [name] was instructed to call 911 with any severe reactions post vaccine: Marland Kitchen Difficulty breathing  . Swelling of face and throat  . A fast heartbeat  . A bad rash all over body  . Dizziness and weakness   Immunizations Administered    Name Date Dose VIS Date Route   Pfizer COVID-19 Vaccine 08/03/2020 11:46 AM 0.3 mL 01/08/2019 Intramuscular   Manufacturer: [employer], [employer]   Lot: [id number]   [place]: [address]-4

## 2020-08-31 ENCOUNTER — Other Ambulatory Visit: Payer: Self-pay

## 2020-08-31 ENCOUNTER — Ambulatory Visit (INDEPENDENT_AMBULATORY_CARE_PROVIDER_SITE_OTHER): Payer: 59 | Admitting: Obstetrics & Gynecology

## 2020-08-31 ENCOUNTER — Encounter: Payer: Self-pay | Admitting: Obstetrics & Gynecology

## 2020-08-31 VITALS — BP 130/74 | Ht 63.0 in | Wt 142.0 lb

## 2020-08-31 DIAGNOSIS — Z01419 Encounter for gynecological examination (general) (routine) without abnormal findings: Secondary | ICD-10-CM

## 2020-08-31 DIAGNOSIS — Z780 Asymptomatic menopausal state: Secondary | ICD-10-CM | POA: Diagnosis not present

## 2020-08-31 DIAGNOSIS — D219 Benign neoplasm of connective and other soft tissue, unspecified: Secondary | ICD-10-CM

## 2020-08-31 DIAGNOSIS — R87610 Atypical squamous cells of undetermined significance on cytologic smear of cervix (ASC-US): Secondary | ICD-10-CM | POA: Diagnosis not present

## 2020-08-31 NOTE — Progress Notes (Signed)
Sharon Wells 11-30-1960 [id number]   History:    59 y.o. G3P3L3 Divorced.  Has grand-children.  RP:  Established patient presenting for annual gyn exam   HPI: Postmenopausal, well on no hormone replacement therapy.  No postmenopausal bleeding.  No pelvic pain.  Abstinent.  Urine and bowel movements normal.  Breasts normal.  Body mass index 25.15.  Good fitness, walking.  Low Cholesterol diet. Following hypercholesterolemia with Dr. Regis Bill.  Past medical history,surgical history, family history and social history were all reviewed and documented in the EPIC chart.  Gynecologic History Patient's last menstrual period was 12/15/2013.  Obstetric History OB History  Gravida Para Term Preterm AB Living  3 3 3     3   SAB TAB Ectopic Multiple Live Births               # Outcome Date GA Lbr Len/2nd Weight Sex Delivery Anes PTL Lv  3 Term           2 Term           1 Term              ROS: A ROS was performed and pertinent positives and negatives are included in the history.  GENERAL: No fevers or chills. HEENT: No change in vision, no earache, sore throat or sinus congestion. NECK: No pain or stiffness. CARDIOVASCULAR: No chest pain or pressure. No palpitations. PULMONARY: No shortness of breath, cough or wheeze. GASTROINTESTINAL: No abdominal pain, nausea, vomiting or diarrhea, melena or bright red blood per rectum.  Good body mass index at 25.15.  Continue with fitness and healthy nutrition.  GENITOURINARY: No urinary frequency, urgency, hesitancy or dysuria. MUSCULOSKELETAL: No joint or muscle pain, no back pain, no recent trauma. DERMATOLOGIC: No rash, no itching, no lesions. ENDOCRINE: No polyuria, polydipsia, no heat or cold intolerance. No recent change in weight. HEMATOLOGICAL: No anemia or easy bruising or bleeding. NEUROLOGIC: No headache, seizures, numbness, tingling or weakness. PSYCHIATRIC: No depression, no loss of interest in normal activity or change in sleep pattern.       Exam:   BP 130/74   Ht 5\' 3"  (1.6 m)   Wt 142 lb (64.4 kg)   LMP 12/15/2013   BMI 25.15 kg/m   Body mass index is 25.15 kg/m.  General appearance : Well developed well nourished female. No acute distress HEENT: Eyes: no retinal hemorrhage or exudates,  Neck supple, trachea midline, no carotid bruits, no thyroidmegaly Lungs: Clear to auscultation, no rhonchi or wheezes, or rib retractions  Heart: Regular rate and rhythm, no murmurs or gallops Breast:Examined in sitting and supine position were symmetrical in appearance, no palpable masses or tenderness,  no skin retraction, no nipple inversion, no nipple discharge, no skin discoloration, no axillary or supraclavicular lymphadenopathy Abdomen: no palpable masses or tenderness, no rebound or guarding Extremities: no edema or skin discoloration or tenderness  Pelvic: Vulva: Normal             Vagina: No gross lesions or discharge  Cervix: No gross lesions or discharge.  Pap reflex done.  Uterus  AV, stable with Fibroids, non-tender and mobile  Adnexa  Without masses or tenderness  Anus: Normal   Assessment/Plan:  59 y.o. female for annual exam   1. Encounter for routine gynecological examination with Papanicolaou smear of cervix Gynecologic exam stable with a symptomatic fibroids.  Pap reflex done.  Breast exam normal.  Last screening mammogram December 2020 was negative.  Colonoscopy November  2019.  Health labs with Dr. Regis Bill.  Followed for hypercholesterolemia.  Body mass index 25.15.  Continue with fitness and healthy nutrition.  2. Post-menopausal Well on no hormone replacement therapy.  No postmenopausal bleeding.  Bone density in 2019 was normal, will repeat at 5 years.  Continue with regular weightbearing physical activities, patient enjoys walking.  Recommend vitamin D supplements, calcium intake of 1500 mg daily through nutrition.  3. Fibroids Stable asymptomatic uterine fibroids.  Observing.  Princess Bruins MD,  8:13 AM 08/31/2020

## 2020-08-31 NOTE — Addendum Note (Signed)
Addended by: Thurnell Garbe A on: 08/31/2020 09:38 AM   Modules accepted: Orders

## 2020-09-01 ENCOUNTER — Other Ambulatory Visit: Payer: Self-pay | Admitting: Obstetrics & Gynecology

## 2020-09-01 DIAGNOSIS — Z1231 Encounter for screening mammogram for malignant neoplasm of breast: Secondary | ICD-10-CM

## 2020-09-03 LAB — PAP IG W/ RFLX HPV ASCU

## 2020-09-03 LAB — HUMAN PAPILLOMAVIRUS, HIGH RISK: HPV DNA High Risk: NOT DETECTED

## 2020-10-20 ENCOUNTER — Ambulatory Visit
Admission: RE | Admit: 2020-10-20 | Discharge: 2020-10-20 | Disposition: A | Discharge status: Home / Self Care | Payer: 59 | Source: Ambulatory Visit | Attending: Obstetrics & Gynecology | Admitting: Obstetrics & Gynecology

## 2020-10-20 ENCOUNTER — Other Ambulatory Visit: Payer: Self-pay

## 2020-10-20 DIAGNOSIS — Z1231 Encounter for screening mammogram for malignant neoplasm of breast: Secondary | ICD-10-CM | POA: Diagnosis not present

## 2020-11-16 DIAGNOSIS — H5213 Myopia, bilateral: Secondary | ICD-10-CM | POA: Diagnosis not present

## 2020-12-14 NOTE — Telephone Encounter (Signed)
Get her appt  tomorrow or wednesday

## 2020-12-21 NOTE — Progress Notes (Signed)
Chief Complaint  Patient presents with  . GI Problem    Wants recommendations    HPI: Sharon Wells 60 y.o. come in for  New concern She sent in a message that Saturday, 29 January had a severe left upper back pain while she was at home that lasted hours and it radiated down to the lower back pain and off that she consider going to the emergency room.  Thought it was trapped gas but had no abdominal pain radiation to the groin.  She had no trauma but have been doing general exercises and he never had that pain before.  Wonders if it was gas because over the last month she has had a change in her bowel habits. Has noted some constipation with certain foods and bloating gassy and belching. Losing weight on purpose had constipation.   Normal bowel habits were daily or more but she has now been skipping days working on hydration.  She began omeprazole again had been on in the past wondering if it was reflux although no heartburn with it it seems to have helped a little but tends to have a really growly stomach. s    2019 last: Polyp and 5 year recall .   You tube  Videos .  Is exercises. Sees GYN. ROS: See pertinent positives and negatives per HPI.  No fever chills cough pleurisy symptoms.  No hematuria   Past Medical History:  Diagnosis Date  . Chicken pox   . Fibroids    With history of anemia  . Hyperlipidemia     Family History  Problem Relation Age of Onset  . Sarcoidosis Mother        Deceased in her 40s  . Hypertension Sister   . Diabetes Sister   . Sarcoidosis Sister   . COPD Sister        Emphysema  . Pulmonary embolism Sister        Apparently am provoked  . Heart disease Maternal Grandmother   . Breast cancer Neg Hx   . Colon cancer Neg Hx   . Esophageal cancer Neg Hx   . Stomach cancer Neg Hx   . Rectal cancer Neg Hx     Social History   Socioeconomic History  . Marital status: Divorced    Spouse name: Not on file  . Number of children: Not on file   . Years of education: Not on file  . Highest education level: Not on file  Occupational History  . Not on file  Tobacco Use  . Smoking status: Never Smoker  . Smokeless tobacco: Never Used  Vaping Use  . Vaping Use: Never used  Substance and Sexual Activity  . Alcohol use: No  . Drug use: No  . Sexual activity: Not Currently    Comment: 1ST intercourse- 15, partners- 2,   Other Topics Concern  . 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   Social Determinants of Health   Financial Resource Strain: Not on file  Food Insecurity: Not on file  Transportation Needs: Not on file  Physical  Activity: Not on file  Stress: Not on file  Social Connections: Not on file    Outpatient Medications Prior to Visit  Medication Sig Dispense Refill  . Ascorbic Acid (VITA-C PO) Take by mouth.    . cyanocobalamin 2000 MCG tablet Take 2,000 mcg by mouth daily.    . NON FORMULARY daily. [employer]: Multivitamin Complex with TRE-EN-EN Grain Concentrates     No facility-administered medications prior to visit.     EXAM:  BP (!) 120/50 (BP Location: Right Arm, Patient Position: Sitting, Cuff Size: Normal)   Pulse 82   Temp 98.2 F (36.8 C) (Oral)   Ht 5' (1.524 m)   Wt 140 lb 3.2 oz (63.6 kg)   LMP 12/15/2013   SpO2 99%   BMI 27.38 kg/m   Body mass index is 27.38 kg/m. Wt Readings from Last 3 Encounters:  12/22/20 140 lb 3.2 oz (63.6 kg)  08/31/20 142 lb (64.4 kg)  03/03/20 143 lb 9.6 oz (65.1 kg)    GENERAL: vitals reviewed and listed above, alert, oriented, appears well hydrated and in no acute distress HEENT: atraumatic, conjunctiva  clear, no obvious abnormalities on  inspection of external nose and ears OP : Masked NECK: no obvious masses on inspection palpation  LUNGS: clear to auscultation bilaterally, no wheezes, rales or rhonchi, good air movement CV: HRRR, no clubbing cyanosis or  peripheral edema nl cap refill  Abdomen soft without organomegaly guarding or rebound noted. MS: moves all extremities without noticeable focal  abnormality PSYCH: pleasant and cooperative, no obvious depression or anxiety Lab Results  Component Value Date   WBC 3.5 (L) 03/03/2020   HGB 12.0 03/03/2020   HCT 36.1 03/03/2020   PLT 248.0 03/03/2020   GLUCOSE 92 03/03/2020   CHOL 241 (H) 03/03/2020   TRIG 137.0 03/03/2020   HDL 45.80 03/03/2020   LDLCALC 167 (H) 03/03/2020   ALT 13 03/03/2020   AST 14 03/03/2020   NA 138 03/03/2020   K 3.8 03/03/2020   CL 101 03/03/2020   CREATININE 0.60 03/03/2020   BUN 12 03/03/2020   CO2 30 03/03/2020   TSH 1.28 03/03/2020   HGBA1C 5.7 03/03/2020   BP Readings from Last 3 Encounters:  12/22/20 (!) 120/50  08/31/20 130/74  07/13/20 (!) 150/80  The 10-year ASCVD risk score Denman George DC Jr., et al., 2013) is: 6.2%   Values used to calculate the score:     Age: 50 years     Sex: Female     Is Non-Hispanic African American: Yes     Diabetic: No     Tobacco smoker: No     Systolic Blood Pressure: 120 mmHg     Is BP treated: No     HDL Cholesterol: 45.8 mg/dL     Total Cholesterol: 241 mg/dL   ASSESSMENT AND PLAN:  Discussed the following assessment and plan:  Change in bowel habit - Plan: Comprehensive metabolic panel, CBC with Differential/Platelet, C-reactive protein, C-reactive protein, CBC with Differential/Platelet, Comprehensive metabolic panel, CANCELED: POCT Urinalysis Dipstick (Automated)  Acute left-sided thoracic back pain - Plan: Comprehensive metabolic panel, CBC with Differential/Platelet, C-reactive protein, C-reactive protein, CBC with Differential/Platelet, Comprehensive metabolic panel, POCT urinalysis  dipstick, Urine Culture, CANCELED: POCT Urinalysis Dipstick (Automated)  Belching symptom - Plan: Comprehensive metabolic panel, CBC with Differential/Platelet, C-reactive protein, C-reactive protein, CBC with Differential/Platelet, Comprehensive metabolic panel, CANCELED: POCT Urinalysis Dipstick (Automated) Uncertain cause of her back pain could have been mechanical but was really quite severe suspicious kind of  pain for renal stone but not typical radiation or recurrence. Change in bowel habits could be constipation from dietary change in continued but uncertain at this time Would suggest she see GI for their opinion also whether imaging or other is needed. Check blood work baseline and UA today. -Patient advised to return or notify health care team  if  new concerns arise.  Patient Instructions  Plan urine and lab today   Not sure if pain was possibly a kidney stone or a back issue.   With change in bowel habits want you to see   GI  Dr. Rhea Belton. Team.       Neta Mends. Ondrea Dow M.D.

## 2020-12-22 ENCOUNTER — Encounter: Payer: Self-pay | Admitting: Internal Medicine

## 2020-12-22 ENCOUNTER — Other Ambulatory Visit: Payer: Self-pay

## 2020-12-22 ENCOUNTER — Ambulatory Visit: Payer: 59 | Admitting: Internal Medicine

## 2020-12-22 VITALS — BP 120/50 | HR 82 | Temp 98.2°F | Ht 60.0 in | Wt 140.2 lb

## 2020-12-22 DIAGNOSIS — R194 Change in bowel habit: Secondary | ICD-10-CM

## 2020-12-22 DIAGNOSIS — M546 Pain in thoracic spine: Secondary | ICD-10-CM | POA: Diagnosis not present

## 2020-12-22 DIAGNOSIS — R142 Eructation: Secondary | ICD-10-CM | POA: Diagnosis not present

## 2020-12-22 LAB — POCT URINALYSIS DIPSTICK
Bilirubin, UA: NEGATIVE
Blood, UA: NEGATIVE
Glucose, UA: NEGATIVE
Ketones, UA: NEGATIVE
Nitrite, UA: NEGATIVE
Protein, UA: NEGATIVE
Spec Grav, UA: 1.025 (ref 1.010–1.025)
Urobilinogen, UA: 0.2 E.U./dL
pH, UA: 6 (ref 5.0–8.0)

## 2020-12-22 NOTE — Patient Instructions (Signed)
Plan urine and lab today   Not sure if pain was possibly a kidney stone or a back issue.   With change in bowel habits want you to see   GI  Dr. Hilarie Fredrickson. Team.

## 2020-12-23 DIAGNOSIS — M546 Pain in thoracic spine: Secondary | ICD-10-CM | POA: Diagnosis not present

## 2020-12-23 LAB — CBC WITH DIFFERENTIAL/PLATELET
Basophils Absolute: 0.1 10*3/uL (ref 0.0–0.1)
Basophils Relative: 1.8 % (ref 0.0–3.0)
Eosinophils Absolute: 0 10*3/uL (ref 0.0–0.7)
Eosinophils Relative: 1.2 % (ref 0.0–5.0)
HCT: 35.9 % — ABNORMAL LOW (ref 36.0–46.0)
Hemoglobin: 11.9 g/dL — ABNORMAL LOW (ref 12.0–15.0)
Lymphocytes Relative: 49.8 % — ABNORMAL HIGH (ref 12.0–46.0)
Lymphs Abs: 1.9 10*3/uL (ref 0.7–4.0)
MCHC: 33.2 g/dL (ref 30.0–36.0)
MCV: 84.8 fl (ref 78.0–100.0)
Monocytes Absolute: 0.3 10*3/uL (ref 0.1–1.0)
Monocytes Relative: 7.1 % (ref 3.0–12.0)
Neutro Abs: 1.6 10*3/uL (ref 1.4–7.7)
Neutrophils Relative %: 40.1 % — ABNORMAL LOW (ref 43.0–77.0)
Platelets: 271 10*3/uL (ref 150.0–400.0)
RBC: 4.24 Mil/uL (ref 3.87–5.11)
RDW: 12.7 % (ref 11.5–15.5)
WBC: 3.9 10*3/uL — ABNORMAL LOW (ref 4.0–10.5)

## 2020-12-23 LAB — C-REACTIVE PROTEIN: CRP: <1 mg/dL (ref 0.5–20.0)

## 2020-12-23 LAB — COMPREHENSIVE METABOLIC PANEL
ALT: 9 U/L (ref 0–35)
AST: 13 U/L (ref 0–37)
Albumin: 4.4 g/dL (ref 3.5–5.2)
Alkaline Phosphatase: 65 U/L (ref 39–117)
BUN: 10 mg/dL (ref 6–23)
CO2: 31 mEq/L (ref 19–32)
Calcium: 9.6 mg/dL (ref 8.4–10.5)
Chloride: 103 mEq/L (ref 96–112)
Creatinine, Ser: 0.65 mg/dL (ref 0.40–1.20)
GFR: 95.19 mL/min (ref >60.00)
Glucose, Bld: 88 mg/dL (ref 70–99)
Potassium: 3.9 mEq/L (ref 3.5–5.1)
Sodium: 140 mEq/L (ref 135–145)
Total Bilirubin: 0.5 mg/dL (ref 0.2–1.2)
Total Protein: 6.9 g/dL (ref 6.0–8.3)

## 2020-12-23 NOTE — Addendum Note (Signed)
Addended by: Tessie Fass D on: 12/23/2020 08:19 AM   Modules accepted: Orders

## 2020-12-24 DIAGNOSIS — R8290 Unspecified abnormal findings in urine: Secondary | ICD-10-CM

## 2020-12-24 LAB — URINE CULTURE
MICRO NUMBER:: 11514010
SPECIMEN QUALITY:: ADEQUATE

## 2020-12-25 NOTE — Telephone Encounter (Signed)
Yes  we did a culture  But not confirming  UTI   So probably not  .  We can repeat a better clean catch  At the [facility] lab if you feel having uti  Sx

## 2020-12-27 NOTE — Progress Notes (Signed)
Urine no infection, borderline anemia  please fu with Gi team as we planned

## 2021-01-12 ENCOUNTER — Encounter: Payer: Self-pay | Admitting: Internal Medicine

## 2021-01-13 ENCOUNTER — Other Ambulatory Visit (INDEPENDENT_AMBULATORY_CARE_PROVIDER_SITE_OTHER): Payer: 59

## 2021-01-13 ENCOUNTER — Encounter: Payer: Self-pay | Admitting: Gastroenterology

## 2021-01-13 ENCOUNTER — Ambulatory Visit (INDEPENDENT_AMBULATORY_CARE_PROVIDER_SITE_OTHER): Payer: 59 | Admitting: Gastroenterology

## 2021-01-13 ENCOUNTER — Other Ambulatory Visit: Payer: Self-pay

## 2021-01-13 VITALS — BP 120/74 | HR 76 | Ht 63.0 in | Wt 141.0 lb

## 2021-01-13 DIAGNOSIS — R194 Change in bowel habit: Secondary | ICD-10-CM

## 2021-01-13 DIAGNOSIS — R143 Flatulence: Secondary | ICD-10-CM | POA: Diagnosis not present

## 2021-01-13 DIAGNOSIS — R142 Eructation: Secondary | ICD-10-CM | POA: Insufficient documentation

## 2021-01-13 NOTE — Progress Notes (Signed)
01/13/2021 Sharon Wells [id number] 24-Nov-1960   HISTORY OF PRESENT ILLNESS: This is a pleasant 60 year old female who is a patient of Dr. Vena Rua, known to him only for colonoscopy in November 2019.  At that time she had just one 5 mm polyp that was removed and was a leiomyoma, diverticulosis, and internal hemorrhoids.  She is here today with a complaint of change in bowel habits.  She said that kind of out of the blue she started experiencing some episodes of constipation.  Now that has really resolved, but she does have the sensation that she does not feel completely evacuated with bowel movements.  She also complains of a lot of belching.  She takes omeprazole on occasion, which does seem to help with the belching some.  She does not feel any overt heartburn or reflux.  She denies any rectal bleeding.  No abdominal pain.   Past Medical History:  Diagnosis Date  . Chicken pox   . Fibroids    With history of anemia  . Hyperlipidemia    Past Surgical History:  Procedure Laterality Date  . COLONOSCOPY  2013   In [place]  . NO PAST SURGERIES      reports that she has never smoked. She has never used smokeless tobacco. She reports that she does not drink alcohol and does not use drugs. family history includes COPD in her sister; Diabetes in her sister; Heart disease in her maternal grandmother; Hypertension in her sister; Pulmonary embolism in her sister; Sarcoidosis in her mother and sister. Allergies  Allergen Reactions  . Eggs Or Egg-Derived Products Nausea And Vomiting    Had  Ed visit after flu vaccine and seen bu allergist       Outpatient Encounter Medications as of 01/13/2021  Medication Sig  . Ascorbic Acid (VITA-C PO) Take by mouth.  . cyanocobalamin 2000 MCG tablet Take 2,000 mcg by mouth daily.  . NON FORMULARY daily. [employer]: Multivitamin Complex with TRE-EN-EN Grain Concentrates   No facility-administered encounter medications on file as of 01/13/2021.      REVIEW OF SYSTEMS  : All other systems reviewed and negative except where noted in the History of Present Illness.   PHYSICAL EXAM: BP 120/74 (BP Location: Left Arm, Patient Position: Sitting, Cuff Size: Normal)   Pulse 76   Ht 5\' 3"  (1.6 m)   Wt 141 lb (64 kg)   LMP 12/15/2013   BMI 24.98 kg/m  General: Well developed AA female in no acute distress Head: Normocephalic and atraumatic Eyes:  Sclerae anicteric, conjunctiva pink. Ears: Normal auditory acuity Lungs: Clear throughout to auscultation; no W/R/R. Heart: Regular rate and rhythm; no M/R/G. Abdomen: Soft, non-distended.  BS present.  Non-tender. Musculoskeletal: Symmetrical with no gross deformities  Skin: No lesions on visible extremities Extremities: No edema  Neurological: Alert oriented x 4, grossly non-focal Psychological:  Alert and cooperative. Normal mood and affect  ASSESSMENT AND PLAN: *Change in bowel habits with some mild constipation recently: Overall the constipation has resolved, but now she has sensation of incomplete emptying of stools.  Advised that she begin taking daily powder fiber supplement in the form of Benefiber 2 teaspoons mixed in 8 ounces of liquid daily.  She may want to consider adding a dose of MiraLAX daily to her regimen as well.  Will check TSH.  She will call back with any further issues. *Gas/belching: Can certainly continue omeprazole periodically if she desires, but also recommended trial of Gaviscon.  CC:  Panosh, Standley Brooking, MD

## 2021-01-13 NOTE — Patient Instructions (Addendum)
If you are age 62 or older, your body mass index should be between 23-30. Your Body mass index is 24.98 kg/m. If this is out of the aforementioned range listed, please consider follow up with your Primary Care Provider.  If you are age 6 or younger, your body mass index should be between 19-25. Your Body mass index is 24.98 kg/m. If this is out of the aformentioned range listed, please consider follow up with your Primary Care Provider.   Your provider has requested that you go to the basement level for lab work before leaving today. Press "B" on the elevator. The lab is located at the first door on the left as you exit the elevator.  Start Benefiber 2 teaspoons in 8 ounces of liquid daily.  Consider adding Miralax daily if needed as well.  Could try Gaviscon for gas.

## 2021-01-14 LAB — TSH: TSH: 0.94 u[IU]/mL (ref 0.35–4.50)

## 2021-01-15 NOTE — Progress Notes (Signed)
Addendum: Reviewed and agree with assessment and management plan. Pyrtle, Jay M, MD  

## 2021-03-08 ENCOUNTER — Other Ambulatory Visit: Payer: Self-pay

## 2021-03-08 NOTE — Progress Notes (Signed)
Chief Complaint  Patient presents with  . Annual Exam    HPI: Patient  Sharon Wells  60 y.o. comes in today for Preventive Health Care visit  GI symptoms evaluated felt to be benign and may be related to certain foods aggravated by cheese helped by the P family. Blood pressure is in control no new concerns.  Health Maintenance  Topic Date Due  . Hepatitis C Screening  Never done  . HIV Screening  Never done  . COVID-19 Vaccine (3 - Booster for Pfizer series) 03/25/2021 (Originally 01/31/2021)  . INFLUENZA VACCINE  09/01/2049 (Originally 06/14/2021)  . MAMMOGRAM  10/20/2022  . TETANUS/TDAP  05/08/2023  . PAP SMEAR-Modifier  09/01/2023  . COLONOSCOPY (Pts 45-35yrs Insurance coverage will need to be confirmed)  10/04/2023  . HPV VACCINES  Aged Out   Health Maintenance Review LIFESTYLE:  Exercise:  Walking  At least 3 d per week  6- 10 k  Per day Tobacco/ETS: no Alcohol: n Sugar beverages: better  ocass  Mostly water   Sleep:5-6  Drug use: no HH of 1 no pets  Work: 92 - 50   ROS:  GEN/ HEENT: No fever, significant weight changes sweats headaches vision problems hearing changes, CV/ PULM; No chest pain shortness of breath cough, syncope,edema  change in exercise tolerance. GI /GU: No adominal pain, vomiting, change in bowel habits. No blood in the stool. No significant GU symptoms. SKIN/HEME: ,no acute skin rashes suspicious lesions or bleeding. No lymphadenopathy, nodules, masses.  NEURO/ PSYCH:  No neurologic signs such as weakness numbness. No depression anxiety. IMM/ Allergy: No unusual infections.  Allergy .   REST of 12 system review negative except as per HPI   Past Medical History:  Diagnosis Date  . Chicken pox   . Fibroids    With history of anemia  . Hyperlipidemia     Past Surgical History:  Procedure Laterality Date  . COLONOSCOPY  2013   In [place]  . NO PAST SURGERIES      Family History  Problem Relation Age of Onset  . Sarcoidosis Mother         Deceased in her 17s  . Hypertension Sister   . Diabetes Sister   . Sarcoidosis Sister   . COPD Sister        Emphysema  . Pulmonary embolism Sister        Apparently am provoked  . Heart disease Maternal Grandmother   . Breast cancer Neg Hx   . Colon cancer Neg Hx   . Esophageal cancer Neg Hx   . Stomach cancer Neg Hx   . Rectal cancer Neg Hx     Social History   Socioeconomic History  . Marital status: Divorced    Spouse name: Not on file  . Number of children: Not on file  . Years of education: Not on file  . Highest education level: Not on file  Occupational History  . Not on file  Tobacco Use  . Smoking status: Never Smoker  . Smokeless tobacco: Never Used  Vaping Use  . Vaping Use: Never used  Substance and Sexual Activity  . Alcohol use: No  . Drug use: No  . Sexual activity: Not Currently    Comment: 1ST intercourse- 15, partners- 2,   Other Topics Concern  . 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   Social Determinants of Health   Financial Resource Strain: Not on file  Food Insecurity: Not on file  Transportation Needs: Not on file  Physical Activity: Not on file  Stress: Not on file  Social Connections: Not on file    Outpatient Medications Prior to Visit  Medication Sig Dispense Refill  . Ascorbic Acid (VITA-C PO) Take by mouth.    . cyanocobalamin 2000 MCG tablet Take 2,000 mcg by mouth daily.    . NON FORMULARY daily. [employer]: Multivitamin Complex with TRE-EN-EN Grain Concentrates     No facility-administered medications prior to visit.     EXAM:  BP 118/68 (BP Location: Right Arm, Patient Position: Sitting, Cuff Size: Normal)    Pulse 62   Temp 98.3 F (36.8 C) (Oral)   Ht 5\' 3"  (1.6 m)   Wt 138 lb 9.6 oz (62.9 kg)   LMP 12/15/2013   SpO2 99%   BMI 24.55 kg/m   Body mass index is 24.55 kg/m. Wt Readings from Last 3 Encounters:  03/09/21 138 lb 9.6 oz (62.9 kg)  01/13/21 141 lb (64 kg)  12/22/20 140 lb 3.2 oz (63.6 kg)    Physical Exam: Vital signs reviewed XAJ:OINO is a well-developed well-nourished alert cooperative    who appearsr stated age in no acute distress.  HEENT: normocephalic atraumatic , Eyes: PERRL EOM's full, conjunctiva clear, Nares: paten,t no deformity discharge or tenderness., Ears: no deformity EAC's clear TMs with normal landmarks. Mouth: Masked NECK: supple without masses, thyromegaly or bruits. CHEST/PULM:  Clear to auscultation and percussion breath sounds equal no wheeze , rales or rhonchi. No chest wall deformities or tenderness. Breast: normal by inspection . No dimpling, discharge, masses, tenderness or discharge . CV: PMI is nondisplaced, S1 S2 no gallops, murmurs, rubs. Peripheral pulses are full without delay.No JVD .  ABDOMEN: Bowel sounds normal nontender  No guard or rebound, no hepato splenomegal no CVA tenderness.  Extremtities:  No clubbing cyanosis or edema, no acute joint swelling or redness no focal atrophy NEURO:  Oriented x3, cranial nerves 3-12 appear to be intact, no obvious focal weakness,gait within normal limits no abnormal reflexes or asymmetrical SKIN: No acute rashes normal turgor, color, no bruising or petechiae. PSYCH: Oriented, good eye contact, no obvious depression anxiety, cognition and judgment appear normal. LN: no cervical axillary inguinal adenopathy  Lab Results  Component Value Date   WBC 3.9 (L) 12/22/2020   HGB 11.9 (L) 12/22/2020   HCT 35.9 (L) 12/22/2020   PLT 271.0 12/22/2020   GLUCOSE 88 12/22/2020   CHOL 241 (H) 03/03/2020   TRIG 137.0 03/03/2020   HDL 45.80 03/03/2020   LDLCALC 167 (H) 03/03/2020   ALT 9 12/22/2020   AST 13  12/22/2020   NA 140 12/22/2020   K 3.9 12/22/2020   CL 103 12/22/2020   CREATININE 0.65 12/22/2020   BUN 10 12/22/2020   CO2 31 12/22/2020   TSH 0.94 01/13/2021   HGBA1C 5.7 03/03/2020    BP Readings from Last 3 Encounters:  03/09/21 118/68  01/13/21 120/74  12/22/20 (!) 120/50    Lab plan reviewed with patient   ASSESSMENT AND PLAN:  Discussed the following assessment and plan:  ICD-10-CM   1. Visit for preventive health examination  Z00.00 Lipid panel    CBC with Differential/Platelet    Iron, TIBC and Ferritin Panel    Lipid panel    Iron, TIBC and Ferritin Panel    CBC with Differential/Platelet  2. Hyperlipidemia, unspecified hyperlipidemia type  E78.5 Lipid panel    CBC with Differential/Platelet    Iron, TIBC and Ferritin Panel    Lipid panel    Iron, TIBC and Ferritin Panel    CBC with Differential/Platelet  3. Mild anemia  D64.9 Lipid panel    CBC with Differential/Platelet    Iron, TIBC and Ferritin Panel    Lipid panel    Iron, TIBC and Ferritin Panel    CBC with Differential/Platelet  Has had mild anemia in the past.  Thyroid and chemistries have been normal repeat CBC fasting lipids iron panel today. If all is well yearly CPX. She is up-to-date on healthcare maintenance. GI symptoms are managed resolved and felt to be benign.cause Advise  Get more sleep as possible Return in about 1 year (around 03/09/2022) for depending on results.  Patient Care Team: Trishelle Devora, Standley Brooking, MD as PCP - General (Internal Medicine) Princess Bruins, MD as Consulting Physician (Obstetrics and Gynecology) Hilarie Fredrickson, Lajuan Lines, MD as Consulting Physician (Gastroenterology) Patient Instructions   Glad you are doing well.  Continue lifestyle intervention healthy eating and exercise .    Health Maintenance, Female Adopting a healthy lifestyle and getting preventive care are important in promoting health and wellness. Ask your health care provider about:  The right schedule  for you to have regular tests and exams.  Things you can do on your own to prevent diseases and keep yourself healthy. What should I know about diet, weight, and exercise? Eat a healthy diet  Eat a diet that includes plenty of vegetables, fruits, low-fat dairy products, and lean protein.  Do not eat a lot of foods that are high in solid fats, added sugars, or sodium.   Maintain a healthy weight Body mass index (BMI) is used to identify weight problems. It estimates body fat based on height and weight. Your health care provider can help determine your BMI and help you achieve or maintain a healthy weight. Get regular exercise Get regular exercise. This is one of the most important things you can do for your health. Most adults should:  Exercise for at least 150 minutes each week. The exercise should increase your heart rate and make you sweat (moderate-intensity exercise).  Do strengthening exercises at least twice a week. This is in addition to the moderate-intensity exercise.  Spend less time sitting. Even light physical activity can be beneficial. Watch cholesterol and blood lipids Have your blood tested for lipids and cholesterol at 62 years of age, then have this test every 5 years. Have your cholesterol levels checked more often if:  Your lipid or cholesterol levels are high.  You are older than 62 years of age.  You are at high risk for heart disease. What should I know about cancer screening? Depending on your health history and family history, you may need to have cancer screening at various ages. This may include screening for:  Breast cancer.  Cervical cancer.  Colorectal cancer.  Skin cancer.  Lung cancer. What should I know about heart disease, diabetes, and high blood pressure? Blood pressure and heart disease  High blood pressure causes heart disease and increases the risk of stroke. This is more likely to develop  in people who have high blood pressure  readings, are of African descent, or are overweight.  Have your blood pressure checked: ? Every 3-5 years if you are 26-64 years of age. ? Every year if you are 69 years old or older. Diabetes Have regular diabetes screenings. This checks your fasting blood sugar level. Have the screening done:  Once every three years after age 4 if you are at a normal weight and have a low risk for diabetes.  More often and at a younger age if you are overweight or have a high risk for diabetes. What should I know about preventing infection? Hepatitis B If you have a higher risk for hepatitis B, you should be screened for this virus. Talk with your health care provider to find out if you are at risk for hepatitis B infection. Hepatitis C Testing is recommended for:  Everyone born from [birth year] through 1965.  Anyone with known risk factors for hepatitis C. Sexually transmitted infections (STIs)  Get screened for STIs, including gonorrhea and chlamydia, if: ? You are sexually active and are younger than 62 years of age. ? You are older than 62 years of age and your health care provider tells you that you are at risk for this type of infection. ? Your sexual activity has changed since you were last screened, and you are at increased risk for chlamydia or gonorrhea. Ask your health care provider if you are at risk.  Ask your health care provider about whether you are at high risk for HIV. Your health care provider may recommend a prescription medicine to help prevent HIV infection. If you choose to take medicine to prevent HIV, you should first get tested for HIV. You should then be tested every 3 months for as long as you are taking the medicine. Pregnancy  If you are about to stop having your period (premenopausal) and you may become pregnant, seek counseling before you get pregnant.  Take 400 to 800 micrograms (mcg) of folic acid every day if you become pregnant.  Ask for birth control (contraception)  if you want to prevent pregnancy. Osteoporosis and menopause Osteoporosis is a disease in which the bones lose minerals and strength with aging. This can result in bone fractures. If you are 69 years old or older, or if you are at risk for osteoporosis and fractures, ask your health care provider if you should:  Be screened for bone loss.  Take a calcium or vitamin D supplement to lower your risk of fractures.  Be given hormone replacement therapy (HRT) to treat symptoms of menopause. Follow these instructions at home: Lifestyle  Do not use any products that contain nicotine or tobacco, such as cigarettes, e-cigarettes, and chewing tobacco. If you need help quitting, ask your health care provider.  Do not use street drugs.  Do not share needles.  Ask your health care provider for help if you need support or information about quitting drugs. Alcohol use  Do not drink alcohol if: ? Your health care provider tells you not to drink. ? You are pregnant, may be pregnant, or are planning to become pregnant.  If you drink alcohol: ? Limit how much you use to 0-1 drink a day. ? Limit intake if you are breastfeeding.  Be aware of how much alcohol is in your drink. In the [place] one drink equals one 12 oz bottle of beer (355 mL), one 5 oz glass of wine (148 mL), or one 1 oz glass of  hard liquor (44 mL). General instructions  Schedule regular health, dental, and eye exams.  Stay current with your vaccines.  Tell your health care provider if: ? You often feel depressed. ? You have ever been abused or do not feel safe at home. Summary  Adopting a healthy lifestyle and getting preventive care are important in promoting health and wellness.  Follow your health care provider's instructions about healthy diet, exercising, and getting tested or screened for diseases.  Follow your health care provider's instructions on monitoring your cholesterol and blood pressure. This information is not  intended to replace advice given to you by your health care provider. Make sure you discuss any questions you have with your health care provider. Document Revised: 10/24/2018 Document Reviewed: 10/24/2018 Elsevier Patient Education  2021 [place] K. Alizah Sills M.D.

## 2021-03-09 ENCOUNTER — Encounter: Payer: Self-pay | Admitting: Internal Medicine

## 2021-03-09 ENCOUNTER — Ambulatory Visit (INDEPENDENT_AMBULATORY_CARE_PROVIDER_SITE_OTHER): Payer: 59 | Admitting: Internal Medicine

## 2021-03-09 DIAGNOSIS — E785 Hyperlipidemia, unspecified: Secondary | ICD-10-CM

## 2021-03-09 DIAGNOSIS — D649 Anemia, unspecified: Secondary | ICD-10-CM

## 2021-03-09 DIAGNOSIS — Z0000 Encounter for general adult medical examination without abnormal findings: Secondary | ICD-10-CM | POA: Diagnosis not present

## 2021-03-09 DIAGNOSIS — I10 Essential (primary) hypertension: Secondary | ICD-10-CM

## 2021-03-09 LAB — LIPID PANEL
Cholesterol: 235 mg/dL — ABNORMAL HIGH (ref 0–200)
HDL: 47.1 mg/dL (ref >39.00)
LDL Cholesterol: 164 mg/dL — ABNORMAL HIGH (ref 0–99)
NonHDL: 187.99
Total CHOL/HDL Ratio: 5
Triglycerides: 122 mg/dL (ref 0.0–149.0)
VLDL: 24.4 mg/dL (ref 0.0–40.0)

## 2021-03-09 LAB — CBC WITH DIFFERENTIAL/PLATELET
Basophils Absolute: 0 10*3/uL (ref 0.0–0.1)
Basophils Relative: 1.6 % (ref 0.0–3.0)
Eosinophils Absolute: 0 10*3/uL (ref 0.0–0.7)
Eosinophils Relative: 1.1 % (ref 0.0–5.0)
HCT: 35.2 % — ABNORMAL LOW (ref 36.0–46.0)
Hemoglobin: 11.8 g/dL — ABNORMAL LOW (ref 12.0–15.0)
Lymphocytes Relative: 52.1 % — ABNORMAL HIGH (ref 12.0–46.0)
Lymphs Abs: 1.6 10*3/uL (ref 0.7–4.0)
MCHC: 33.5 g/dL (ref 30.0–36.0)
MCV: 85.1 fl (ref 78.0–100.0)
Monocytes Absolute: 0.2 10*3/uL (ref 0.1–1.0)
Monocytes Relative: 5.5 % (ref 3.0–12.0)
Neutro Abs: 1.2 10*3/uL — ABNORMAL LOW (ref 1.4–7.7)
Neutrophils Relative %: 39.7 % — ABNORMAL LOW (ref 43.0–77.0)
Platelets: 247 10*3/uL (ref 150.0–400.0)
RBC: 4.13 Mil/uL (ref 3.87–5.11)
RDW: 12.6 % (ref 11.5–15.5)
WBC: 3 10*3/uL — ABNORMAL LOW (ref 4.0–10.5)

## 2021-03-09 LAB — IBC + FERRITIN
Ferritin: 117.2 ng/mL (ref 10.0–291.0)
Iron: 78 ug/dL (ref 42–145)
Saturation Ratios: 25.9 % (ref 20.0–50.0)
Transferrin: 215 mg/dL (ref 212.0–360.0)

## 2021-03-09 NOTE — Addendum Note (Signed)
Addended by: Elmer Picker on: 03/09/2021 09:23 AM   Modules accepted: Orders

## 2021-03-09 NOTE — Addendum Note (Signed)
Addended by: Elmer Picker on: 03/09/2021 09:22 AM   Modules accepted: Orders

## 2021-03-09 NOTE — Patient Instructions (Signed)
Glad you are doing well.  Continue lifestyle intervention healthy eating and exercise .    Health Maintenance, Female Adopting a healthy lifestyle and getting preventive care are important in promoting health and wellness. Ask your health care provider about:  The right schedule for you to have regular tests and exams.  Things you can do on your own to prevent diseases and keep yourself healthy. What should I know about diet, weight, and exercise? Eat a healthy diet  Eat a diet that includes plenty of vegetables, fruits, low-fat dairy products, and lean protein.  Do not eat a lot of foods that are high in solid fats, added sugars, or sodium.   Maintain a healthy weight Body mass index (BMI) is used to identify weight problems. It estimates body fat based on height and weight. Your health care provider can help determine your BMI and help you achieve or maintain a healthy weight. Get regular exercise Get regular exercise. This is one of the most important things you can do for your health. Most adults should:  Exercise for at least 150 minutes each week. The exercise should increase your heart rate and make you sweat (moderate-intensity exercise).  Do strengthening exercises at least twice a week. This is in addition to the moderate-intensity exercise.  Spend less time sitting. Even light physical activity can be beneficial. Watch cholesterol and blood lipids Have your blood tested for lipids and cholesterol at 62 years of age, then have this test every 5 years. Have your cholesterol levels checked more often if:  Your lipid or cholesterol levels are high.  You are older than 62 years of age.  You are at high risk for heart disease. What should I know about cancer screening? Depending on your health history and family history, you may need to have cancer screening at various ages. This may include screening for:  Breast cancer.  Cervical cancer.  Colorectal cancer.  Skin  cancer.  Lung cancer. What should I know about heart disease, diabetes, and high blood pressure? Blood pressure and heart disease  High blood pressure causes heart disease and increases the risk of stroke. This is more likely to develop in people who have high blood pressure readings, are of African descent, or are overweight.  Have your blood pressure checked: ? Every 3-5 years if you are 3-73 years of age. ? Every year if you are 55 years old or older. Diabetes Have regular diabetes screenings. This checks your fasting blood sugar level. Have the screening done:  Once every three years after age 79 if you are at a normal weight and have a low risk for diabetes.  More often and at a younger age if you are overweight or have a high risk for diabetes. What should I know about preventing infection? Hepatitis B If you have a higher risk for hepatitis B, you should be screened for this virus. Talk with your health care provider to find out if you are at risk for hepatitis B infection. Hepatitis C Testing is recommended for:  Everyone born from [birth year] through 1965.  Anyone with known risk factors for hepatitis C. Sexually transmitted infections (STIs)  Get screened for STIs, including gonorrhea and chlamydia, if: ? You are sexually active and are younger than 62 years of age. ? You are older than 62 years of age and your health care provider tells you that you are at risk for this type of infection. ? Your sexual activity has changed since you were  last screened, and you are at increased risk for chlamydia or gonorrhea. Ask your health care provider if you are at risk.  Ask your health care provider about whether you are at high risk for HIV. Your health care provider may recommend a prescription medicine to help prevent HIV infection. If you choose to take medicine to prevent HIV, you should first get tested for HIV. You should then be tested every 3 months for as long as you are taking  the medicine. Pregnancy  If you are about to stop having your period (premenopausal) and you may become pregnant, seek counseling before you get pregnant.  Take 400 to 800 micrograms (mcg) of folic acid every day if you become pregnant.  Ask for birth control (contraception) if you want to prevent pregnancy. Osteoporosis and menopause Osteoporosis is a disease in which the bones lose minerals and strength with aging. This can result in bone fractures. If you are 83 years old or older, or if you are at risk for osteoporosis and fractures, ask your health care provider if you should:  Be screened for bone loss.  Take a calcium or vitamin D supplement to lower your risk of fractures.  Be given hormone replacement therapy (HRT) to treat symptoms of menopause. Follow these instructions at home: Lifestyle  Do not use any products that contain nicotine or tobacco, such as cigarettes, e-cigarettes, and chewing tobacco. If you need help quitting, ask your health care provider.  Do not use street drugs.  Do not share needles.  Ask your health care provider for help if you need support or information about quitting drugs. Alcohol use  Do not drink alcohol if: ? Your health care provider tells you not to drink. ? You are pregnant, may be pregnant, or are planning to become pregnant.  If you drink alcohol: ? Limit how much you use to 0-1 drink a day. ? Limit intake if you are breastfeeding.  Be aware of how much alcohol is in your drink. In the [place] one drink equals one 12 oz bottle of beer (355 mL), one 5 oz glass of wine (148 mL), or one 1 oz glass of hard liquor (44 mL). General instructions  Schedule regular health, dental, and eye exams.  Stay current with your vaccines.  Tell your health care provider if: ? You often feel depressed. ? You have ever been abused or do not feel safe at home. Summary  Adopting a healthy lifestyle and getting preventive care are important in  promoting health and wellness.  Follow your health care provider's instructions about healthy diet, exercising, and getting tested or screened for diseases.  Follow your health care provider's instructions on monitoring your cholesterol and blood pressure. This information is not intended to replace advice given to you by your health care provider. Make sure you discuss any questions you have with your health care provider. Document Revised: 10/24/2018 Document Reviewed: 10/24/2018 Elsevier Patient Education  2021 [employer].

## 2021-03-10 NOTE — Telephone Encounter (Signed)
Lipid panel labs have been placed

## 2021-03-10 NOTE — Telephone Encounter (Signed)
Hi Ms Barcia I agree that   Lowering cholesterol is beneficial to future health.   Ok to do intensive life style for now.  You may also want to get a coronary  Calcium ct scan ( out of pocket 99$) To help make decision. If 0 then lower risk and no statin ,  if high  Statins should be considered .  Let me know if you want me to order that self pay test.    Please arrange  Fasting  Lipid panel in 3-4 months        The 10-year ASCVD risk score Mikey Bussing [name]., et al., 2013) is: 5.7%   Values used to calculate the score:     Age: 61 years     Sex: Female     Is Non-Hispanic African American: Yes     Diabetic: No     Tobacco smoker: No     Systolic Blood Pressure: 076 mmHg     Is BP treated: No     HDL Cholesterol: 47.1 mg/dL     Total Cholesterol: 235 mg/dL

## 2021-03-11 DIAGNOSIS — Z9189 Other specified personal risk factors, not elsewhere classified: Secondary | ICD-10-CM

## 2021-03-11 DIAGNOSIS — E785 Hyperlipidemia, unspecified: Secondary | ICD-10-CM

## 2021-03-12 NOTE — Telephone Encounter (Signed)
Sharon Wells  Please order  Ct coronary calcium score  Dx CV risk assessment  And HLD( will be self pay )  Location can be anywhere  But I think she lives in [place] And can ask her preferred location

## 2021-03-15 ENCOUNTER — Ambulatory Visit (INDEPENDENT_AMBULATORY_CARE_PROVIDER_SITE_OTHER): Payer: 59 | Admitting: Podiatry

## 2021-03-15 ENCOUNTER — Other Ambulatory Visit: Payer: Self-pay

## 2021-03-15 DIAGNOSIS — M779 Enthesopathy, unspecified: Secondary | ICD-10-CM

## 2021-03-15 MED ORDER — TRIAMCINOLONE ACETONIDE 10 MG/ML IJ SUSP
10.0000 mg | Freq: Once | INTRAMUSCULAR | Status: AC
  Administered 2021-03-15: 10 mg

## 2021-03-17 NOTE — Progress Notes (Signed)
Subjective:   Patient ID: Sharon Wells, female   DOB: 60 y.o.   MRN: [id number]   HPI Patient presents with pain around the fifth digit right foot with inflammation fluid and states it sore when wearing shoe gear   ROS      Objective:  Physical Exam  Inflammatory capsulitis of the inner phalangeal joint digit 5 right with pain and keratotic tissue     Assessment:  Chronic hammertoe deformity with inflammatory capsulitis     Plan:  Sterile prep injected the inner phalangeal joint 1.5 mg Dexasone Kenalog 5 mg Xylocaine debrided lesion applied padding reappoint routine care or as needed and ultimately may require arthroplasty that she understands

## 2021-04-06 ENCOUNTER — Encounter: Payer: 59 | Admitting: Internal Medicine

## 2021-04-14 ENCOUNTER — Encounter: Payer: 59 | Admitting: Internal Medicine

## 2021-05-10 DIAGNOSIS — Z20822 Contact with and (suspected) exposure to covid-19: Secondary | ICD-10-CM | POA: Diagnosis not present

## 2021-05-13 ENCOUNTER — Ambulatory Visit
Admission: RE | Admit: 2021-05-13 | Discharge: 2021-05-13 | Disposition: A | Discharge status: Home / Self Care | Payer: No Typology Code available for payment source | Source: Ambulatory Visit | Attending: Internal Medicine | Admitting: Internal Medicine

## 2021-05-13 ENCOUNTER — Other Ambulatory Visit: Payer: Self-pay | Admitting: Internal Medicine

## 2021-05-13 DIAGNOSIS — E785 Hyperlipidemia, unspecified: Secondary | ICD-10-CM | POA: Diagnosis not present

## 2021-05-13 DIAGNOSIS — E7800 Pure hypercholesterolemia, unspecified: Secondary | ICD-10-CM

## 2021-05-14 DIAGNOSIS — R931 Abnormal findings on diagnostic imaging of heart and coronary circulation: Secondary | ICD-10-CM

## 2021-05-19 ENCOUNTER — Other Ambulatory Visit (HOSPITAL_COMMUNITY): Payer: Self-pay

## 2021-05-19 MED ORDER — ROSUVASTATIN CALCIUM 10 MG PO TABS
10.0000 mg | ORAL_TABLET | Freq: Every day | ORAL | 1 refills | Status: DC
  Filled 2021-05-19: qty 90, 90d supply, fill #0

## 2021-05-19 NOTE — Progress Notes (Signed)
See mychart messages

## 2021-05-19 NOTE — Telephone Encounter (Signed)
So I sent in some Crestor n but you certainly do not have to take it because we're talking about is reduction of risk and it is all statistics.  You could also consider seeing cardiology and have them go over any other testing think is appropriate.  The way they are reading the scales now is different than t in the past.  And I do not have the percentile tables.  Certainly healthy lifestyle is the way to go no matter what .  let me know if you want me to do a cardiology consult referral.  I am fine what ever you decide about taking medication.

## 2021-05-19 NOTE — Telephone Encounter (Signed)
So statin medicines have the best evidence for reduction in cardiovascular event so I would recommend a trial of that first.  If not tolerated we can adjust dosage or or change type.  I will send in rosuvastatin 10 mg   You can try taking it 3 times a week and then work your way up to daily.  If you have hesitancy about potential side effects.  Please arrange fasting lipid panel 3 months after beginning.  Medication.

## 2021-05-19 NOTE — Telephone Encounter (Signed)
Please do referral to Dr Harrington Challenger   DX elevated cac score   on problem list

## 2021-05-19 NOTE — Telephone Encounter (Signed)
Lab orders have been placed for the pt.

## 2021-05-20 NOTE — Telephone Encounter (Signed)
Referral to cardiology has been placed and the patient is aware.

## 2021-05-21 ENCOUNTER — Other Ambulatory Visit (HOSPITAL_COMMUNITY): Payer: Self-pay

## 2021-08-20 ENCOUNTER — Ambulatory Visit (INDEPENDENT_AMBULATORY_CARE_PROVIDER_SITE_OTHER): Payer: 59 | Admitting: Podiatry

## 2021-08-20 ENCOUNTER — Other Ambulatory Visit: Payer: Self-pay

## 2021-08-20 ENCOUNTER — Encounter: Payer: Self-pay | Admitting: Podiatry

## 2021-08-20 DIAGNOSIS — L84 Corns and callosities: Secondary | ICD-10-CM

## 2021-08-20 DIAGNOSIS — M2041 Other hammer toe(s) (acquired), right foot: Secondary | ICD-10-CM | POA: Diagnosis not present

## 2021-08-20 NOTE — Progress Notes (Signed)
Subjective:   Patient ID: Sharon Wells, female   DOB: 60 y.o.   MRN: [id number]   HPI Patient presents with digital deformity fifth digit right with keratotic lesion formation that is been chronic and has responded well to previous treatments but does have digital rotational and bone deformity   ROS      Objective:  Physical Exam  Hammertoe deformity digit 5 right with rotational component along with callus formation and inflammation with keratotic tissue formation     Assessment:  Chronic hammertoe deformity fifth digit right foot with pain and deformity     Plan:  H&P reviewed condition and went ahead today and recommended at 1 point future arthroplasty that I educated her on for hammertoe.  She wants to hold off currently debridement accomplished will be seen back as needed

## 2021-08-26 ENCOUNTER — Other Ambulatory Visit: Payer: Self-pay

## 2021-08-26 ENCOUNTER — Other Ambulatory Visit (INDEPENDENT_AMBULATORY_CARE_PROVIDER_SITE_OTHER): Payer: 59

## 2021-08-26 DIAGNOSIS — E785 Hyperlipidemia, unspecified: Secondary | ICD-10-CM | POA: Diagnosis not present

## 2021-08-26 LAB — LIPID PANEL
Cholesterol: 159 mg/dL (ref 0–200)
HDL: 52.6 mg/dL (ref >39.00)
LDL Cholesterol: 90 mg/dL (ref 0–99)
NonHDL: 106.89
Total CHOL/HDL Ratio: 3
Triglycerides: 85 mg/dL (ref 0.0–149.0)
VLDL: 17 mg/dL (ref 0.0–40.0)

## 2021-08-29 NOTE — Telephone Encounter (Signed)
I agree   ldl much better  If tolerating I suggest staying on highest dose Crestor tolerated to get ldl close to 70   Would have you get opinion from cardiology   if they agree.

## 2021-08-29 NOTE — Progress Notes (Signed)
Stay on crestor  see  my chart message

## 2021-08-30 ENCOUNTER — Other Ambulatory Visit (HOSPITAL_COMMUNITY): Payer: Self-pay

## 2021-08-30 ENCOUNTER — Encounter: Payer: Self-pay | Admitting: Internal Medicine

## 2021-08-30 ENCOUNTER — Other Ambulatory Visit: Payer: Self-pay

## 2021-08-30 ENCOUNTER — Ambulatory Visit (INDEPENDENT_AMBULATORY_CARE_PROVIDER_SITE_OTHER): Payer: 59 | Admitting: Internal Medicine

## 2021-08-30 VITALS — BP 120/70 | HR 72 | Ht 63.0 in | Wt 140.0 lb

## 2021-08-30 DIAGNOSIS — E7849 Other hyperlipidemia: Secondary | ICD-10-CM | POA: Diagnosis not present

## 2021-08-30 DIAGNOSIS — Z79899 Other long term (current) drug therapy: Secondary | ICD-10-CM | POA: Diagnosis not present

## 2021-08-30 MED ORDER — ROSUVASTATIN CALCIUM 20 MG PO TABS
20.0000 mg | ORAL_TABLET | Freq: Every day | ORAL | 3 refills | Status: DC
  Filled 2021-08-30: qty 90, 90d supply, fill #0
  Filled 2022-01-07: qty 90, 90d supply, fill #1
  Filled 2022-04-13: qty 90, 90d supply, fill #2
  Filled 2022-04-25 (×2): qty 90, 90d supply, fill #3

## 2021-08-30 MED ORDER — ROSUVASTATIN CALCIUM 20 MG PO TABS
20.0000 mg | ORAL_TABLET | Freq: Every day | ORAL | 3 refills | Status: DC
  Filled 2021-08-30: qty 90, 90d supply, fill #0

## 2021-08-30 NOTE — Progress Notes (Signed)
Cardiology Office Note   Date:  08/30/2021   ID:  Sharon Wells, DOB 28-Oct-1961, MRN [id number]  PCP:  Burnis Medin, MD  Cardiologist:   Dorris Carnes, MD   Patient referred for eval of Calcium Score     History of Present Illness: Sharon Wells is a 60 y.o. female who is followed by Sharon Wells    In JUne she had a Calcium Score CT that showed calcium score of  48 (in LAD) which places her in the 85th percentile The pt denies CP   Breathing is good   No edema   No dizziness  No palpitatoins        Current Meds  Medication Sig   Ascorbic Acid (VITA-C PO) Take by mouth.   cyanocobalamin 2000 MCG tablet Take 2,000 mcg by mouth daily.   NON FORMULARY daily. NeoLife: Multivitamin Complex with TRE-EN-EN Grain Concentrates   rosuvastatin (CRESTOR) 10 MG tablet Take 1 tablet (10 mg total) by mouth daily.     Allergies:   Eggs or egg-derived products   Past Medical History:  Diagnosis Date   Chicken pox    Fibroids    With history of anemia   Hyperlipidemia     Past Surgical History:  Procedure Laterality Date   COLONOSCOPY  2013   In [place]   NO PAST SURGERIES       Social History:  The patient  reports that she has never smoked. She has never used smokeless tobacco. She reports that she does not drink alcohol and does not use drugs.   Family History:  The patient's family history includes COPD in her sister; Diabetes in her sister; Heart disease in her maternal grandmother; Hypertension in her sister; Pulmonary embolism in her sister; Sarcoidosis in her mother and sister.    ROS:  Please see the history of present illness. All other systems are reviewed and  Negative to the above problem except as noted.    PHYSICAL EXAM: VS:  BP 120/70 (BP Location: Left Arm, Patient Position: Sitting, Cuff Size: Normal)   Pulse 72   Ht 5\' 3"  (1.6 m)   Wt 140 lb (63.5 kg)   LMP 12/15/2013   SpO2 99%   BMI 24.80 kg/m   GEN: Well nourished, well developed, in no acute  distress  HEENT: normal  Neck: no JVD, carotid bruits Cardiac: RRR; no murmurs,  No LE edema  Respiratory:  clear to auscultation bilaterally, normal work of breathing GI: soft, nontender, nondistended, + BS  No hepatomegaly  MS: no deformity Moving all extremities   Skin: warm and dry, no rash Neuro:  Strength and sensation are intact Psych: euthymic mood, full affect   EKG:  EKG is ordered today.  NSR   72 bpm     Lipid Panel    Component Value Date/Time   CHOL 159 08/26/2021 0756   TRIG 85.0 08/26/2021 0756   HDL 52.60 08/26/2021 0756   CHOLHDL 3 08/26/2021 0756   VLDL 17.0 08/26/2021 0756   LDLCALC 90 08/26/2021 0756      Wt Readings from Last 3 Encounters:  08/30/21 140 lb (63.5 kg)  03/09/21 138 lb 9.6 oz (62.9 kg)  01/13/21 141 lb (64 kg)      ASSESSMENT AND PLAN:  1  CAD   Recent Ca score pt with calcifications in LAD consistent with mild CAD   She is asymptoamtic    Now on statin   I would continue  but place for tighter control    I do not think she needs an ASA  2  HL  Lipids are improved from previous   LDL 90 now   I would increae Crestor to 20 mg   Follow up with lipomed in 8 wks with Lp(a) and Apo B  3   Diet   Discussed mediterranean diet and olive oil and low sugar (24 g/day added)    Follow up lab   Follow up in clinic in 1 year      Current medicines are reviewed at length with the patient today.  The patient does not have concerns regarding medicines.  Signed, Dorris Carnes, MD  08/30/2021 3:13 PM    [facility] [place], [place], [place]  [address] Phone: [phone]; Fax: [phone]

## 2021-08-30 NOTE — Patient Instructions (Signed)
Medication Instructions:  Increase your Crestor/ Rosuvastatin to 20 mg daily   *If you need a refill on your cardiac medications before your next appointment, please call your pharmacy*   Lab Work: Lipomed (fasting) and AST and ALT in 8 weeks   If you have labs (blood work) drawn today and your tests are completely normal, you will receive your results only by: [place] (if you have MyChart) OR A paper copy in the mail If you have any lab test that is abnormal or we need to change your treatment, we will call you to review the results.   Testing/Procedures: none   Follow-Up: At [facility], you and your health needs are our priority.  As part of our continuing mission to provide you with exceptional heart care, we have created designated Provider Care Teams.  These Care Teams include your primary Cardiologist (physician) and Advanced Practice Providers (APPs -  Physician Assistants and Nurse Practitioners) who all work together to provide you with the care you need, when you need it.  We recommend signing up for the patient portal called "MyChart".  Sign up information is provided on this After Visit Summary.  MyChart is used to connect with patients for Virtual Visits (Telemedicine).  Patients are able to view lab/test results, encounter notes, upcoming appointments, etc.  Non-urgent messages can be sent to your provider as well.   To learn more about what you can do with MyChart, go to [email or web address].    Your next appointment:   1 year(s)  The format for your next appointment:   In Person  Provider:   Dorris Carnes, MD   Other Instructions

## 2021-09-06 ENCOUNTER — Encounter: Payer: 59 | Admitting: Obstetrics & Gynecology

## 2021-09-09 ENCOUNTER — Encounter: Payer: Self-pay | Admitting: Obstetrics & Gynecology

## 2021-09-09 ENCOUNTER — Other Ambulatory Visit (HOSPITAL_COMMUNITY)
Admission: RE | Admit: 2021-09-09 | Discharge: 2021-09-09 | Disposition: A | Discharge status: Home / Self Care | Payer: 59 | Source: Ambulatory Visit | Attending: Obstetrics & Gynecology | Admitting: Obstetrics & Gynecology

## 2021-09-09 ENCOUNTER — Ambulatory Visit (INDEPENDENT_AMBULATORY_CARE_PROVIDER_SITE_OTHER): Payer: 59 | Admitting: Obstetrics & Gynecology

## 2021-09-09 ENCOUNTER — Other Ambulatory Visit: Payer: Self-pay

## 2021-09-09 VITALS — BP 120/70 | HR 68 | Ht 62.5 in | Wt 142.0 lb

## 2021-09-09 DIAGNOSIS — Z01419 Encounter for gynecological examination (general) (routine) without abnormal findings: Secondary | ICD-10-CM | POA: Insufficient documentation

## 2021-09-09 DIAGNOSIS — R87610 Atypical squamous cells of undetermined significance on cytologic smear of cervix (ASC-US): Secondary | ICD-10-CM | POA: Diagnosis not present

## 2021-09-09 DIAGNOSIS — D219 Benign neoplasm of connective and other soft tissue, unspecified: Secondary | ICD-10-CM | POA: Diagnosis not present

## 2021-09-09 DIAGNOSIS — Z780 Asymptomatic menopausal state: Secondary | ICD-10-CM

## 2021-09-09 NOTE — Progress Notes (Addendum)
Sharon Wells 05-18-1961 [id number]   History:    60 y.o. G3P3L3 Divorced.  Has grand-children.   RP:  Established patient presenting for annual gyn exam    HPI: Postmenopausal, well on no hormone replacement therapy.  No postmenopausal bleeding.  No pelvic pain.  Abstinent.  Urine and bowel movements normal.  Breasts normal.  Body mass index 25.56.  Good fitness, walking.  Low Cholesterol diet. Following hypercholesterolemia with Dr. Regis Bill.    Past medical history,surgical history, family history and social history were all reviewed and documented in the EPIC chart.  Gynecologic History Patient's last menstrual period was 12/15/2013.  Obstetric History OB History  Gravida Para Term Preterm AB Living  3 3 3     3   SAB IAB Ectopic Multiple Live Births               # Outcome Date GA Lbr Len/2nd Weight Sex Delivery Anes PTL Lv  3 Term           2 Term           1 Term              ROS: A ROS was performed and pertinent positives and negatives are included in the history.  GENERAL: No fevers or chills. HEENT: No change in vision, no earache, sore throat or sinus congestion. NECK: No pain or stiffness. CARDIOVASCULAR: No chest pain or pressure. No palpitations. PULMONARY: No shortness of breath, cough or wheeze. GASTROINTESTINAL: No abdominal pain, nausea, vomiting or diarrhea, melena or bright red blood per rectum. GENITOURINARY: No urinary frequency, urgency, hesitancy or dysuria. MUSCULOSKELETAL: No joint or muscle pain, no back pain, no recent trauma. DERMATOLOGIC: No rash, no itching, no lesions. ENDOCRINE: No polyuria, polydipsia, no heat or cold intolerance. No recent change in weight. HEMATOLOGICAL: No anemia or easy bruising or bleeding. NEUROLOGIC: No headache, seizures, numbness, tingling or weakness. PSYCHIATRIC: No depression, no loss of interest in normal activity or change in sleep pattern.     Exam:   BP 120/70   Pulse 68   Ht 5' 2.5" (1.588 m)   Wt 142 lb  (64.4 kg)   LMP 12/15/2013   BMI 25.56 kg/m   Body mass index is 25.56 kg/m.  General appearance : Well developed well nourished female. No acute distress HEENT: Eyes: no retinal hemorrhage or exudates,  Neck supple, trachea midline, no carotid bruits, no thyroidmegaly Lungs: Clear to auscultation, no rhonchi or wheezes, or rib retractions  Heart: Regular rate and rhythm, no murmurs or gallops Breast:Examined in sitting and supine position were symmetrical in appearance, no palpable masses or tenderness,  no skin retraction, no nipple inversion, no nipple discharge, no skin discoloration, no axillary or supraclavicular lymphadenopathy Abdomen: no palpable masses or tenderness, no rebound or guarding Extremities: no edema or skin discoloration or tenderness  Pelvic: Vulva: Normal             Vagina: No gross lesions or discharge  Cervix: No gross lesions or discharge.  Pap reflex done.  Uterus  RV, normal size, small fibroids felt, non-tender and mobile  Adnexa  Without masses or tenderness  Anus: Non thrombosed external hemorrhoids present   Assessment/Plan:  60 y.o. female for annual exam   1. Encounter for routine gynecological examination with Papanicolaou smear of cervix  Postmenopausal, well on no hormone replacement therapy.  No postmenopausal bleeding.  No pelvic pain.  Abstinent.  Pap reflex done.  Urine and bowel movements normal.  Breasts normal.  Screening mammo Neg 10/2020. Body mass index 25.56.  Good fitness, walking.  Low Cholesterol diet. Following hypercholesterolemia with Dr. Regis Bill. - Cytology - PAP( [employer])  2. ASCUS of cervix with negative high risk HPV - Cytology - PAP( [place])  3. Post-menopausal Postmenopausal, well on no hormone replacement therapy.  No postmenopausal bleeding.    4. Fibroids  Asymptomatic.  Princess Bruins MD, 8:14 AM 09/09/2021

## 2021-09-10 ENCOUNTER — Encounter: Payer: Self-pay | Admitting: Obstetrics & Gynecology

## 2021-09-10 LAB — CYTOLOGY - PAP: Diagnosis: NEGATIVE

## 2021-09-20 ENCOUNTER — Other Ambulatory Visit: Payer: Self-pay | Admitting: Obstetrics & Gynecology

## 2021-09-20 DIAGNOSIS — Z1231 Encounter for screening mammogram for malignant neoplasm of breast: Secondary | ICD-10-CM

## 2021-10-26 ENCOUNTER — Ambulatory Visit
Admission: RE | Admit: 2021-10-26 | Discharge: 2021-10-26 | Disposition: A | Discharge status: Home / Self Care | Payer: 59 | Source: Ambulatory Visit | Attending: Obstetrics & Gynecology | Admitting: Obstetrics & Gynecology

## 2021-10-26 DIAGNOSIS — Z1231 Encounter for screening mammogram for malignant neoplasm of breast: Secondary | ICD-10-CM | POA: Diagnosis not present

## 2021-10-27 ENCOUNTER — Other Ambulatory Visit: Payer: Self-pay | Admitting: Obstetrics & Gynecology

## 2021-10-27 DIAGNOSIS — R928 Other abnormal and inconclusive findings on diagnostic imaging of breast: Secondary | ICD-10-CM

## 2021-11-02 ENCOUNTER — Other Ambulatory Visit: Payer: 59

## 2021-11-02 ENCOUNTER — Other Ambulatory Visit: Payer: Self-pay

## 2021-11-02 DIAGNOSIS — Z79899 Other long term (current) drug therapy: Secondary | ICD-10-CM

## 2021-11-02 DIAGNOSIS — E7849 Other hyperlipidemia: Secondary | ICD-10-CM | POA: Diagnosis not present

## 2021-11-03 LAB — NMR, LIPOPROFILE
Cholesterol, Total: 165 mg/dL (ref 100–199)
HDL Particle Number: 39.2 umol/L (ref >=30.5)
HDL-C: 62 mg/dL (ref >39)
LDL Particle Number: 1084 nmol/L — ABNORMAL HIGH (ref <1000)
LDL Size: 20.9 nm (ref >20.5)
LDL-C (NIH Calc): 89 mg/dL (ref 0–99)
LP-IR Score: 45 (ref <=45)
Small LDL Particle Number: 404 nmol/L (ref <=527)
Triglycerides: 76 mg/dL (ref 0–149)

## 2021-11-03 LAB — AST: AST: 24 IU/L (ref 0–40)

## 2021-11-03 LAB — ALT: ALT: 20 IU/L (ref 0–32)

## 2021-11-03 LAB — LIPOPROTEIN A (LPA): Lipoprotein (a): 319.8 nmol/L — ABNORMAL HIGH (ref <75.0)

## 2021-11-03 LAB — APOLIPOPROTEIN B: Apolipoprotein B: 83 mg/dL (ref <90)

## 2021-11-17 ENCOUNTER — Encounter: Payer: Self-pay | Admitting: Internal Medicine

## 2021-11-17 ENCOUNTER — Telehealth: Payer: Self-pay | Admitting: Internal Medicine

## 2021-11-17 DIAGNOSIS — Z79899 Other long term (current) drug therapy: Secondary | ICD-10-CM

## 2021-11-17 DIAGNOSIS — E7849 Other hyperlipidemia: Secondary | ICD-10-CM

## 2021-11-17 NOTE — Telephone Encounter (Signed)
Patient states she received a call from [place], no message was left.

## 2021-11-17 NOTE — Telephone Encounter (Signed)
Stephani Police, RN  11/05/2021  5:04 PM EST Back to Top    Left a message for the pt to call back.     Pt with calcifications on coronary arteries   LDL should be at least below 70, could argue for close to 55 given elevated Lp(a).    Can add Zetia for now    Increase Crestor to 40    F/U lipomed alone in 8 wks     Goal to prevent progression   She has had a significnat repsonse to lipid lowering but studies show would be better lower    WIll review with pharmacy as well      Patient was in [place] and had dietary indiscretions . She wants to wait another month to repeat labs and make any med changes.    PLEASE RESPOND TO HER IM MyChart ONLY

## 2021-11-19 DIAGNOSIS — H5213 Myopia, bilateral: Secondary | ICD-10-CM | POA: Diagnosis not present

## 2021-11-23 NOTE — Telephone Encounter (Signed)
I do not think that is going to change the plan, but yes ok to repeat labs

## 2021-11-26 ENCOUNTER — Other Ambulatory Visit (HOSPITAL_COMMUNITY): Payer: Self-pay

## 2021-11-26 MED ORDER — CARESTART COVID-19 HOME TEST VI KIT
PACK | 0 refills | Status: DC
  Filled 2021-11-26: qty 4, 4d supply, fill #0

## 2021-11-26 NOTE — Telephone Encounter (Signed)
Per Dr. Harrington Challenger Lipomed in 8 weeks... 01/27/22.

## 2021-12-07 ENCOUNTER — Ambulatory Visit
Admission: RE | Admit: 2021-12-07 | Discharge: 2021-12-07 | Disposition: A | Discharge status: Home / Self Care | Payer: 59 | Source: Ambulatory Visit | Attending: Obstetrics & Gynecology | Admitting: Obstetrics & Gynecology

## 2021-12-07 DIAGNOSIS — N6012 Diffuse cystic mastopathy of left breast: Secondary | ICD-10-CM | POA: Diagnosis not present

## 2021-12-07 DIAGNOSIS — R928 Other abnormal and inconclusive findings on diagnostic imaging of breast: Secondary | ICD-10-CM

## 2021-12-07 DIAGNOSIS — R922 Inconclusive mammogram: Secondary | ICD-10-CM | POA: Diagnosis not present

## 2021-12-08 ENCOUNTER — Ambulatory Visit (INDEPENDENT_AMBULATORY_CARE_PROVIDER_SITE_OTHER): Payer: 59 | Admitting: Pharmacist

## 2021-12-08 ENCOUNTER — Other Ambulatory Visit: Payer: Self-pay

## 2021-12-08 DIAGNOSIS — E7841 Elevated Lipoprotein(a): Secondary | ICD-10-CM

## 2021-12-08 DIAGNOSIS — E782 Mixed hyperlipidemia: Secondary | ICD-10-CM | POA: Diagnosis not present

## 2021-12-08 DIAGNOSIS — R931 Abnormal findings on diagnostic imaging of heart and coronary circulation: Secondary | ICD-10-CM | POA: Diagnosis not present

## 2021-12-08 DIAGNOSIS — E785 Hyperlipidemia, unspecified: Secondary | ICD-10-CM | POA: Insufficient documentation

## 2021-12-08 NOTE — Progress Notes (Signed)
Patient ID: Sharon Wells                 DOB: 18-Nov-1960                    MRN: [id number]     HPI: Sharon Wells is a 61 y.o. female patient referred to lipid clinic by Dr. Harrington Challenger. PMH is significant for HLD. In 04/2021, coronary CT revealed a CAC score of 48 (85th percentile for age/sex) and mild calcification in LAD. Pt referred to cardiology for medical management. She increased rosuvastatin from 29m to 242mand ordered an advanced lipid panel with Lp(a) and Apo B. On 11/17/21, pt reported she was on vacation and did not follow her healthy diet, so wanted to push out repeat labs another 8 weeks prior to making medication change - increasing rosuvastatin to 4042maily and adding Zetia 32m77mily had been recommended.  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 She has been taking her rosuvastatin 20mg77mly without issue. She is currently working to increase physical activity and improve her diet since she [place] from vacation in [place]. Prefers to stay on 1 lipid medication if possible, but is amendable to adding another medication if diet/exercise does not bring her to goal.   Current Medications: rosuvastatin 20 mg daily Risk Factors: elevated calcium score, elevated Lp(a) LDL goal: <70 mg/dL  Diet: limiting meat, limiting sweets, eating more fish, Step 1 Program - plant steroids - sprinkles on salad or oatmeal; She is also doing the Step 1 Program - where she is adding plant sterols (?) to her foods.  Exercise: exercising daily  Family History: COPD in her sister; Diabetes in her sister; Heart disease in her maternal grandmother; Hypertension in her sister; Pulmonary embolism in her sister; Sarcoidosis in her mother (passed at 40) a64 sister. No heart attacks or strokes in immediate family.    Social History: NurseEngineer, miningain managMudloggerks downstairs in [employer] smoked, never smokeless tobacco, never alcohol, never  drugs  Labs:  11/02/21: Lp(a) 319.8, Apolipoprotein B 83, HDL 62, TGL 76, LDL-c 89, LDL-p 1004 (rosuvastatin 20mg 71my) 08/26/21: TC 159, TGL 85, HDL 52, LDL 90 (rosuvastatin 20mg d66m) 03/09/21: TC 235, TGL 122, HDL 47, LDL 164 (no LLT)  Past Medical History:  Diagnosis Date   Chicken pox    Fibroids    With history of anemia   Hyperlipidemia     Current Outpatient Medications on File Prior to Visit  Medication Sig Dispense Refill   Ascorbic Acid (VITA-C PO) Take by mouth.     COVID-19 At Home Antigen Test ([facility]) KIT Use as directed 4 each 0   cyanocobalamin 2000 MCG tablet Take 2,000 mcg by mouth daily.     NON FORMULARY daily. NeoLife: Multivitamin Complex with TRE-EN-EN Grain Concentrates     rosuvastatin (CRESTOR) 20 MG tablet Take 1 tablet (20 mg total) by mouth daily. 90 tablet 3   No current facility-administered medications on file prior to visit.    Allergies  Allergen Reactions   Influenza Vaccines Shortness Of Breath   Eggs Or Egg-Derived Products Nausea And Vomiting    Had  Ed visit after flu vaccine and seen bu allergist     Assessment/Plan:  1. Hyperlipidemia - LDL is elevated at 89 on rosuvastatin 20mg da64m above goal of <70 mg/dL given elevated calcium score and Lp(a). Discussed results of  her advanced lipid panel and calcium score. Pt would like to wait to make any medication changes until she has implemented two months of diet/exercise changes. F/u NMR lipid panel has been moved up a few weeks to the end of February. Encouraged patient to continue eating a heart healthy diet and increase physical activity. If LDL still elevated at re-check, plan to increase to rosuvastatin 40 mg daily or add Zetia 10 mg daily if needed.  Jethro Poling, PharmD Student assisted in this visit.  Megan E. Supple, PharmD, BCACP, [place] [address] [place], [place] [address] Phone: [phone]; Fax: [phone] 12/08/2021 4:14 PM

## 2021-12-08 NOTE — Patient Instructions (Signed)
It was nice to meet you today!  Your LDL-cholesterol goal is <70 mg/dL.  Your last LDL was 89 on 11/02/21.   Your lp(a) was elevated at 319.8.  We will have you come in for follow-up labs on February 28th, anytime after 7:30 AM fasting.   If needed, options include: increasing rosuvastatin to 40 mg daily or adding on Zetia 10 mg daily. For now, continue rosuvastatin 20 mg daily.  Continue a heart healthy diet and increasing physical activity.

## 2021-12-12 ENCOUNTER — Encounter: Payer: Self-pay | Admitting: Internal Medicine

## 2021-12-16 ENCOUNTER — Encounter: Payer: Self-pay | Admitting: Internal Medicine

## 2021-12-20 ENCOUNTER — Encounter: Payer: Self-pay | Admitting: Internal Medicine

## 2021-12-20 DIAGNOSIS — E785 Hyperlipidemia, unspecified: Secondary | ICD-10-CM

## 2021-12-21 ENCOUNTER — Other Ambulatory Visit: Payer: Self-pay

## 2021-12-21 ENCOUNTER — Other Ambulatory Visit: Payer: Self-pay | Admitting: Internal Medicine

## 2021-12-21 ENCOUNTER — Encounter (HOSPITAL_BASED_OUTPATIENT_CLINIC_OR_DEPARTMENT_OTHER): Payer: Self-pay | Admitting: *Deleted

## 2021-12-21 ENCOUNTER — Emergency Department (HOSPITAL_BASED_OUTPATIENT_CLINIC_OR_DEPARTMENT_OTHER)
Admission: EM | Admit: 2021-12-21 | Discharge: 2021-12-21 | Disposition: A | Discharge status: Home / Self Care | Payer: 59 | Attending: Emergency Medicine | Admitting: Emergency Medicine

## 2021-12-21 DIAGNOSIS — R002 Palpitations: Secondary | ICD-10-CM | POA: Diagnosis not present

## 2021-12-21 HISTORY — DX: Pure hypercholesterolemia, unspecified: E78.00

## 2021-12-21 LAB — BASIC METABOLIC PANEL
Anion gap: 10 (ref 5–15)
BUN: 12 mg/dL (ref 8–23)
CO2: 26 mmol/L (ref 22–32)
Calcium: 9.5 mg/dL (ref 8.9–10.3)
Chloride: 102 mmol/L (ref 98–111)
Creatinine, Ser: 0.57 mg/dL (ref 0.44–1.00)
GFR, Estimated: >60 mL/min (ref >60)
Glucose, Bld: 108 mg/dL — ABNORMAL HIGH (ref 70–99)
Potassium: 3.8 mmol/L (ref 3.5–5.1)
Sodium: 138 mmol/L (ref 135–145)

## 2021-12-21 LAB — URINALYSIS, MICROSCOPIC (REFLEX): RBC / HPF: NONE SEEN RBC/hpf (ref 0–5)

## 2021-12-21 LAB — CBC WITH DIFFERENTIAL/PLATELET
Abs Immature Granulocytes: 0.01 10*3/uL (ref 0.00–0.07)
Basophils Absolute: 0 10*3/uL (ref 0.0–0.1)
Basophils Relative: 1 %
Eosinophils Absolute: 0 10*3/uL (ref 0.0–0.5)
Eosinophils Relative: 0 %
HCT: 37.2 % (ref 36.0–46.0)
Hemoglobin: 12.1 g/dL (ref 12.0–15.0)
Immature Granulocytes: 0 %
Lymphocytes Relative: 31 %
Lymphs Abs: 1.7 10*3/uL (ref 0.7–4.0)
MCH: 28.4 pg (ref 26.0–34.0)
MCHC: 32.5 g/dL (ref 30.0–36.0)
MCV: 87.3 fL (ref 80.0–100.0)
Monocytes Absolute: 0.3 10*3/uL (ref 0.1–1.0)
Monocytes Relative: 6 %
Neutro Abs: 3.3 10*3/uL (ref 1.7–7.7)
Neutrophils Relative %: 62 %
Platelets: 258 10*3/uL (ref 150–400)
RBC: 4.26 MIL/uL (ref 3.87–5.11)
RDW: 12.2 % (ref 11.5–15.5)
WBC: 5.4 10*3/uL (ref 4.0–10.5)
nRBC: 0 % (ref 0.0–0.2)

## 2021-12-21 LAB — URINALYSIS, ROUTINE W REFLEX MICROSCOPIC
Bilirubin Urine: NEGATIVE
Glucose, UA: NEGATIVE mg/dL
Hgb urine dipstick: NEGATIVE
Ketones, ur: NEGATIVE mg/dL
Nitrite: NEGATIVE
Protein, ur: NEGATIVE mg/dL
Specific Gravity, Urine: 1.01 (ref 1.005–1.030)
pH: 6 (ref 5.0–8.0)

## 2021-12-21 LAB — MAGNESIUM: Magnesium: 2.2 mg/dL (ref 1.7–2.4)

## 2021-12-21 NOTE — Telephone Encounter (Signed)
Yes we can order these however because they made be sent out to reference lab such as [facility] I do not know if the cost will be better.

## 2021-12-21 NOTE — ED Triage Notes (Signed)
Palpitations during exercise tonight. EKG at triage.

## 2021-12-21 NOTE — ED Notes (Signed)
Pt hooked up to the monitor with  the 5 lead, BP cuff and pulse ox

## 2021-12-21 NOTE — ED Provider Notes (Signed)
[facility] Provider Note   CSN: [id number] Arrival date & time: 12/21/21  2006     History  Chief Complaint  Patient presents with   Palpitations    Sharon Wells is a 61 y.o. female.  She said she was doing her exercises at 7 PM tonight.  Began feeling her heart pounding fast and looked at her Apple Watch and said her heart rate was 160.  She says she does these exercises religiously and her heart rate never usually gets above 120.  It was not associated with any chest pain.  She was not dizzy or lightheaded.  She stopped doing her exercises and her heart rate was still going about 130 when she came to the emergency department.  Initial heart rate on presentation was 116.  No prior history of tachyarrhythmias.  No recent illness.  She feels she does not drink enough but no change from priors.  The history is provided by the patient.  Palpitations Palpitations quality:  Fast Onset quality:  Sudden Duration:  1 hour Progression:  Resolved Chronicity:  New Context: exercise   Relieved by:  Nothing Worsened by:  Nothing Ineffective treatments:  Deep relaxation Associated symptoms: no chest pain, no chest pressure, no dizziness, no nausea, no numbness, no shortness of breath, no vomiting and no weakness   Risk factors: no hx of atrial fibrillation       Home Medications Prior to Admission medications   Medication Sig Start Date End Date Taking? Authorizing Provider  Ascorbic Acid (VITA-C PO) Take by mouth.    [provider]  COVID-19 At Home Antigen Test [facility] COVID-19 HOME TEST) KIT Use as directed 11/26/21   Jefm Bryant, [facility]  cyanocobalamin 2000 MCG tablet Take 2,000 mcg by mouth daily.    [provider]  NON FORMULARY daily. [employer]Life: Multivitamin Complex with TRE-EN-EN Grain Concentrates    [provider]  rosuvastatin (CRESTOR) 20 MG tablet Take 1 tablet (20 mg total) by mouth daily. 08/30/21   Fay Records, MD      Allergies    Influenza vaccines and Eggs or egg-derived products    Review of Systems   Review of Systems  Constitutional:  Negative for fever.  HENT:  Negative for sore throat.   Eyes:  Negative for visual disturbance.  Respiratory:  Negative for shortness of breath.   Cardiovascular:  Positive for palpitations. Negative for chest pain.  Gastrointestinal:  Negative for abdominal pain, nausea and vomiting.  Genitourinary:  Negative for dysuria.  Musculoskeletal:  Negative for gait problem.  Skin:  Negative for rash.  Neurological:  Negative for dizziness, weakness and numbness.   Physical Exam Updated Vital Signs BP (!) 155/103 (BP Location: Left Arm)    Pulse (!) 116    Temp 98.2 F (36.8 C) (Oral)    Resp 18    Ht 5' 2.5" (1.588 m)    Wt 64.4 kg    LMP 12/15/2013    SpO2 100%    BMI 25.55 kg/m  Physical Exam Vitals and nursing note reviewed.  Constitutional:      General: She is not in acute distress.    Appearance: Normal appearance. She is well-developed.  HENT:     Head: Normocephalic and atraumatic.  Eyes:     Conjunctiva/sclera: Conjunctivae normal.  Cardiovascular:     Rate and Rhythm: Normal rate and regular rhythm.     Pulses: Normal pulses.     Heart sounds:  No murmur heard. Pulmonary:     Effort: Pulmonary effort is normal. No respiratory distress.     Breath sounds: Normal breath sounds.  Abdominal:     Palpations: Abdomen is soft.     Tenderness: There is no abdominal tenderness. There is no guarding or rebound.  Musculoskeletal:        General: No swelling. Normal range of motion.     Cervical back: Neck supple.  Skin:    General: Skin is warm and dry.     Capillary Refill: Capillary refill takes less than 2 seconds.  Neurological:     General: No focal deficit present.     Mental Status: She is alert.     Gait: Gait normal.    ED Results / Procedures / Treatments   Labs (all labs ordered are listed, but only abnormal results are  displayed) Labs Reviewed  URINALYSIS, ROUTINE W REFLEX MICROSCOPIC - Abnormal; Notable for the following components:      Result Value   Leukocytes,Ua MODERATE (*)    All other components within normal limits  BASIC METABOLIC PANEL - Abnormal; Notable for the following components:   Glucose, Bld 108 (*)    All other components within normal limits  URINALYSIS, MICROSCOPIC (REFLEX) - Abnormal; Notable for the following components:   Bacteria, UA RARE (*)    All other components within normal limits  CBC WITH DIFFERENTIAL/PLATELET  MAGNESIUM    EKG EKG Interpretation  Date/Time:  Tuesday December 21 2021 20:18:21 EST Ventricular Rate:  105 PR Interval:  192 QRS Duration: 74 QT Interval:  306 QTC Calculation: 404 R Axis:   76 Text Interpretation: Sinus tachycardia Right atrial enlargement Nonspecific ST and T wave abnormality Abnormal ECG When compared with ECG of 11-Apr-2019 08:51, No significant change since prior 5/20 Confirmed by Aletta Edouard [phone]) on 12/21/2021 8:23:21 PM  Radiology No results found.  Procedures Procedures    Medications Ordered in ED Medications - No data to display  ED Course/ Medical Decision Making/ A&P Clinical Course as of 12/22/21 0935  Tue Dec 21, 2021  2251 Patient's lab work has been unremarkable.  She does not have any urinary symptoms so will not treat that urine.  She has been on the heart monitor without any arrhythmias and current heart rate is in the 80s.  Recommended that she contact Dr. Harrington Challenger for follow-up and consideration for possibly a cardiac monitor. [MB]    Clinical Course User Index [MB] Hayden Rasmussen, MD                           Medical Decision Making Amount and/or Complexity of Data Reviewed Labs: ordered.  This patient complains of elevated heart rate; this involves an extensive number of treatment Options and is a complaint that carries with it a high risk of complications and Morbidity. The differential  includes palpitations, arrhythmia, A-fib, SVT, metabolic derangement, anemia  I ordered, reviewed and interpreted labs, which included CBC with normal white count normal, chemistries normal Kos, urinalysis with possible signs of infection, has no symptoms so not treated Additional history obtained from patient's friend who brought her in Previous records obtained and reviewed in epic no prior arrhythmias  After the interventions stated above, I reevaluated the patient and found patient to be hemodynamically stable with a pulse rate in the 80s.  Asymptomatic.  Reviewed work-up with her.  No indications for admission at this time.  She is comfortable  plan for outpatient follow-up with her provider to discuss cardiac monitoring.  Return instructions discussed          Final Clinical Impression(s) / ED Diagnoses Final diagnoses:  Heart palpitations    Rx / DC Orders ED Discharge Orders     None         Hayden Rasmussen, MD 12/22/21 [phone]

## 2021-12-21 NOTE — Discharge Instructions (Signed)
You were seen in the emergency department for evaluation of an elevated heart rate.  Your heart rate improved here with time.  Your lab work did not show any obvious explanation for your symptoms.  Please contact your cardiologist for follow-up.  Keep well-hydrated.  Return if any worsening or concerning symptoms

## 2021-12-22 ENCOUNTER — Telehealth: Payer: Self-pay | Admitting: Internal Medicine

## 2021-12-22 DIAGNOSIS — R000 Tachycardia, unspecified: Secondary | ICD-10-CM

## 2021-12-22 NOTE — Telephone Encounter (Signed)
I spoke with patient.  She is feeling fine today.  Heart rate back to normal. I told patient message would be sent to Dr Harrington Challenger for review and we would call back with recommendations once reviewed

## 2021-12-22 NOTE — Telephone Encounter (Signed)
STAT if HR is under 50 or over 120 (normal HR is 60-100 beats per minute)  What is your heart rate?  76  Do you have a log of your heart rate readings (document readings)?  No additional readings available  Do you have any other symptoms?   Patient states yesterday after work she started to do her routine 30-minute workout, but her HR quickly got up to the 160's. She states her HR is normally in the 80's at rest and around 130 while working out. She states she wasn't doing anything strenuous or out of the ordinary as she normally works out daily. She stopped working out and per smart watch her HR did not go down so she took her pulse manually and the readings from the watch were accurate. Patient then went to the ED. They did an EKG and took her HR while she was there and it got down to 105. ED advised her to follow up with cardiology for further advisement because she was asymptomatic. She states she is off work today so that she can relax, but she would like to know if Dr. Harrington Challenger has any recommendations. Please advise.

## 2021-12-23 ENCOUNTER — Ambulatory Visit (INDEPENDENT_AMBULATORY_CARE_PROVIDER_SITE_OTHER): Payer: 59

## 2021-12-23 DIAGNOSIS — R000 Tachycardia, unspecified: Secondary | ICD-10-CM

## 2021-12-23 NOTE — Progress Notes (Unsigned)
Enrolled patient for a 14 day Zio XT  monitor to be mailed to patients home  °

## 2021-12-23 NOTE — Telephone Encounter (Signed)
Set patient up for a 2 week Zio patch to monitor for arrhythmias

## 2021-12-23 NOTE — Telephone Encounter (Signed)
Spoke with the patient and advised her on recommendations from Dr. Harrington Challenger. Patient verbalized understanding. Orders have been placed.

## 2021-12-23 NOTE — Telephone Encounter (Signed)
Lab orders have been placed and pt aware.

## 2021-12-28 DIAGNOSIS — R000 Tachycardia, unspecified: Secondary | ICD-10-CM

## 2022-01-07 ENCOUNTER — Other Ambulatory Visit (HOSPITAL_COMMUNITY): Payer: Self-pay

## 2022-01-11 ENCOUNTER — Other Ambulatory Visit: Payer: 59

## 2022-01-18 DIAGNOSIS — R000 Tachycardia, unspecified: Secondary | ICD-10-CM | POA: Diagnosis not present

## 2022-01-27 ENCOUNTER — Other Ambulatory Visit: Payer: 59

## 2022-02-01 ENCOUNTER — Other Ambulatory Visit (INDEPENDENT_AMBULATORY_CARE_PROVIDER_SITE_OTHER): Payer: 59

## 2022-02-01 DIAGNOSIS — E785 Hyperlipidemia, unspecified: Secondary | ICD-10-CM | POA: Diagnosis not present

## 2022-02-01 LAB — LIPID PANEL
Cholesterol: 142 (ref 0–200)
HDL: 54 (ref 35–70)
LDL Cholesterol: 72
Triglycerides: 84 (ref 40–160)

## 2022-02-02 ENCOUNTER — Encounter: Payer: Self-pay | Admitting: Internal Medicine

## 2022-02-02 LAB — NMR, LIPOPROFILE
Cholesterol, Total: 142 mg/dL (ref 100–199)
HDL Particle Number: 34.9 umol/L (ref >=30.5)
HDL-C: 54 mg/dL (ref >39)
LDL Particle Number: 867 nmol/L (ref <1000)
LDL Size: 21 nm (ref >20.5)
LDL-C (NIH Calc): 72 mg/dL (ref 0–99)
LP-IR Score: 43 (ref <=45)
Small LDL Particle Number: 341 nmol/L (ref <=527)
Triglycerides: 84 mg/dL (ref 0–149)

## 2022-02-02 LAB — APOLIPOPROTEIN A-1: Apolipoprotein A-1: 150 mg/dL (ref 116–209)

## 2022-02-02 LAB — SPECIMEN STATUS REPORT

## 2022-02-02 LAB — APOLIPOPROTEIN B: Apolipoprotein B: 73 mg/dL (ref <90)

## 2022-02-02 NOTE — Progress Notes (Signed)
LDL particles improved ?Other lipoprotein's are favorable

## 2022-02-03 ENCOUNTER — Encounter: Payer: Self-pay | Admitting: Internal Medicine

## 2022-03-14 NOTE — Progress Notes (Signed)
? ?Chief Complaint  ?Patient presents with  ? Annual Exam  ? ? ?HPI: ?Patient  Sharon Wells  61 y.o. comes in today for Preventive Health Care visit  ?Had episode of tachycardia   ?Had zio patch .  And assessment Dr Harrington Challenger and  no concerning findigns ?Now on crestor 20 for elevated cacscore  85 percentile. ?Working on [employer] and activit . ? ?Bp is at goal  ? ? ? ?Health Maintenance  ?Topic Date Due  ? HIV Screening  Never done  ? COVID-19 Vaccine (3 - Pfizer risk series) 09/15/2022 (Originally 08/31/2020)  ? Hepatitis C Screening  09/15/2022 (Originally 10/19/1979)  ? Zoster Vaccines- Shingrix (1 of 2) 09/15/2022 (Originally 10/19/1978)  ? INFLUENZA VACCINE  09/01/2049 (Originally 06/14/2022)  ? TETANUS/TDAP  05/08/2023  ? COLONOSCOPY (Pts 45-37yr Insurance coverage will need to be confirmed)  10/04/2023  ? MAMMOGRAM  10/27/2023  ? PAP SMEAR-Modifier  09/09/2024  ? HPV VACCINES  Aged Out  ? ?Health Maintenance Review ?LIFESTYLE:  ?Exercise:   walking    3 d per week.  ?Tobacco/ETS: n ?Alcohol:  none ?Sugar beverages: no rare   ?Sleep:  zve 5-6 hours  ?Drug use: no ?HH of 1   no pets  ?Work: ft  50 + 60 hours  ? ? ? ?ROS:  ?REST of 12 system review negative except as per HPI ? ? ?Past Medical History:  ?Diagnosis Date  ? Chicken pox   ? Fibroids   ? With history of anemia  ? High cholesterol   ? Hyperlipidemia   ? ? ?Past Surgical History:  ?Procedure Laterality Date  ? [place]    ? ? ?Family History  ?Problem Relation Age of Onset  ? Sarcoidosis Mother   ?     Deceased in her 658s ? Hypertension Sister   ? Diabetes Sister   ? Sarcoidosis Sister   ? COPD Sister   ?     Emphysema  ? Pulmonary embolism Sister   ?     Apparently am provoked  ? Heart disease Maternal Grandmother   ? Breast cancer Neg Hx   ? Colon cancer Neg Hx   ? Esophageal cancer Neg Hx   ? Stomach cancer Neg Hx   ? Rectal cancer Neg Hx   ? ? ?Social History  ? ?Socioeconomic History  ? Marital  status: Divorced  ?  Spouse name: Not on file  ? Number of children: Not on file  ? Years of education: Not on file  ? Highest education level: Not on file  ?Occupational History  ? Not on file  ?Tobacco Use  ? Smoking status: Never  ? Smokeless tobacco: Never  ?Vaping Use  ? Vaping Use: Never used  ?Substance and Sexual Activity  ? Alcohol use: No  ? Drug use: No  ? Sexual activity: Not Currently  ?  Birth control/protection: Post-menopausal  ?  Comment: 1ST intercourse- 15, partners- 2,   ?Other Topics Concern  ? Not on file  ?Social History Narrative  ? 4-5 hours of sleep per night  ? Lives Alone separated   ? 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 Helps with grandkids also   ? Nurse practitioner/ masters degree  ? No pets  ? G3 P3  ? Negative TAD  ? Takes vitamins has dentures smoke alarm  and home wears seat belts.  ? ?Social Determinants of Health  ? ?Financial Resource Strain: Not on file  ?Food Insecurity: Not on file  ?Transportation Needs: Not on file  ?Physical Activity: Not on file  ?Stress: Not on file  ?Social Connections: Not on file  ? ? ?Outpatient Medications Prior to Visit  ?Medication Sig Dispense Refill  ? Ascorbic Acid (VITA-C PO) Take by mouth.    ? COVID-19 At Home Antigen Test [facility] COVID-19 HOME TEST) KIT Use as directed 4 each 0  ? cyanocobalamin 2000 MCG tablet Take 2,000 mcg by mouth daily.    ? NON FORMULARY daily. [employer]: Multivitamin Complex with TRE-EN-EN Grain Concentrates    ? rosuvastatin (CRESTOR) 20 MG tablet Take 1 tablet (20 mg total) by mouth daily. 90 tablet 3  ? ?No facility-administered medications prior to visit.  ? ? ? ?EXAM: ? ?BP 120/70 (BP Location: Left Arm, Patient Position: Sitting, Cuff Size: Normal)   Pulse 68   Temp 98.5 ?F (36.9 ?C) (Oral)   Ht 5' 2.75" (1.594 m)   Wt 141 lb 9.6 oz (64.2 kg)   LMP 12/15/2013   SpO2 99%   BMI 25.28 kg/m?  ? ?Body mass index is 25.28 kg/m?. ?Wt Readings from Last 3  Encounters:  ?03/15/22 141 lb 9.6 oz (64.2 kg)  ?12/21/21 141 lb 15.6 oz (64.4 kg)  ?09/09/21 142 lb (64.4 kg)  ? ? ?Physical Exam: ?Vital signs reviewed ?NOM:VEHM is a well-developed well-nourished alert cooperative    who appearsr stated age in no acute distress.  ?HEENT: normocephalic atraumatic , Eyes: PERRL EOM's full, conjunctiva clear,glassess Nares: paten,t no deformity discharge or tenderness., Ears: no deformity EAC's clear TMs with normal landmarks. Mouth: clear  ?NECK: supple without masses, thyromegaly or bruits. ?CHEST/PULM:  Clear to auscultation and percussion breath sounds equal no wheeze , rales or rhonchi. No chest wall deformities or tenderness. ?Breast: normal by inspection .lumpiness superior but no masses  No dimpling, discharge, masses, tenderness or discharge . ?CV: PMI is nondisplaced, S1 S2 no gallops, murmurs, rubs. Peripheral pulses are full without delay.No JVD .  ?ABDOMEN: Bowel sounds normal nontender  No guard or rebound, no hepato splenomegal no CVA tenderness.  No hernia. ?Extremtities:  No clubbing cyanosis or edema, no acute joint swelling or redness no focal atrophy ?NEURO:  Oriented x3, cranial nerves 3-12 appear to be intact, no obvious focal weakness,gait within normal limits no abnormal reflexes or asymmetrical ?SKIN: No acute rashes normal turgor, color, no bruising or petechiae. ?PSYCH: Oriented, good eye contact, no obvious depression anxiety, cognition and judgment appear normal. ?LN: no cervical axillary i adenopathy ? ?Lab Results  ?Component Value Date  ? WBC 5.4 12/21/2021  ? HGB 12.1 12/21/2021  ? HCT 37.2 12/21/2021  ? PLT 258 12/21/2021  ? GLUCOSE 108 (H) 12/21/2021  ? CHOL 142 02/01/2022  ? TRIG 84 02/01/2022  ? HDL 54 02/01/2022  ? LDLCALC 72 02/01/2022  ? ALT 20 11/02/2021  ? AST 24 11/02/2021  ? NA 138 12/21/2021  ? K 3.8 12/21/2021  ? CL 102 12/21/2021  ? CREATININE 0.57 12/21/2021  ? BUN 12 12/21/2021  ? CO2 26 12/21/2021  ? TSH 0.94 01/13/2021  ? HGBA1C 5.7  03/03/2020  ? ? ?BP Readings from Last 3 Encounters:  ?03/15/22 120/70  ?12/21/21 118/75  ?09/09/21 120/70  ? ?IMPRESSION: ?1. Coronary calcium score of 48 is at the 85th percentile for the ?patient's age, sex and race. ?2. Small fissural and subpleural  lung nodules as described above are ?most likely postinflammatory. No follow-up needed if patient is ?low-risk (and has no known or suspected primary neoplasm). ?Non-contrast chest CT can be considered in 12 months if patient is ?high-risk. This recommendation follows the consensus statement: ?Guidelines for Management of Incidental Pulmonary Nodules Detected ?on CT Images: From the Fleischner Society 2017; Radiology 2017; ?284:228-243. ?  ?  ?Electronically Signed ?  By: Aletta Edouard M.D. ?  On: 05/14/2021 10:35 ?Lab results reviewed with patient  ? ?ASSESSMENT AND PLAN: ? ?Discussed the following assessment and plan: ? ?  ICD-10-CM   ?1. Visit for preventive health examination  Z00.00   ?  ?2. Elevated coronary artery calcium score  R93.1   ? ldl 72 on crestor 20  ?  ?3. Mixed hyperlipidemia  E78.2   ?  ?4. Medication management  Z79.899   ?  ?Continue meds and lsi  as ongoing.  ?Yearly  cpx and labs as indicated  ?Return in about 1 year (around 03/16/2023) for preventive /cpx. ? ?Patient Care Team: ?Ellarae Nevitt, Standley Brooking, MD as PCP - General (Internal Medicine) ?Princess Bruins, MD as Consulting Physician (Obstetrics and Gynecology) ?Pyrtle, Lajuan Lines, MD as Consulting Physician (Gastroenterology) ?Patient Instructions  ?Good to see you today. ?Continue lifestyle intervention healthy eating and exercise .  ? ?Yearly  cpx and labs  can call ahead  ?Due for tdap next year  ? ?Shingric any time? ? ?Standley Brooking. Slate Debroux M.D. ? ?

## 2022-03-15 ENCOUNTER — Ambulatory Visit (INDEPENDENT_AMBULATORY_CARE_PROVIDER_SITE_OTHER): Payer: 59 | Admitting: Internal Medicine

## 2022-03-15 ENCOUNTER — Encounter: Payer: Self-pay | Admitting: Internal Medicine

## 2022-03-15 VITALS — BP 120/70 | HR 68 | Temp 98.5°F | Ht 62.75 in | Wt 141.6 lb

## 2022-03-15 DIAGNOSIS — Z0000 Encounter for general adult medical examination without abnormal findings: Secondary | ICD-10-CM

## 2022-03-15 DIAGNOSIS — R931 Abnormal findings on diagnostic imaging of heart and coronary circulation: Secondary | ICD-10-CM | POA: Diagnosis not present

## 2022-03-15 DIAGNOSIS — E782 Mixed hyperlipidemia: Secondary | ICD-10-CM

## 2022-03-15 DIAGNOSIS — Z79899 Other long term (current) drug therapy: Secondary | ICD-10-CM | POA: Diagnosis not present

## 2022-03-15 NOTE — Patient Instructions (Addendum)
Good to see you today. ?Continue lifestyle intervention healthy eating and exercise .  ? ?Yearly  cpx and labs  can call ahead  ?Due for tdap next year  ? ?Shingric any time? ?

## 2022-04-13 ENCOUNTER — Other Ambulatory Visit (HOSPITAL_COMMUNITY): Payer: Self-pay

## 2022-04-14 ENCOUNTER — Other Ambulatory Visit (HOSPITAL_COMMUNITY): Payer: Self-pay

## 2022-04-25 ENCOUNTER — Other Ambulatory Visit (HOSPITAL_COMMUNITY): Payer: Self-pay

## 2022-05-06 ENCOUNTER — Ambulatory Visit (INDEPENDENT_AMBULATORY_CARE_PROVIDER_SITE_OTHER): Payer: 59 | Admitting: Internal Medicine

## 2022-05-06 ENCOUNTER — Encounter: Payer: Self-pay | Admitting: Internal Medicine

## 2022-05-06 ENCOUNTER — Other Ambulatory Visit (HOSPITAL_COMMUNITY): Payer: Self-pay

## 2022-05-06 VITALS — BP 132/62 | HR 87 | Ht 63.0 in | Wt 142.0 lb

## 2022-05-06 DIAGNOSIS — R931 Abnormal findings on diagnostic imaging of heart and coronary circulation: Secondary | ICD-10-CM

## 2022-05-06 DIAGNOSIS — E782 Mixed hyperlipidemia: Secondary | ICD-10-CM | POA: Diagnosis not present

## 2022-05-06 DIAGNOSIS — E7841 Elevated Lipoprotein(a): Secondary | ICD-10-CM | POA: Diagnosis not present

## 2022-05-06 MED ORDER — ROSUVASTATIN CALCIUM 40 MG PO TABS
40.0000 mg | ORAL_TABLET | Freq: Every day | ORAL | 3 refills | Status: DC
  Filled 2022-05-06: qty 90, 90d supply, fill #0

## 2022-05-18 ENCOUNTER — Other Ambulatory Visit (HOSPITAL_COMMUNITY): Payer: Self-pay

## 2022-05-20 ENCOUNTER — Other Ambulatory Visit (HOSPITAL_BASED_OUTPATIENT_CLINIC_OR_DEPARTMENT_OTHER): Payer: Self-pay

## 2022-06-16 ENCOUNTER — Encounter: Payer: Self-pay | Admitting: Internal Medicine

## 2022-06-16 DIAGNOSIS — M25511 Pain in right shoulder: Secondary | ICD-10-CM

## 2022-06-20 NOTE — Telephone Encounter (Signed)
Ok please order a r shoulder x ray  at her preferred location  ( here  or [facility] is [facility])

## 2022-06-22 ENCOUNTER — Other Ambulatory Visit: Payer: 59

## 2022-06-22 ENCOUNTER — Ambulatory Visit (INDEPENDENT_AMBULATORY_CARE_PROVIDER_SITE_OTHER): Payer: 59

## 2022-06-22 DIAGNOSIS — M25511 Pain in right shoulder: Secondary | ICD-10-CM | POA: Diagnosis not present

## 2022-06-22 NOTE — Progress Notes (Signed)
Should x ray normal   May want to see sports med  if ongoing

## 2022-06-22 NOTE — Progress Notes (Signed)
Sure  please do PT referral

## 2022-06-23 ENCOUNTER — Other Ambulatory Visit: Payer: Self-pay | Admitting: *Deleted

## 2022-06-23 DIAGNOSIS — M25511 Pain in right shoulder: Secondary | ICD-10-CM

## 2022-07-12 ENCOUNTER — Ambulatory Visit: Payer: 59 | Attending: Internal Medicine | Admitting: Physical Therapy

## 2022-07-12 ENCOUNTER — Encounter: Payer: Self-pay | Admitting: Physical Therapy

## 2022-07-12 ENCOUNTER — Other Ambulatory Visit: Payer: Self-pay

## 2022-07-12 DIAGNOSIS — M25511 Pain in right shoulder: Secondary | ICD-10-CM | POA: Insufficient documentation

## 2022-07-12 DIAGNOSIS — M6281 Muscle weakness (generalized): Secondary | ICD-10-CM | POA: Insufficient documentation

## 2022-07-12 DIAGNOSIS — G8929 Other chronic pain: Secondary | ICD-10-CM | POA: Insufficient documentation

## 2022-07-12 NOTE — Patient Instructions (Signed)
Access Code: [email or web address] URL: [email or web address] Date: 07/12/2022 Prepared by: Hilda Blades  Exercises - Sidelying Shoulder Flexion 15 Degrees  - 1 x daily - 3 sets - 10 reps - Sidelying Shoulder External Rotation  - 1 x daily - 3 sets - 10 reps - Standing Row with Anchored Resistance  - 1 x daily - 3 sets - 10 reps

## 2022-07-12 NOTE — Therapy (Signed)
OUTPATIENT PHYSICAL THERAPY EVALUATION   Patient Name: Sharon Wells MRN: [id number] DOB:Mar 17, 1961, 61 y.o., female Today's Date: 07/12/2022   PT End of Session - 07/12/22 1629     Visit Number 1    Number of Visits 9    Date for PT Re-Evaluation 09/06/22    Authorization Type [facility]    PT Start Time 4599    PT Stop Time 1700    PT Time Calculation (min) 43 min    Activity Tolerance Patient tolerated treatment well    Behavior During Therapy WFL for tasks assessed/performed             Past Medical History:  Diagnosis Date   Chicken pox    Fibroids    With history of anemia   High cholesterol    Hyperlipidemia    Past Surgical History:  Procedure Laterality Date   COLONOSCOPY  2013   In [place]   NO PAST SURGERIES     Patient Active Problem List   Diagnosis Date Noted   Hyperlipidemia 12/08/2021   Elevated coronary artery calcium score 12/08/2021   Elevated Lp(a) 12/08/2021   Change in bowel habits 01/13/2021   Flatulence 01/13/2021   Belching 01/13/2021   Essential hypertension 05/02/2017   Adverse reaction to vaccine 09/03/2014   Elevated blood pressure 09/03/2014   Other specified anemias 07/16/2014   Preventive measure 07/16/2014    PCP: Burnis Medin, MD  REFERRING PROVIDER: Burnis Medin, MD  REFERRING DIAG: Right shoulder pain, unspecified chronicity  THERAPY DIAG:  Chronic right shoulder pain  Muscle weakness (generalized)  Rationale for Evaluation and Treatment Rehabilitation  ONSET DATE: February 2023   SUBJECTIVE:           SUBJECTIVE STATEMENT: Patient reports in February she was walking and fell into her bathroom cabinet, hitting her right shoulder. She had some achiness so used creams to help. The pain would come and go, and now she has pain when going out to the side and with activities such as cleaning her bathtub or when putting on her bra. She reports that pain is worse toward the end of the day. She had an x-ray that  was negative. Patient is right handed.   PERTINENT HISTORY: None  PAIN:  Are you having pain? Yes:  NPRS scale: 0/10 (5/10 pain when it occurs) Pain location: Right shoulder Pain description: Sharp Aggravating factors: Moving arm out to the side Relieving factors: Medication, rest  PRECAUTIONS: None  WEIGHT BEARING RESTRICTIONS No  FALLS:  Has patient fallen in last 6 months? Yes. Number of falls 1  LIVING ENVIRONMENT: Lives with: lives alone Lives in: House/apartment Stairs: Yes: Internal: 1 flight steps; on right going up and External: 1 steps; none  OCCUPATION: Nurse practitioner at [employer]  PLOF: Independent  PATIENT GOALS: Pain free with activity, full range of motion   OBJECTIVE:  PATIENT SURVEYS:  FOTO ***  COGNITION: Overall cognitive status: Within functional limits for tasks assessed     SENSATION: WFL  POSTURE: Mildly rounded shoulder and forward head posture  UPPER EXTREMITY ROM:   Active ROM Right eval Left eval  Shoulder flexion 155 170  Shoulder extension 35 45  Shoulder abduction 160 180  Shoulder internal rotation L5 T7  Shoulder external rotation 65 (T2) 75 (T4)  Patient reports twinge with all end range motions  UPPER EXTREMITY MMT:  MMT Right eval Left eval  Shoulder flexion / scaption 5 /5 5 / 5  Shoulder extension  5 5  Shoulder abduction 4 5  Shoulder internal rotation 5 5  Shoulder external rotation 4 5  Middle trapezius    Lower trapezius    Pain with abduction  SHOULDER SPECIAL TESTS:  Impingement tests: {shoulder impingement test:25231:a}  SLAP lesions: {SLAP lesions:25232}  Instability tests: {shoulder instability test:25233}  Rotator cuff assessment: {rotator cuff assessment:25234}  Biceps assessment: {biceps assessment:25235}  JOINT MOBILITY TESTING:  ***  PALPATION:  Non tender to plapation    TODAY'S TREATMENT:  ***   PATIENT EDUCATION: Education details: Exam findings, POC, HEP Person educated:  Patient Education method: Explanation, Demonstration, Tactile cues, Verbal cues, and Handouts Education comprehension: verbalized understanding, returned demonstration, verbal cues required, tactile cues required, and needs further education  HOME EXERCISE PROGRAM: Access Code: [email or web address]   ASSESSMENT: CLINICAL IMPRESSION: Patient is a 61 y.o. female who was seen today for physical therapy evaluation and treatment for chronic right shoulder pain.    OBJECTIVE IMPAIRMENTS {opptimpairments:25111}.   ACTIVITY LIMITATIONS {activitylimitations:27494}  PARTICIPATION LIMITATIONS: {participationrestrictions:25113}  PERSONAL FACTORS {Personal factors:25162} are also affecting patient's functional outcome.   REHAB POTENTIAL: Good  CLINICAL DECISION MAKING: Stable/uncomplicated  EVALUATION COMPLEXITY: Low   GOALS: Goals reviewed with patient? Yes  SHORT TERM GOALS: Target date: 08/09/2022   Patient will be I with initial HEP in order to progress with therapy. Baseline: HEP assessed at eval Goal status: INITIAL  2.  PT will review FOTO with patient by 3rd visit in order to understand expected progress and outcome with therapy. Baseline: FOTO assessed at eval Goal status: INITIAL  3.  *** Baseline:  Goal status: INITIAL  LONG TERM GOALS: Target date: 09/06/2022    Patient will be I with final HEP to maintain progress from PT. Baseline: HEP assessed at eval Goal status: INITIAL  2.  Patient will report >/= ***% status on FOTO to indicate improved functional ability. Baseline: % Goal status: INITIAL  3.  *** Baseline:  Goal status: INITIAL  4.  *** Baseline:  Goal status: INITIAL   PLAN: PT FREQUENCY: 1x/week  PT DURATION: 8 weeks  PLANNED INTERVENTIONS: Therapeutic exercises, Therapeutic activity, Neuromuscular re-education, Balance training, Gait training, Patient/Family education, Self Care, Joint mobilization, Joint manipulation, Aquatic Therapy, Dry Needling,  Electrical stimulation, Spinal manipulation, Spinal mobilization, Cryotherapy, Moist heat, Taping, Ionotophoresis '4mg'$ /ml Dexamethasone, Manual therapy, and Re-evaluation  PLAN FOR NEXT SESSION: Review HEP and progress PRN, ***   Hilda Blades, PT, DPT, LAT, ATC 07/12/22  4:29 PM Phone: [phone] Fax: [phone]

## 2022-07-15 ENCOUNTER — Encounter: Payer: Self-pay | Admitting: Internal Medicine

## 2022-07-15 DIAGNOSIS — R931 Abnormal findings on diagnostic imaging of heart and coronary circulation: Secondary | ICD-10-CM

## 2022-07-15 DIAGNOSIS — Z79899 Other long term (current) drug therapy: Secondary | ICD-10-CM

## 2022-07-15 DIAGNOSIS — E785 Hyperlipidemia, unspecified: Secondary | ICD-10-CM

## 2022-07-19 NOTE — Telephone Encounter (Signed)
I will place orders for tests per Dr Harrington Challenger at [place] lab.  Call to make lab appt for day requested

## 2022-07-21 ENCOUNTER — Other Ambulatory Visit (INDEPENDENT_AMBULATORY_CARE_PROVIDER_SITE_OTHER): Payer: 59

## 2022-07-21 DIAGNOSIS — E785 Hyperlipidemia, unspecified: Secondary | ICD-10-CM

## 2022-07-21 DIAGNOSIS — R931 Abnormal findings on diagnostic imaging of heart and coronary circulation: Secondary | ICD-10-CM | POA: Diagnosis not present

## 2022-07-21 DIAGNOSIS — Z79899 Other long term (current) drug therapy: Secondary | ICD-10-CM

## 2022-07-21 NOTE — Therapy (Signed)
OUTPATIENT PHYSICAL THERAPY TREATMENT NOTE   Patient Name: Sharon Wells MRN: [id number] DOB:1960-12-30, 61 y.o., female Today's Date: 07/22/2022  PCP: Burnis Medin, MD   REFERRING PROVIDER: Burnis Medin, MD   END OF SESSION:   PT End of Session - 07/22/22 0752     Visit Number 2    Number of Visits 9    Date for PT Re-Evaluation 09/06/22    Authorization Type [facility] U[facility]    Authorization Time Period FOTO by 6th    Progress Note Due on Visit 10    PT Start Time 0751    PT Stop Time 0829    PT Time Calculation (min) 38 min    Activity Tolerance Patient tolerated treatment well    Behavior During Therapy [facility] for tasks assessed/performed             Past Medical History:  Diagnosis Date   Chicken pox    Fibroids    With history of anemia   High cholesterol    Hyperlipidemia    Past Surgical History:  Procedure Laterality Date   COLONOSCOPY  2013   In [place]   NO PAST SURGERIES     Patient Active Problem List   Diagnosis Date Noted   Hyperlipidemia 12/08/2021   Elevated coronary artery calcium score 12/08/2021   Elevated Lp(a) 12/08/2021   Change in bowel habits 01/13/2021   Flatulence 01/13/2021   Belching 01/13/2021   Essential hypertension 05/02/2017   Adverse reaction to vaccine 09/03/2014   Elevated blood pressure 09/03/2014   Other specified anemias 07/16/2014   Preventive measure 07/16/2014    REFERRING DIAG: Right shoulder pain, unspecified chronicity  THERAPY DIAG:  Chronic right shoulder pain  Muscle weakness (generalized)  Rationale for Evaluation and Treatment Rehabilitation  PERTINENT HISTORY: None  PRECAUTIONS: None  SUBJECTIVE: Patient reports she feels good during the day but continues to have pain at night. She feels the exercises are helping but she still does not have full range of motion.   PAIN:  Are you having pain? Yes:  NPRS scale: 0/10 (5/10 pain when it occurs) Pain location: Right shoulder Pain description:  Sharp Aggravating factors: Moving arm out to the side Relieving factors: Medication, rest  PATIENT GOALS: Pain free with activity, full range of motion   OBJECTIVE: (objective measures completed at initial evaluation unless otherwise dated) PATIENT SURVEYS:  FOTO 63% functional status   POSTURE: Mildly rounded shoulder and forward head posture   UPPER EXTREMITY ROM:    Active ROM Right eval Left eval Right 07/22/2022  Shoulder flexion 155 170 155  Shoulder extension 35 45   Shoulder abduction 160 180   Shoulder internal rotation L5 T7 L5  Shoulder external rotation 65 (T2) 75 (T4)   Patient reports twinge with all end range motions   UPPER EXTREMITY MMT:   MMT Right eval Left eval  Shoulder flexion / scaption 5 /5 5 / 5  Shoulder extension 5 5  Shoulder abduction 4 5  Shoulder internal rotation 5 5  Shoulder external rotation 4 5  Middle trapezius 3- 4-  Lower trapezius 3- 4-  Pain with abduction and parascapular testing   SHOULDER SPECIAL TESTS:            Impingement tests: Hawkins/Kennedy impingement test: positive    JOINT MOBILITY TESTING:  Right shoulder hypomobility   PALPATION:  Non tender to plapation               TODAY'S  TREATMENT:  [facility] Adult PT Treatment:                                                DATE: 07/22/2022 Therapeutic Exercise: UBE L1 x 4 min (2 fwd/bwd) while taking subjective Sidelying sleeper stretch 3 x 20 sec Sidelying cross body stretch 3 x 20 sec Supine scapular punch with 5# 2 x 10 Row with green 2 x 10 ER/IR with red 2 x 10 each Extension with red 2 x 10 Wall slide shoulder elevation x 10 Manual: Right GHJ mobs grade III-IV primarily posterior and inferior at various ranges of elevation Scap pinned cross body and elevation stretch Right shoulder PROM all directions   [facility] Adult PT Treatment:                                                DATE: 07/12/2022 Therapeutic Exercise: Sidelying flexion x 10 Sidelying ER x  10 Row with green x 10   PATIENT EDUCATION: Education details: HEP update Person educated: Patient Education method: Explanation, Demonstration, Tactile cues, Verbal cues, and Handouts Education comprehension: verbalized understanding, returned demonstration, verbal cues required, tactile cues required, and needs further education   HOME EXERCISE PROGRAM: Access Code: [email or web address]     ASSESSMENT: CLINICAL IMPRESSION: Patient tolerated therapy well with no adverse effects. Therapy focused on progression of shoulder mobility and strengthening with good tolerance. She reports improvement in her symptoms but continues to demonstrate limitation in her shoulder motion. She was able to tolerate progression in rotator cuff strengthening and update HEP with good tolerance. Occasional cueing to avoid shoulder shrug and posture. Patient would benefit from continued skilled PT to progress      OBJECTIVE IMPAIRMENTS decreased activity tolerance, decreased ROM, decreased strength, postural dysfunction, and pain.    ACTIVITY LIMITATIONS lifting, bathing, dressing, reach over head, and hygiene/grooming   PARTICIPATION LIMITATIONS: cleaning and occupation   PERSONAL FACTORS Time since onset of injury/illness/exacerbation are also affecting patient's functional outcome.      GOALS: Goals reviewed with patient? Yes   SHORT TERM GOALS: Target date: 08/09/2022    Patient will be I with initial HEP in order to progress with therapy. Baseline: HEP assessed at eval Goal status: INITIAL   2.  PT will review FOTO with patient by 3rd visit in order to understand expected progress and outcome with therapy. Baseline: FOTO assessed at eval Goal status: INITIAL   3.  Patient will report pain with cleaning her bathtub or other household tasks </= 3/10 to reduce functional limitations Baseline: 5/10 pain Goal status: INITIAL   LONG TERM GOALS: Target date: 09/06/2022     Patient will be I with final HEP to  maintain progress from PT. Baseline: HEP assessed at eval Goal status: INITIAL   2.  Patient will report >/= 71% status on FOTO to indicate improved functional ability. Baseline: 63% Goal status: INITIAL   3.  Patient will exhibit right rotator cuff strength 5/5 MMT and parascapular strength >/= 4-/5 MMT to improve lifting ability with the right arm Baseline: patient exhibits right shoulder strength deficits (see above) Goal status: INITIAL   4.  Patient will be able to perform IR reach behind back >/= T10 in order  to improve ability to perform self care tasks and dressing Baseline: IR reach behind back to L5 Goal status: INITIAL     PLAN: PT FREQUENCY: 1x/week   PT DURATION: 8 weeks   PLANNED INTERVENTIONS: Therapeutic exercises, Therapeutic activity, Neuromuscular re-education, Balance training, Gait training, Patient/Family education, Self Care, Joint mobilization, Joint manipulation, Aquatic Therapy, Dry Needling, Electrical stimulation, Spinal manipulation, Spinal mobilization, Cryotherapy, Moist heat, Taping, Ionotophoresis '4mg'$ /ml Dexamethasone, Manual therapy, and Re-evaluation   PLAN FOR NEXT SESSION: Review HEP and progress PRN, manual/dry needling/mobs to improve posterior cuff and GHJ mobility, cross body or sleeper stretch, progress rotator cuff and parascapular strengthening as tolerated    Hilda Blades, PT, DPT, LAT, ATC 07/22/22  8:35 AM Phone: [phone] Fax: [phone]

## 2022-07-22 ENCOUNTER — Encounter: Payer: Self-pay | Admitting: Physical Therapy

## 2022-07-22 ENCOUNTER — Other Ambulatory Visit: Payer: Self-pay

## 2022-07-22 ENCOUNTER — Encounter: Payer: Self-pay | Admitting: Internal Medicine

## 2022-07-22 ENCOUNTER — Other Ambulatory Visit: Payer: 59

## 2022-07-22 ENCOUNTER — Ambulatory Visit: Payer: 59 | Attending: Internal Medicine | Admitting: Physical Therapy

## 2022-07-22 DIAGNOSIS — M25511 Pain in right shoulder: Secondary | ICD-10-CM | POA: Insufficient documentation

## 2022-07-22 DIAGNOSIS — M6281 Muscle weakness (generalized): Secondary | ICD-10-CM | POA: Insufficient documentation

## 2022-07-22 DIAGNOSIS — G8929 Other chronic pain: Secondary | ICD-10-CM | POA: Diagnosis not present

## 2022-07-22 LAB — APOLIPOPROTEIN B: Apolipoprotein B: 63 mg/dL (ref <90)

## 2022-07-22 LAB — NMR, LIPOPROFILE

## 2022-07-22 NOTE — Patient Instructions (Signed)
Access Code: [email or web address] URL: [email or web address] Date: 07/22/2022 Prepared by: Hilda Blades  Exercises - Sidelying Shoulder Flexion 15 Degrees  - 1 x daily - 3 sets - 10 reps - Standing Row with Anchored Resistance  - 1 x daily - 3 sets - 10 reps - Shoulder External Rotation with Anchored Resistance  - 1 x daily - 3 sets - 10 reps - Shoulder Flexion Wall Slide with Towel  - 1 x daily - 3 sets - 10 reps

## 2022-07-22 NOTE — Progress Notes (Signed)
Please have lab notify patient about rejected specimen and  offer to recollect appropriately with no extra charge.

## 2022-07-24 NOTE — Telephone Encounter (Signed)
Sent note   Please call in Rx for 20 mg Crestor

## 2022-07-24 NOTE — Telephone Encounter (Signed)
Please call in refill for 20 mg dose

## 2022-07-25 ENCOUNTER — Other Ambulatory Visit (HOSPITAL_COMMUNITY): Payer: Self-pay

## 2022-07-25 ENCOUNTER — Encounter: Payer: Self-pay | Admitting: Internal Medicine

## 2022-07-25 MED ORDER — ROSUVASTATIN CALCIUM 20 MG PO TABS
20.0000 mg | ORAL_TABLET | Freq: Every day | ORAL | 3 refills | Status: DC
Start: 2022-07-25 — End: 2023-06-28
  Filled 2022-07-25: qty 90, 90d supply, fill #0
  Filled 2022-10-13: qty 90, 90d supply, fill #1
  Filled 2023-01-17: qty 90, 90d supply, fill #2
  Filled 2023-04-18: qty 90, 90d supply, fill #3

## 2022-07-26 LAB — LIPOPROTEIN A (LPA): Lipoprotein (a): 390 nmol/L — ABNORMAL HIGH (ref <75)

## 2022-07-27 ENCOUNTER — Encounter: Payer: Self-pay | Admitting: Internal Medicine

## 2022-07-27 NOTE — Progress Notes (Signed)
Lipo a is very high   Ldl particle nmr was not processed  See message about recollecting   make sure not charged  Forwarding info to dr Harrington Challenger.

## 2022-07-28 ENCOUNTER — Other Ambulatory Visit: Payer: Self-pay

## 2022-07-28 DIAGNOSIS — Z79899 Other long term (current) drug therapy: Secondary | ICD-10-CM

## 2022-07-28 NOTE — Progress Notes (Signed)
Lab orders placed.  

## 2022-07-28 NOTE — Telephone Encounter (Signed)
Please send in order for home covid test disp 4 The expired tests may still be ok to use

## 2022-07-29 NOTE — Therapy (Signed)
OUTPATIENT PHYSICAL THERAPY TREATMENT NOTE   Patient Name: Sharon Wells MRN: [id number] DOB:Jan 20, 1961, 61 y.o., female Today's Date: 08/01/2022  PCP: Burnis Medin, MD   REFERRING PROVIDER: Burnis Medin, MD   END OF SESSION:   PT End of Session - 08/01/22 1448     Visit Number 3    Number of Visits 9    Date for PT Re-Evaluation 09/06/22    Authorization Type [facility]    Authorization Time Period FOTO by 6th    Progress Note Due on Visit 10    PT Start Time 1447    PT Stop Time 1530    PT Time Calculation (min) 43 min    Activity Tolerance Patient tolerated treatment well    Behavior During Therapy WFL for tasks assessed/performed              Past Medical History:  Diagnosis Date   Chicken pox    Fibroids    With history of anemia   High cholesterol    Hyperlipidemia    Past Surgical History:  Procedure Laterality Date   COLONOSCOPY  2013   In [place]   NO PAST SURGERIES     Patient Active Problem List   Diagnosis Date Noted   Hyperlipidemia 12/08/2021   Elevated coronary artery calcium score 12/08/2021   Elevated Lp(a) 12/08/2021   Change in bowel habits 01/13/2021   Flatulence 01/13/2021   Belching 01/13/2021   Essential hypertension 05/02/2017   Adverse reaction to vaccine 09/03/2014   Elevated blood pressure 09/03/2014   Other specified anemias 07/16/2014   Preventive measure 07/16/2014    REFERRING DIAG: Right shoulder pain, unspecified chronicity  THERAPY DIAG:  Chronic right shoulder pain  Muscle weakness (generalized)  Rationale for Evaluation and Treatment Rehabilitation  PERTINENT HISTORY: None  PRECAUTIONS: None   SUBJECTIVE: Patient reports she her shoulder feels good. She was out of town so unable to do all her exercises, but she can tell it feels better then she does her exercises. She still feels like she has decreased range of motion but is getting better.   PAIN:  Are you having pain? Yes:  NPRS scale: 0/10  (5/10 pain when it occurs) Pain location: Right shoulder Pain description: Sharp, catch Aggravating factors: Moving arm out to the side Relieving factors: Medication, rest  PATIENT GOALS: Pain free with activity, full range of motion   OBJECTIVE: (objective measures completed at initial evaluation unless otherwise dated) PATIENT SURVEYS:  FOTO 63% functional status   POSTURE: Mildly rounded shoulder and forward head posture   UPPER EXTREMITY ROM:    Active ROM Right eval Left eval Right 07/22/2022 Right   Shoulder flexion 155 170 155 165  Shoulder extension 35 45    Shoulder abduction 160 180    Shoulder internal rotation L5 T7 L5   Shoulder external rotation 65 (T2) 75 (T4)    Patient reports twinge with all end range motions   UPPER EXTREMITY MMT:   MMT Right eval Left eval  Shoulder flexion / scaption 5 /5 5 / 5  Shoulder extension 5 5  Shoulder abduction 4 5  Shoulder internal rotation 5 5  Shoulder external rotation 4 5  Middle trapezius 3- 4-  Lower trapezius 3- 4-  Pain with abduction and parascapular testing   SHOULDER SPECIAL TESTS:            Impingement tests: Hawkins/Kennedy impingement test: positive    JOINT MOBILITY TESTING:  Right shoulder  hypomobility   PALPATION:  Non tender to plapation               TODAY'S TREATMENT:  [facility] Adult PT Treatment:                                                DATE: 08/01/2022 Therapeutic Exercise: UBE L1 x 4 min (2 fwd/bwd) while taking subjective Sidelying sleeper stretch 3 x 20 sec Sidelying cross body stretch 3 x 20 sec Doorway stretch at 90 deg 3 x 20 sec Doorway stretch at 60 deg 3 x 20 sec Standing dowel extension 2 x 10 Manual: Right GHJ mobs grade III-IV primarily posterior and inferior at various ranges of elevation Scap pinned cross body and elevation stretch Sidelying scapulothoracic mobs all direction Right posterior cuff TPR Right shoulder PROM all directions   [facility] Adult PT Treatment:                                                 DATE: 07/22/2022 Therapeutic Exercise: UBE L1 x 4 min (2 fwd/bwd) while taking subjective Sidelying sleeper stretch 3 x 20 sec Sidelying cross body stretch 3 x 20 sec Supine scapular punch with 5# 2 x 10 Row with green 2 x 10 ER/IR with red 2 x 10 each Extension with red 2 x 10 Wall slide shoulder elevation x 10 Manual: Right GHJ mobs grade III-IV primarily posterior and inferior at various ranges of elevation Scap pinned cross body and elevation stretch Right shoulder PROM all directions  [facility] Adult PT Treatment:                                                DATE: 07/12/2022 Therapeutic Exercise: Sidelying flexion x 10 Sidelying ER x 10 Row with green x 10   PATIENT EDUCATION: Education details: HEP update Person educated: Patient Education method: Explanation, Demonstration, Tactile cues, Verbal cues, and Handouts Education comprehension: verbalized understanding, returned demonstration, verbal cues required, tactile cues required, and needs further education   HOME EXERCISE PROGRAM: Access Code: [email or web address]     ASSESSMENT: CLINICAL IMPRESSION: Patient tolerated therapy well with no adverse effects. Therapy focused primarily on improving shoulder / posterior cuff mobility and stretching to allow for improved motion. She did demonstrate an improvement in her shoulder elevation but continues to be limited with reaching behind her back. She reports majority of pain and "catching" to the posterior shoulder and did exhibit trigger points in the posterior cuff region this visit. Updated HEP to initiate pec stretching with good tolerance. Patient would benefit from continued skilled PT to progress her mobility and strength in order to reduce pain and maximize functional ability.      OBJECTIVE IMPAIRMENTS decreased activity tolerance, decreased ROM, decreased strength, postural dysfunction, and pain.    ACTIVITY LIMITATIONS lifting, bathing,  dressing, reach over head, and hygiene/grooming   PARTICIPATION LIMITATIONS: cleaning and occupation   PERSONAL FACTORS Time since onset of injury/illness/exacerbation are also affecting patient's functional outcome.      GOALS: Goals reviewed with patient? Yes   SHORT TERM GOALS: Target date:  08/09/2022    Patient will be I with initial HEP in order to progress with therapy. Baseline: HEP assessed at eval Goal status: INITIAL   2.  PT will review FOTO with patient by 3rd visit in order to understand expected progress and outcome with therapy. Baseline: FOTO assessed at eval Goal status: INITIAL   3.  Patient will report pain with cleaning her bathtub or other household tasks </= 3/10 to reduce functional limitations Baseline: 5/10 pain Goal status: INITIAL   LONG TERM GOALS: Target date: 09/06/2022     Patient will be I with final HEP to maintain progress from PT. Baseline: HEP assessed at eval Goal status: INITIAL   2.  Patient will report >/= 71% status on FOTO to indicate improved functional ability. Baseline: 63% Goal status: INITIAL   3.  Patient will exhibit right rotator cuff strength 5/5 MMT and parascapular strength >/= 4-/5 MMT to improve lifting ability with the right arm Baseline: patient exhibits right shoulder strength deficits (see above) Goal status: INITIAL   4.  Patient will be able to perform IR reach behind back >/= T10 in order to improve ability to perform self care tasks and dressing Baseline: IR reach behind back to L5 Goal status: INITIAL     PLAN: PT FREQUENCY: 1x/week   PT DURATION: 8 weeks   PLANNED INTERVENTIONS: Therapeutic exercises, Therapeutic activity, Neuromuscular re-education, Balance training, Gait training, Patient/Family education, Self Care, Joint mobilization, Joint manipulation, Aquatic Therapy, Dry Needling, Electrical stimulation, Spinal manipulation, Spinal mobilization, Cryotherapy, Moist heat, Taping, Ionotophoresis  '4mg'$ /ml Dexamethasone, Manual therapy, and Re-evaluation   PLAN FOR NEXT SESSION: Review HEP and progress PRN, manual/dry needling/mobs to improve posterior cuff and GHJ mobility, cross body or sleeper stretch, progress rotator cuff and parascapular strengthening as tolerated    Hilda Blades, PT, DPT, LAT, ATC 08/01/22  3:44 PM Phone: [phone] Fax: [phone]

## 2022-07-31 NOTE — Telephone Encounter (Signed)
So the lipoprotein A can be an  independent of  life style and a heredity predisposition.  ( Medications in research studies  ongoing).   The nmr never got processed .   Ok to repeat    ;labs   at patient request.

## 2022-08-01 ENCOUNTER — Other Ambulatory Visit: Payer: Self-pay

## 2022-08-01 ENCOUNTER — Encounter: Payer: Self-pay | Admitting: Physical Therapy

## 2022-08-01 ENCOUNTER — Ambulatory Visit: Payer: 59 | Admitting: Physical Therapy

## 2022-08-01 DIAGNOSIS — M25511 Pain in right shoulder: Secondary | ICD-10-CM | POA: Diagnosis not present

## 2022-08-01 DIAGNOSIS — M6281 Muscle weakness (generalized): Secondary | ICD-10-CM | POA: Diagnosis not present

## 2022-08-01 DIAGNOSIS — G8929 Other chronic pain: Secondary | ICD-10-CM

## 2022-08-01 NOTE — Patient Instructions (Signed)
Access Code: [email or web address] URL: [email or web address] Date: 08/01/2022 Prepared by: Hilda Blades  Exercises - Sidelying Shoulder Flexion 15 Degrees  - 1 x daily - 3 sets - 10 reps - Standing Row with Anchored Resistance  - 1 x daily - 3 sets - 10 reps - Shoulder External Rotation with Anchored Resistance  - 1 x daily - 3 sets - 10 reps - Shoulder Flexion Wall Slide with Towel  - 1 x daily - 3 sets - 10 reps - Doorway Pec Stretch at 60 Degrees Abduction with Arm Straight  - 1-2 x daily - 3 sets - 30 seconds hold

## 2022-08-08 ENCOUNTER — Ambulatory Visit: Payer: 59 | Admitting: Physical Therapy

## 2022-08-08 ENCOUNTER — Other Ambulatory Visit: Payer: Self-pay

## 2022-08-08 ENCOUNTER — Encounter: Payer: Self-pay | Admitting: Physical Therapy

## 2022-08-08 DIAGNOSIS — M25511 Pain in right shoulder: Secondary | ICD-10-CM | POA: Diagnosis not present

## 2022-08-08 DIAGNOSIS — M6281 Muscle weakness (generalized): Secondary | ICD-10-CM

## 2022-08-08 DIAGNOSIS — G8929 Other chronic pain: Secondary | ICD-10-CM | POA: Diagnosis not present

## 2022-08-08 NOTE — Therapy (Signed)
OUTPATIENT PHYSICAL THERAPY TREATMENT NOTE   Patient Name: Sharon Wells MRN: [id number] DOB:Jun 06, 1961, 61 y.o., female Today's Date: 08/08/2022  PCP: Burnis Medin, MD   REFERRING PROVIDER: Burnis Medin, MD   END OF SESSION:   PT End of Session - 08/08/22 1138     Visit Number 4    Number of Visits 9    Date for PT Re-Evaluation 09/06/22    Authorization Type [facility]    Authorization Time Period FOTO by 6th    Progress Note Due on Visit 10    PT Start Time 1130    PT Stop Time 1210    PT Time Calculation (min) 40 min    Activity Tolerance Patient tolerated treatment well    Behavior During Therapy [facility] for tasks assessed/performed               Past Medical History:  Diagnosis Date   Chicken pox    Fibroids    With history of anemia   High cholesterol    Hyperlipidemia    Past Surgical History:  Procedure Laterality Date   COLONOSCOPY  2013   In [place]   NO PAST SURGERIES     Patient Active Problem List   Diagnosis Date Noted   Hyperlipidemia 12/08/2021   Elevated coronary artery calcium score 12/08/2021   Elevated Lp(a) 12/08/2021   Change in bowel habits 01/13/2021   Flatulence 01/13/2021   Belching 01/13/2021   Essential hypertension 05/02/2017   Adverse reaction to vaccine 09/03/2014   Elevated blood pressure 09/03/2014   Other specified anemias 07/16/2014   Preventive measure 07/16/2014    REFERRING DIAG: Right shoulder pain, unspecified chronicity  THERAPY DIAG:  Chronic right shoulder pain  Muscle weakness (generalized)  Rationale for Evaluation and Treatment Rehabilitation  PERTINENT HISTORY: None  PRECAUTIONS: None   SUBJECTIVE: Patient reports she is good, sometimes it feels like she has more range and others where her shoulder feels stiffer.  PAIN:  Are you having pain? Yes:  NPRS scale: 0/10 (5/10 pain when it occurs) Pain location: Right shoulder Pain description: Sharp, catch Aggravating factors: Moving arm  out to the side Relieving factors: Medication, rest  PATIENT GOALS: Pain free with activity, full range of motion   OBJECTIVE: (objective measures completed at initial evaluation unless otherwise dated) PATIENT SURVEYS:  FOTO 63% functional status   POSTURE: Mildly rounded shoulder and forward head posture   UPPER EXTREMITY ROM:    Active ROM Right eval Left eval Right 07/22/2022 Right 08/08/2022  Shoulder flexion 155 170 155 165  Shoulder extension 35 45    Shoulder abduction 160 180    Shoulder internal rotation L5 T7 L5 L5  Shoulder external rotation 65 (T2) 75 (T4)    Patient reports twinge with all end range motions   UPPER EXTREMITY MMT:   MMT Right eval Left eval  Shoulder flexion / scaption 5 /5 5 / 5  Shoulder extension 5 5  Shoulder abduction 4 5  Shoulder internal rotation 5 5  Shoulder external rotation 4 5  Middle trapezius 3- 4-  Lower trapezius 3- 4-  Pain with abduction and parascapular testing   SHOULDER SPECIAL TESTS:            Impingement tests: Hawkins/Kennedy impingement test: positive    JOINT MOBILITY TESTING:  Right shoulder hypomobility   PALPATION:  Non tender to plapation               TODAY'S TREATMENT:  [facility] Adult PT Treatment:                                                DATE: 08/08/2022 Therapeutic Exercise: UBE L1 x 4 min (2 fwd/bwd) while taking subjective Supine serratus punch x 10 Sidelying thoracic rotation x 10 each Sidelying sleeper stretch 3 x 20 sec Seated thoracic extension stretch with hands behind head x 10 Step back stretch with chair x 10 Doorway stretch at 60 deg 3 x 20 sec Standing dowel extension stretch x 10 Standing dowel hand behind back stretch (across) x 10 Manual: Right GHJ mobs grade III-IV primarily posterior and inferior at various ranges of elevation Scap pinned cross body stretch Sidelying scapulothoracic mobs all direction Right shoulder PROM all directions   [facility] Adult PT Treatment:                                                 DATE: 08/01/2022 Therapeutic Exercise: UBE L1 x 4 min (2 fwd/bwd) while taking subjective Sidelying sleeper stretch 3 x 20 sec Sidelying cross body stretch 3 x 20 sec Doorway stretch at 90 deg 3 x 20 sec Doorway stretch at 60 deg 3 x 20 sec Standing dowel extension 2 x 10 Manual: Right GHJ mobs grade III-IV primarily posterior and inferior at various ranges of elevation Scap pinned cross body and elevation stretch Sidelying scapulothoracic mobs all direction Right posterior cuff TPR Right shoulder PROM all directions  [facility] Adult PT Treatment:                                                DATE: 07/22/2022 Therapeutic Exercise: UBE L1 x 4 min (2 fwd/bwd) while taking subjective Sidelying sleeper stretch 3 x 20 sec Sidelying cross body stretch 3 x 20 sec Supine scapular punch with 5# 2 x 10 Row with green 2 x 10 ER/IR with red 2 x 10 each Extension with red 2 x 10 Wall slide shoulder elevation x 10 Manual: Right GHJ mobs grade III-IV primarily posterior and inferior at various ranges of elevation Scap pinned cross body and elevation stretch Right shoulder PROM all directions   PATIENT EDUCATION: Education details: HEP update Person educated: Patient Education method: Explanation, Demonstration, Tactile cues, Verbal cues, and Handouts Education comprehension: verbalized understanding, returned demonstration, verbal cues required, tactile cues required, and needs further education   HOME EXERCISE PROGRAM: Access Code: [email or web address]     ASSESSMENT: CLINICAL IMPRESSION: Patient tolerated therapy well with no adverse effects. Therapy continues to focus primarily on progressing right shoulder mobility and with good tolerance. She does report pain at end ranges of motion that is primarily located to the lateral and posterior aspect of the shoulder. She continues to demonstrate main limitation with shoulder IR reaching behind her back, and added  sleeper stretch to HEP to improve shoulder IR. Patient would benefit from continued skilled PT to progress her mobility and strength in order to reduce pain and maximize functional ability.     OBJECTIVE IMPAIRMENTS decreased activity tolerance, decreased ROM, decreased strength, postural dysfunction, and pain.  ACTIVITY LIMITATIONS lifting, bathing, dressing, reach over head, and hygiene/grooming   PARTICIPATION LIMITATIONS: cleaning and occupation   PERSONAL FACTORS Time since onset of injury/illness/exacerbation are also affecting patient's functional outcome.      GOALS: Goals reviewed with patient? Yes   SHORT TERM GOALS: Target date: 08/09/2022    Patient will be I with initial HEP in order to progress with therapy. Baseline: HEP assessed at eval Goal status: INITIAL   2.  PT will review FOTO with patient by 3rd visit in order to understand expected progress and outcome with therapy. Baseline: FOTO assessed at eval Goal status: INITIAL   3.  Patient will report pain with cleaning her bathtub or other household tasks </= 3/10 to reduce functional limitations Baseline: 5/10 pain Goal status: INITIAL   LONG TERM GOALS: Target date: 09/06/2022     Patient will be I with final HEP to maintain progress from PT. Baseline: HEP assessed at eval Goal status: INITIAL   2.  Patient will report >/= 71% status on FOTO to indicate improved functional ability. Baseline: 63% Goal status: INITIAL   3.  Patient will exhibit right rotator cuff strength 5/5 MMT and parascapular strength >/= 4-/5 MMT to improve lifting ability with the right arm Baseline: patient exhibits right shoulder strength deficits (see above) Goal status: INITIAL   4.  Patient will be able to perform IR reach behind back >/= T10 in order to improve ability to perform self care tasks and dressing Baseline: IR reach behind back to L5 Goal status: INITIAL     PLAN: PT FREQUENCY: 1x/week   PT DURATION: 8  weeks   PLANNED INTERVENTIONS: Therapeutic exercises, Therapeutic activity, Neuromuscular re-education, Balance training, Gait training, Patient/Family education, Self Care, Joint mobilization, Joint manipulation, Aquatic Therapy, Dry Needling, Electrical stimulation, Spinal manipulation, Spinal mobilization, Cryotherapy, Moist heat, Taping, Ionotophoresis '4mg'$ /ml Dexamethasone, Manual therapy, and Re-evaluation   PLAN FOR NEXT SESSION: Review HEP and progress PRN, manual/dry needling/mobs to improve posterior cuff and GHJ mobility, cross body or sleeper stretch, progress rotator cuff and parascapular strengthening as tolerated    Hilda Blades, PT, DPT, LAT, ATC 08/08/22  12:31 PM Phone: [phone] Fax: [phone]

## 2022-08-08 NOTE — Patient Instructions (Signed)
Access Code: [email or web address] URL: [email or web address] Date: 08/08/2022 Prepared by: Hilda Blades  Exercises - Sidelying Shoulder Flexion 15 Degrees  - 1 x daily - 3 sets - 10 reps - Sleeper Stretch  - 1-2 x daily - 3 reps - 30 seconds hold - Standing Row with Anchored Resistance  - 1 x daily - 3 sets - 10 reps - Shoulder External Rotation with Anchored Resistance  - 1 x daily - 3 sets - 10 reps - Shoulder Flexion Wall Slide with Towel  - 1 x daily - 3 sets - 10 reps - Doorway Pec Stretch at 60 Degrees Abduction with Arm Straight  - 1-2 x daily - 3 sets - 30 seconds hold

## 2022-08-15 ENCOUNTER — Other Ambulatory Visit: Payer: Self-pay

## 2022-08-15 ENCOUNTER — Encounter: Payer: Self-pay | Admitting: Physical Therapy

## 2022-08-15 ENCOUNTER — Ambulatory Visit: Payer: 59 | Attending: Internal Medicine | Admitting: Physical Therapy

## 2022-08-15 DIAGNOSIS — M25511 Pain in right shoulder: Secondary | ICD-10-CM | POA: Diagnosis not present

## 2022-08-15 DIAGNOSIS — M6281 Muscle weakness (generalized): Secondary | ICD-10-CM | POA: Insufficient documentation

## 2022-08-15 DIAGNOSIS — G8929 Other chronic pain: Secondary | ICD-10-CM | POA: Diagnosis not present

## 2022-08-15 NOTE — Telephone Encounter (Signed)
Lab order placed. Lab appointment scheduled on 08/30/2022.

## 2022-08-15 NOTE — Therapy (Signed)
OUTPATIENT PHYSICAL THERAPY TREATMENT NOTE   Patient Name: Sharon Wells MRN: [id number] DOB:07-23-1961, 61 y.o., female Today's Date: 08/15/2022  PCP: Burnis Medin, MD   REFERRING PROVIDER: Burnis Medin, MD   END OF SESSION:   PT End of Session - 08/15/22 1649     Visit Number 5    Number of Visits 9    Date for PT Re-Evaluation 09/06/22    Authorization Type [facility]    Authorization Time Period FOTO by 6th    Progress Note Due on Visit 10    PT Start Time 1650    PT Stop Time 1730    PT Time Calculation (min) 40 min    Activity Tolerance Patient tolerated treatment well    Behavior During Therapy [facility] for tasks assessed/performed                Past Medical History:  Diagnosis Date   Chicken pox    Fibroids    With history of anemia   High cholesterol    Hyperlipidemia    Past Surgical History:  Procedure Laterality Date   COLONOSCOPY  2013   In [place]   NO PAST SURGERIES     Patient Active Problem List   Diagnosis Date Noted   Hyperlipidemia 12/08/2021   Elevated coronary artery calcium score 12/08/2021   Elevated Lp(a) 12/08/2021   Change in bowel habits 01/13/2021   Flatulence 01/13/2021   Belching 01/13/2021   Essential hypertension 05/02/2017   Adverse reaction to vaccine 09/03/2014   Elevated blood pressure 09/03/2014   Other specified anemias 07/16/2014   Preventive measure 07/16/2014    REFERRING DIAG: Right shoulder pain, unspecified chronicity  THERAPY DIAG:  Chronic right shoulder pain  Muscle weakness (generalized)  Rationale for Evaluation and Treatment Rehabilitation  PERTINENT HISTORY: None  PRECAUTIONS: None   SUBJECTIVE: Patient reports her shoulder is good. She does continue to have an occasional twinge with lifting her arm but this continues to improve.   PAIN:  Are you having pain? Yes:  NPRS scale: 0/10 (5/10 pain when it occurs) Pain location: Right shoulder Pain description: Sharp, catch Aggravating  factors: Moving arm out to the side Relieving factors: Medication, rest  PATIENT GOALS: Pain free with activity, full range of motion   OBJECTIVE: (objective measures completed at initial evaluation unless otherwise dated) PATIENT SURVEYS:  FOTO 63% functional status   POSTURE: Mildly rounded shoulder and forward head posture   UPPER EXTREMITY ROM:    Active ROM Right eval Left eval Right 07/22/2022 Right 08/08/2022  Shoulder flexion 155 170 155 165  Shoulder extension 35 45    Shoulder abduction 160 180    Shoulder internal rotation L5 T7 L5 L5  Shoulder external rotation 65 (T2) 75 (T4)    Patient reports twinge with all end range motions   UPPER EXTREMITY MMT:   MMT Right eval Left eval  Shoulder flexion / scaption 5 /5 5 / 5  Shoulder extension 5 5  Shoulder abduction 4 5  Shoulder internal rotation 5 5  Shoulder external rotation 4 5  Middle trapezius 3- 4-  Lower trapezius 3- 4-  Pain with abduction and parascapular testing   SHOULDER SPECIAL TESTS:            Impingement tests: Hawkins/Kennedy impingement test: positive    JOINT MOBILITY TESTING:  Right shoulder hypomobility               TODAY'S TREATMENT:  [facility] Adult  PT Treatment:                                                DATE: 08/08/2022 Therapeutic Exercise: UBE L1 x 4 min (2 fwd/bwd) while taking subjective Sidelying thoracic rotation x 10 each Sidelying sleeper stretch 3 x 20 sec Supine thoracic extension over FR with hands behind head at various levels Doorway stretch at 60 deg 3 x 20 sec (right only) Corner pec stretch 3 x 20 sec (right only) Supine pec stretch on FR, snow angles on FR Supine horizontal abduction on FR with yellow x 20 Supine diagonals on FR with yellow x 20 each Supine scapular protraction/retraction on FR with 8# ball x 20  Standing dowel extension stretch x 10 Standing dowel hand behind back stretch (across) x 10 Manual: Right GHJ mobs grade III-IV primarily posterior  and inferior at various ranges of elevation Scap pinned cross body stretch Right shoulder PROM all directions MWM for shoulder IR reach behind back   [facility] Adult PT Treatment:                                                DATE: 08/08/2022 Therapeutic Exercise: UBE L1 x 4 min (2 fwd/bwd) while taking subjective Supine serratus punch x 10 Sidelying thoracic rotation x 10 each Sidelying sleeper stretch 3 x 20 sec Seated thoracic extension stretch with hands behind head x 10 Step back stretch with chair x 10 Doorway stretch at 60 deg 3 x 20 sec Standing dowel extension stretch x 10 Standing dowel hand behind back stretch (across) x 10 Manual: Right GHJ mobs grade III-IV primarily posterior and inferior at various ranges of elevation Scap pinned cross body stretch Sidelying scapulothoracic mobs all direction Right shoulder PROM all directions  [facility] Adult PT Treatment:                                                DATE: 08/01/2022 Therapeutic Exercise: UBE L1 x 4 min (2 fwd/bwd) while taking subjective Sidelying sleeper stretch 3 x 20 sec Sidelying cross body stretch 3 x 20 sec Doorway stretch at 90 deg 3 x 20 sec Doorway stretch at 60 deg 3 x 20 sec Standing dowel extension 2 x 10 Manual: Right GHJ mobs grade III-IV primarily posterior and inferior at various ranges of elevation Scap pinned cross body and elevation stretch Sidelying scapulothoracic mobs all direction Right posterior cuff TPR Right shoulder PROM all directions   PATIENT EDUCATION: Education details: HEP Person educated: Patient Education method: Consulting civil engineer, Demonstration, Corporate treasurer cues, Verbal cues Education comprehension: verbalized understanding, returned demonstration, verbal cues required, tactile cues required, and needs further education   HOME EXERCISE PROGRAM: Access Code: [email or web address]     ASSESSMENT: CLINICAL IMPRESSION: Patient tolerated therapy well with no adverse effects. Therapy continues to focus  on progressing right shoulder mobility and postural control. She continues to have greatest difficulty with reaching behind back but overall seems to be improving and progressing with therapy. Incorporated more postural strengthening in supine position and trialed MWM for shoulder IR behind back without much success. No  changes made to HEP this visit. Patient would benefit from continued skilled PT to progress her mobility and strength in order to reduce pain and maximize functional ability.     OBJECTIVE IMPAIRMENTS decreased activity tolerance, decreased ROM, decreased strength, postural dysfunction, and pain.    ACTIVITY LIMITATIONS lifting, bathing, dressing, reach over head, and hygiene/grooming   PARTICIPATION LIMITATIONS: cleaning and occupation   PERSONAL FACTORS Time since onset of injury/illness/exacerbation are also affecting patient's functional outcome.      GOALS: Goals reviewed with patient? Yes   SHORT TERM GOALS: Target date: 08/09/2022    Patient will be I with initial HEP in order to progress with therapy. Baseline: HEP assessed at eval 08/15/2022: independent Goal status: MET   2.  PT will review FOTO with patient by 3rd visit in order to understand expected progress and outcome with therapy. Baseline: FOTO assessed at eval 08/15/2022: reviewed Goal status: MET   3.  Patient will report pain with cleaning her bathtub or other household tasks </= 3/10 to reduce functional limitations Baseline: 5/10 pain 08/15/2022: patient reports improvement in symptoms Goal status: ONGOING   LONG TERM GOALS: Target date: 09/06/2022     Patient will be I with final HEP to maintain progress from PT. Baseline: HEP assessed at eval Goal status: INITIAL   2.  Patient will report >/= 71% status on FOTO to indicate improved functional ability. Baseline: 63% Goal status: INITIAL   3.  Patient will exhibit right rotator cuff strength 5/5 MMT and parascapular strength >/= 4-/5 MMT to  improve lifting ability with the right arm Baseline: patient exhibits right shoulder strength deficits (see above) Goal status: INITIAL   4.  Patient will be able to perform IR reach behind back >/= T10 in order to improve ability to perform self care tasks and dressing Baseline: IR reach behind back to L5 Goal status: INITIAL     PLAN: PT FREQUENCY: 1x/week   PT DURATION: 8 weeks   PLANNED INTERVENTIONS: Therapeutic exercises, Therapeutic activity, Neuromuscular re-education, Balance training, Gait training, Patient/Family education, Self Care, Joint mobilization, Joint manipulation, Aquatic Therapy, Dry Needling, Electrical stimulation, Spinal manipulation, Spinal mobilization, Cryotherapy, Moist heat, Taping, Ionotophoresis 3m/ml Dexamethasone, Manual therapy, and Re-evaluation   PLAN FOR NEXT SESSION: Review HEP and progress PRN, manual/dry needling/mobs to improve posterior cuff and GHJ mobility, cross body or sleeper stretch, progress rotator cuff and parascapular strengthening as tolerated    CHilda Blades PT, DPT, LAT, ATC 08/15/22  5:43 PM Phone: [phone]: [phone]

## 2022-08-18 NOTE — Telephone Encounter (Signed)
Patient had change her mind on the request.

## 2022-08-23 ENCOUNTER — Other Ambulatory Visit: Payer: Self-pay

## 2022-08-23 NOTE — Therapy (Signed)
OUTPATIENT PHYSICAL THERAPY TREATMENT NOTE   Patient Name: Sharon Wells MRN: [id number] DOB:25-Sep-1961, 61 y.o., female Today's Date: 08/24/2022  PCP: Burnis Medin, MD   REFERRING PROVIDER: Burnis Medin, MD   END OF SESSION:   PT End of Session - 08/24/22 1648     Visit Number 6    Number of Visits 9    Date for PT Re-Evaluation 09/06/22    Authorization Type [facility]    Authorization Time Period FOTO by 6th    Progress Note Due on Visit 10    PT Start Time 1650    PT Stop Time 1736    PT Time Calculation (min) 46 min    Activity Tolerance Patient tolerated treatment well    Behavior During Therapy WFL for tasks assessed/performed                 Past Medical History:  Diagnosis Date   Chicken pox    Fibroids    With history of anemia   High cholesterol    Hyperlipidemia    Past Surgical History:  Procedure Laterality Date   COLONOSCOPY  2013   In [place]   NO PAST SURGERIES     Patient Active Problem List   Diagnosis Date Noted   Hyperlipidemia 12/08/2021   Elevated coronary artery calcium score 12/08/2021   Elevated Lp(a) 12/08/2021   Change in bowel habits 01/13/2021   Flatulence 01/13/2021   Belching 01/13/2021   Essential hypertension 05/02/2017   Adverse reaction to vaccine 09/03/2014   Elevated blood pressure 09/03/2014   Other specified anemias 07/16/2014   Preventive measure 07/16/2014    REFERRING DIAG: Right shoulder pain, unspecified chronicity  THERAPY DIAG:  Chronic right shoulder pain  Muscle weakness (generalized)  Rationale for Evaluation and Treatment Rehabilitation  PERTINENT HISTORY: None  PRECAUTIONS: None   SUBJECTIVE: Patient reports she is doing alright. She feels like her shoulder continues to improve.   PAIN:  Are you having pain? Yes:  NPRS scale: 0/10 (5/10 pain when it occurs) Pain location: Right shoulder Pain description: Sharp, catch Aggravating factors: Moving arm out to the  side Relieving factors: Medication, rest  PATIENT GOALS: Pain free with activity, full range of motion   OBJECTIVE: (objective measures completed at initial evaluation unless otherwise dated) PATIENT SURVEYS:  FOTO 63% functional status 08/24/2022: 60%   POSTURE: Mildly rounded shoulder and forward head posture   UPPER EXTREMITY ROM:    Active ROM Right eval Left eval Right 07/22/2022 Right 08/08/2022  Shoulder flexion 155 170 155 165  Shoulder extension 35 45    Shoulder abduction 160 180    Shoulder internal rotation L5 T7 L5 L5  Shoulder external rotation 65 (T2) 75 (T4)    Patient reports twinge with all end range motions   UPPER EXTREMITY MMT:   MMT Right eval Left eval  Shoulder flexion / scaption 5 /5 5 / 5  Shoulder extension 5 5  Shoulder abduction 4 5  Shoulder internal rotation 5 5  Shoulder external rotation 4 5  Middle trapezius 3- 4-  Lower trapezius 3- 4-  Pain with abduction and parascapular testing   SHOULDER SPECIAL TESTS:            Impingement tests: Hawkins/Kennedy impingement test: positive    JOINT MOBILITY TESTING:  Right shoulder hypomobility               TODAY'S TREATMENT:  [facility] Adult PT Treatment:  DATE: 08/24/2022 Therapeutic Exercise: UBE L1 x 4 min (2 fwd/bwd) while taking subjective Sidelying sleeper stretch 3 x 20 sec Sidelying cross body stretch 3 x 20 sec Sidelying thoracic rotation x 10 each Seated thoracic extension with hands grasped behind head x 10 Standing dowel extension stretch x 10 Manual: Skilled palpation and monitoring of muscle tension while performing TPDN Right GHJ mobs grade III-IV primarily posterior and inferior at various ranges of elevation Scap pinned cross body stretch Right shoulder PROM all directions STM/TPR for posterior cuff region Trigger Point Dry Needling Treatment: Pre-treatment instruction: Patient instructed on dry needling rationale,  procedures, and possible side effects including pain during treatment (achy,cramping feeling), bruising, drop of blood, lightheadedness, nausea, sweating. Patient Consent Given: Yes Education handout provided: No Muscles treated: right infraspinatus, teres minor, posterior deltoid  Needle size and number: .30x73m x 4 Electrical stimulation performed: No Parameters: N/A Treatment response/outcome: Twitch response elicited, Palpable decrease in muscle tension, and patient report of improved mobility Post-treatment instructions: Patient instructed to expect possible mild to moderate muscle soreness later today and/or tomorrow. Patient instructed in methods to reduce muscle soreness and to continue prescribed HEP. If patient was dry needled over the lung field, patient was instructed on signs and symptoms of pneumothorax and, however unlikely, to see immediate medical attention should they occur. Patient was also educated on signs and symptoms of infection and to seek medical attention should they occur. Patient verbalized understanding of these instructions and education.   [facility]                                                [facility]: 08/15/2022 Therapeutic Exercise: UBE L1 x 4 min (2 fwd/bwd) while taking subjective Sidelying thoracic rotation x 10 each Sidelying sleeper stretch 3 x 20 sec Supine thoracic extension over FR with hands behind head at various levels Doorway stretch at 60 deg 3 x 20 sec (right only) Corner pec stretch 3 x 20 sec (right only) Supine pec stretch on FR, snow angles on FR Supine horizontal abduction on FR with yellow x 20 Supine diagonals on FR with yellow x 20 each Supine scapular protraction/retraction on FR with 8# ball x 20  Standing dowel extension stretch x 10 Standing dowel hand behind back stretch (across) x 10 Manual: Right GHJ mobs grade III-IV primarily posterior and inferior at various ranges of elevation Scap pinned cross body  stretch Right shoulder PROM all directions MWM for shoulder IR reach behind back  [facility]                                                DATE: 08/08/2022 Therapeutic Exercise: UBE L1 x 4 min (2 fwd/bwd) while taking subjective Supine serratus punch x 10 Sidelying thoracic rotation x 10 each Sidelying sleeper stretch 3 x 20 sec Seated thoracic extension stretch with hands behind head x 10 Step back stretch with chair x 10 Doorway stretch at 60 deg 3 x 20 sec Standing dowel extension stretch x 10 Standing dowel hand behind back stretch (across) x 10 Manual: Right GHJ mobs grade III-IV primarily posterior and inferior at various ranges of elevation Scap pinned cross body stretch Sidelying scapulothoracic mobs all  direction Right shoulder PROM all directions   PATIENT EDUCATION: Education details: HEP, TPDN Person educated: Patient Education method: Explanation, Demonstration, Tactile cues, Verbal cues Education comprehension: verbalized understanding, returned demonstration, verbal cues required, tactile cues required, and needs further education   HOME EXERCISE PROGRAM: Access Code: [email or web address]     ASSESSMENT: CLINICAL IMPRESSION: Patient tolerated therapy well with no adverse effects. Therapy focused primarily on improving shoulder mobility and strengthening. Incorporated TPDN this visit for posterior cuff with good tolerance and elicited twitch response. She did report reduction on FOTO but this was due to question regarding rigorous contact sports. She is subjectively reporting improvement in shoulder mobility and reduction in the twinges she feels with activity. No changes to HEP this visit. Patient would benefit from continued skilled PT to progress her mobility and strength in order to reduce pain and maximize functional ability.     OBJECTIVE IMPAIRMENTS decreased activity tolerance, decreased ROM, decreased strength, postural dysfunction, and pain.    ACTIVITY  LIMITATIONS lifting, bathing, dressing, reach over head, and hygiene/grooming   PARTICIPATION LIMITATIONS: cleaning and occupation   PERSONAL FACTORS Time since onset of injury/illness/exacerbation are also affecting patient's functional outcome.      GOALS: Goals reviewed with patient? Yes   SHORT TERM GOALS: Target date: 08/09/2022    Patient will be I with initial HEP in order to progress with therapy. Baseline: HEP assessed at eval 08/15/2022: independent Goal status: MET   2.  PT will review FOTO with patient by 3rd visit in order to understand expected progress and outcome with therapy. Baseline: FOTO assessed at eval 08/15/2022: reviewed Goal status: MET   3.  Patient will report pain with cleaning her bathtub or other household tasks </= 3/10 to reduce functional limitations Baseline: 5/10 pain 08/15/2022: patient reports improvement in symptoms Goal status: ONGOING   LONG TERM GOALS: Target date: 09/06/2022     Patient will be I with final HEP to maintain progress from PT. Baseline: HEP assessed at eval Goal status: INITIAL   2.  Patient will report >/= 71% status on FOTO to indicate improved functional ability. Baseline: 63% Goal status: INITIAL   3.  Patient will exhibit right rotator cuff strength 5/5 MMT and parascapular strength >/= 4-/5 MMT to improve lifting ability with the right arm Baseline: patient exhibits right shoulder strength deficits (see above) Goal status: INITIAL   4.  Patient will be able to perform IR reach behind back >/= T10 in order to improve ability to perform self care tasks and dressing Baseline: IR reach behind back to L5 Goal status: INITIAL     PLAN: PT FREQUENCY: 1x/week   PT DURATION: 8 weeks   PLANNED INTERVENTIONS: Therapeutic exercises, Therapeutic activity, Neuromuscular re-education, Balance training, Gait training, Patient/Family education, Self Care, Joint mobilization, Joint manipulation, Aquatic Therapy, Dry Needling,  Electrical stimulation, Spinal manipulation, Spinal mobilization, Cryotherapy, Moist heat, Taping, Ionotophoresis 40m/ml Dexamethasone, Manual therapy, and Re-evaluation   PLAN FOR NEXT SESSION: Review HEP and progress PRN, manual/dry needling/mobs to improve posterior cuff and GHJ mobility, cross body or sleeper stretch, progress rotator cuff and parascapular strengthening as tolerated    CHilda Blades PT, DPT, LAT, ATC 08/24/22  5:38 PM Phone: [phone]: [phone]

## 2022-08-24 ENCOUNTER — Encounter: Payer: Self-pay | Admitting: Physical Therapy

## 2022-08-24 ENCOUNTER — Other Ambulatory Visit: Payer: Self-pay

## 2022-08-24 ENCOUNTER — Ambulatory Visit: Payer: 59 | Admitting: Physical Therapy

## 2022-08-24 DIAGNOSIS — G8929 Other chronic pain: Secondary | ICD-10-CM

## 2022-08-24 DIAGNOSIS — M25511 Pain in right shoulder: Secondary | ICD-10-CM | POA: Diagnosis not present

## 2022-08-24 DIAGNOSIS — M6281 Muscle weakness (generalized): Secondary | ICD-10-CM | POA: Diagnosis not present

## 2022-08-29 NOTE — Therapy (Signed)
OUTPATIENT PHYSICAL THERAPY TREATMENT NOTE  DISCHARGE   Patient Name: Sharon Wells MRN: [id number] DOB:February 27, 1961, 61 y.o., female Today's Date: 08/30/2022  PCP: Burnis Medin, MD   REFERRING PROVIDER: Burnis Medin, MD   END OF SESSION:   PT End of Session - 08/30/22 1700     Visit Number 7    Number of Visits 9    Date for PT Re-Evaluation 09/06/22    Authorization Type [facility]    Authorization Time Period FOTO by 10th    Progress Note Due on Visit 10    PT Start Time 1656    PT Stop Time 1738    PT Time Calculation (min) 42 min    Activity Tolerance Patient tolerated treatment well    Behavior During Therapy [facility] for tasks assessed/performed                  Past Medical History:  Diagnosis Date   Chicken pox    Fibroids    With history of anemia   High cholesterol    Hyperlipidemia    Past Surgical History:  Procedure Laterality Date   COLONOSCOPY  2013   In [place]   NO PAST SURGERIES     Patient Active Problem List   Diagnosis Date Noted   Hyperlipidemia 12/08/2021   Elevated coronary artery calcium score 12/08/2021   Elevated Lp(a) 12/08/2021   Change in bowel habits 01/13/2021   Flatulence 01/13/2021   Belching 01/13/2021   Essential hypertension 05/02/2017   Adverse reaction to vaccine 09/03/2014   Elevated blood pressure 09/03/2014   Other specified anemias 07/16/2014   Preventive measure 07/16/2014    REFERRING DIAG: Right shoulder pain, unspecified chronicity  THERAPY DIAG:  Chronic right shoulder pain  Muscle weakness (generalized)  Rationale for Evaluation and Treatment Rehabilitation  PERTINENT HISTORY: None  PRECAUTIONS: None   SUBJECTIVE: Patient reports she is doing about the same. She feels like she is still limited primarily with reaching behind her back. She does note that the pain has improved and the exercises continue to help.  PAIN:  Are you having pain? Yes:  NPRS scale: 0/10 (5/10 pain when it  occurs) Pain location: Right shoulder Pain description: Sharp, catch Aggravating factors: Moving arm out to the side Relieving factors: Medication, rest  PATIENT GOALS: Pain free with activity, full range of motion   OBJECTIVE: (objective measures completed at initial evaluation unless otherwise dated) PATIENT SURVEYS:  FOTO 63% functional status 08/24/2022: 60%   POSTURE: Mildly rounded shoulder and forward head posture   UPPER EXTREMITY ROM:    Active ROM Right eval Left eval Right 07/22/2022 Right 08/08/2022 Right 08/30/2022  Shoulder flexion 155 170 155 165 165  Shoulder extension 35 45     Shoulder abduction 160 180     Shoulder internal rotation L5 T7 L5 L5 L5  Shoulder external rotation 65 (T2) 75 (T4)     Patient reports twinge with all end range motions   UPPER EXTREMITY MMT:   MMT Right eval Left eval Rt / Lt 08/30/2022  Shoulder flexion / scaption 5 /5 5 / 5   Shoulder extension 5 5   Shoulder abduction '4 5 4 ' / 5  Shoulder internal rotation 5 5   Shoulder external rotation 4 5   Middle trapezius 3- 4- 3+ / 4-  Lower trapezius 3- 4- 3+ / 4-    SHOULDER SPECIAL TESTS:  Impingement tests: Hawkins/Kennedy impingement test: positive    JOINT MOBILITY TESTING:  Right shoulder hypomobility               TODAY'S TREATMENT:  [facility] Adult PT Treatment:                                                DATE: 08/30/2022 Therapeutic Exercise: UBE L1 x 4 min (2 fwd/bwd) while taking subjective Supine shoulder flexion with 3# x 10 Supine serratus punch x 10 Supine shoulder flexion with yellow loop x 10 Single arm row with blue 2 x 15 with right ER with red 2 x 10 with right Standing dowel extension stretch x 10 Manual: Right GHJ mobs grade III-IV primarily posterior and inferior at various ranges of elevation Scap pinned elevation and cross body stretch Right shoulder PROM all directions   [facility] Adult PT Treatment:                                                 DATE: 08/24/2022 Therapeutic Exercise: UBE L1 x 4 min (2 fwd/bwd) while taking subjective Sidelying sleeper stretch 3 x 20 sec Sidelying cross body stretch 3 x 20 sec Sidelying thoracic rotation x 10 each Seated thoracic extension with hands grasped behind head x 10 Standing dowel extension stretch x 10 Manual: Skilled palpation and monitoring of muscle tension while performing TPDN Right GHJ mobs grade III-IV primarily posterior and inferior at various ranges of elevation Scap pinned cross body stretch Right shoulder PROM all directions STM/TPR for posterior cuff region Trigger Point Dry Needling Treatment: Pre-treatment instruction: Patient instructed on dry needling rationale, procedures, and possible side effects including pain during treatment (achy,cramping feeling), bruising, drop of blood, lightheadedness, nausea, sweating. Patient Consent Given: Yes Education handout provided: No Muscles treated: right infraspinatus, teres minor, posterior deltoid  Needle size and number: .30x46m x 4 Electrical stimulation performed: No Parameters: N/A Treatment response/outcome: Twitch response elicited, Palpable decrease in muscle tension, and patient report of improved mobility Post-treatment instructions: Patient instructed to expect possible mild to moderate muscle soreness later today and/or tomorrow. Patient instructed in methods to reduce muscle soreness and to continue prescribed HEP. If patient was dry needled over the lung field, patient was instructed on signs and symptoms of pneumothorax and, however unlikely, to see immediate medical attention should they occur. Patient was also educated on signs and symptoms of infection and to seek medical attention should they occur. Patient verbalized understanding of these instructions and education.  [facility]Adult [facility]:                                                DATE: 08/15/2022 Therapeutic Exercise: UBE L1 x 4 min (2  fwd/bwd) while taking subjective Sidelying thoracic rotation x 10 each Sidelying sleeper stretch 3 x 20 sec Supine thoracic extension over FR with hands behind head at various levels Doorway stretch at 60 deg 3 x 20 sec (right only) Corner pec stretch 3 x 20 sec (right only) Supine pec stretch on FR, snow angles on FR Supine horizontal abduction on  FR with yellow x 20 Supine diagonals on FR with yellow x 20 each Supine scapular protraction/retraction on FR with 8# ball x 20  Standing dowel extension stretch x 10 Standing dowel hand behind back stretch (across) x 10 Manual: Right GHJ mobs grade III-IV primarily posterior and inferior at various ranges of elevation Scap pinned cross body stretch Right shoulder PROM all directions MWM for shoulder IR reach behind back   PATIENT EDUCATION: Education details: POC discharge, HEP, follow-up with her referring provider if symptoms do not improve regarding further treatment such as GHJ injection Person educated: Patient Education method: Explanation, Demonstration Education comprehension: verbalized understanding, returned demonstration  HOME EXERCISE PROGRAM: Access Code: [email or web address]     ASSESSMENT: CLINICAL IMPRESSION: Patient tolerated therapy well with no adverse effects. She has demonstrated improvement in her right shoulder motion for elevation, but remains limited with reaching behind her back and with increased pain at end ranges of motion. She is independent with her HEP and will continue to work on her exercises to further improve symptoms. Patient instructed to follow-up with her referring provided if no improvement in symptoms including reaching behind her back regarding further assessment and treatment such as injection and follow-up with PT. Patient will be formally discharged from PT at this time.     OBJECTIVE IMPAIRMENTS decreased activity tolerance, decreased ROM, decreased strength, postural dysfunction, and pain.     ACTIVITY LIMITATIONS lifting, bathing, dressing, reach over head, and hygiene/grooming   PARTICIPATION LIMITATIONS: cleaning and occupation   PERSONAL FACTORS Time since onset of injury/illness/exacerbation are also affecting patient's functional outcome.      GOALS: Goals reviewed with patient? Yes   SHORT TERM GOALS: Target date: 08/09/2022    Patient will be I with initial HEP in order to progress with therapy. Baseline: HEP assessed at eval 08/15/2022: independent Goal status: MET   2.  PT will review FOTO with patient by 3rd visit in order to understand expected progress and outcome with therapy. Baseline: FOTO assessed at eval 08/15/2022: reviewed Goal status: MET   3.  Patient will report pain with cleaning her bathtub or other household tasks </= 3/10 to reduce functional limitations Baseline: 5/10 pain 08/15/2022: patient reports improvement in symptoms 08/30/2022: patient continues to report pain with activites Goal status: NOT MET   LONG TERM GOALS: Target date: 09/06/2022     Patient will be I with final HEP to maintain progress from PT. Baseline: HEP assessed at eval 08/30/2022: independent Goal status: MET   2.  Patient will report >/= 71% status on FOTO to indicate improved functional ability. Baseline: 63% 08/24/2022: 60% Goal status: NOT MET   3.  Patient will exhibit right rotator cuff strength 5/5 MMT and parascapular strength >/= 4-/5 MMT to improve lifting ability with the right arm Baseline: patient exhibits right shoulder strength deficits (see above) 08/30/2022: patient exhibits right shoulder strength deficits Goal status: NOT MET   4.  Patient will be able to perform IR reach behind back >/= T10 in order to improve ability to perform self care tasks and dressing Baseline: IR reach behind back to L5 08/30/2022: IR reach behind back to L5 Goal status: NOT MET     PLAN: PT FREQUENCY: 1x/week   PT DURATION: 8 weeks   PLANNED INTERVENTIONS:  Therapeutic exercises, Therapeutic activity, Neuromuscular re-education, Balance training, Gait training, Patient/Family education, Self Care, Joint mobilization, Joint manipulation, Aquatic Therapy, Dry Needling, Electrical stimulation, Spinal manipulation, Spinal mobilization, Cryotherapy, Moist heat, Taping, Ionotophoresis 54m/ml Dexamethasone, Manual  therapy, and Re-evaluation   PLAN FOR NEXT SESSION: NA - discharge    Hilda Blades, PT, DPT, LAT, ATC 08/30/22  5:54 PM Phone: [phone] Fax: [phone]   PHYSICAL THERAPY DISCHARGE SUMMARY  Visits from Start of Care: 7  Current functional level related to goals / functional outcomes: See above   Remaining deficits: See above   Education / Equipment: HEP   Patient agrees to discharge. Patient goals were not met. Patient is being discharged due to lack of progress.

## 2022-08-30 ENCOUNTER — Other Ambulatory Visit: Payer: Self-pay

## 2022-08-30 ENCOUNTER — Encounter: Payer: Self-pay | Admitting: Physical Therapy

## 2022-08-30 ENCOUNTER — Other Ambulatory Visit (INDEPENDENT_AMBULATORY_CARE_PROVIDER_SITE_OTHER): Payer: 59

## 2022-08-30 ENCOUNTER — Ambulatory Visit: Payer: 59 | Admitting: Physical Therapy

## 2022-08-30 DIAGNOSIS — G8929 Other chronic pain: Secondary | ICD-10-CM

## 2022-08-30 DIAGNOSIS — M25511 Pain in right shoulder: Secondary | ICD-10-CM | POA: Diagnosis not present

## 2022-08-30 DIAGNOSIS — Z79899 Other long term (current) drug therapy: Secondary | ICD-10-CM | POA: Diagnosis not present

## 2022-08-30 DIAGNOSIS — M6281 Muscle weakness (generalized): Secondary | ICD-10-CM

## 2022-08-31 NOTE — Progress Notes (Unsigned)
Cardiology Office Note   Date:  09/01/2022   ID:  Sharon Wells, DOB 10/23/1961, MRN [id number]  PCP:  Burnis Medin, MD  Cardiologist:   Dorris Carnes, MD   [name] presents for a follow up of palpitations and CAD    History of Present Illness: Sharon Wells is a 61 y.o. female with a history of CAD (calcium score 48 on CT scan in 2022, 85th percentile for age). She also has a hx of papitations.    She complained of a lot of racing   No dizziness    Monitor in March 2023 showed no arrhythmias  I last saw the pt in June 2023 Since seen she has done well  No palpitations   No CP  Active  Walks 8 to 10,000 steps per day   Current Meds  Medication Sig   Ascorbic Acid (VITA-C PO) Take by mouth.   cyanocobalamin 2000 MCG tablet Take 2,000 mcg by mouth daily.   NON FORMULARY daily. [employer]: Multivitamin Complex with TRE-EN-EN Grain Concentrates   rosuvastatin (CRESTOR) 20 MG tablet Take 1 tablet (20 mg total) by mouth daily.     Allergies:   Influenza vaccines and Eggs or egg-derived products   Past Medical History:  Diagnosis Date   Chicken pox    Fibroids    With history of anemia   High cholesterol    Hyperlipidemia     Past Surgical History:  Procedure Laterality Date   COLONOSCOPY  2013   In [place]   NO PAST SURGERIES       Social History:  The patient  reports that she has never smoked. She has never used smokeless tobacco. She reports that she does not drink alcohol and does not use drugs.   Family History:  The patient's family history includes COPD in her sister; Diabetes in her sister; Heart disease in her maternal grandmother; Hypertension in her sister; Pulmonary embolism in her sister; Sarcoidosis in her mother and sister.    ROS:  Please see the history of present illness. All other systems are reviewed and  Negative to the above problem except as noted.    PHYSICAL EXAM: VS:  BP 130/80   Pulse 77   Ht '5\' 4"'$  (1.626 m)   Wt 138 lb (62.6 kg)   LMP  12/15/2013   SpO2 99%   BMI 23.69 kg/m   GEN: Well nourished, well developed, in no acute distress  HEENT: normal  Neck: no JVD, Cardiac: RRR  No murmurs.  No LE edema  Respiratory:  clear to auscultation bilaterally GI: soft, nontender, nondistended, + BS  No hepatomegaly  MS: no deformity Moving all extremities   Skin: warm and dry, no rash Neuro:  Grossly intact   Psych: euthymic mood, full affect   EKG:  EKG is not ordered today.  Lipid Panel    Component Value Date/Time   CHOL 142 02/01/2022 0000   TRIG 84 02/01/2022 0000   HDL 54 02/01/2022 0000   CHOLHDL 3 08/26/2021 0756   VLDL 17.0 08/26/2021 0756   LDLCALC 72 02/01/2022 0000      Wt Readings from Last 3 Encounters:  09/01/22 138 lb (62.6 kg)  05/06/22 142 lb (64.4 kg)  03/15/22 141 lb 9.6 oz (64.2 kg)      ASSESSMENT AND PLAN:  1  CAD   Ca score is 48 on CT scan    Cpntinue risk factor modification    2  Hx  palpitatons  Monitor in March 2023 was unremarkable    PT asymptomatic    3  HL   Currently on Crestor 20 mg   Last LDL 70 with particle number below 1000  PT has very high Lpa   (no need to recheck now )   Keep at low goal   Risk factor modify  4  Metabolics  last K2H 2 years ago  SHould have again at next check   Range 5.7 to 6 over past 5 yers  Discussed what this means   Recomm  Lower carbs, lots of veggies      Follow up in 1 year   Current medicines are reviewed at length with the patient today.  The patient does not have concerns regarding medicines.  Signed, Dorris Carnes, MD  09/01/2022 8:18 AM    [address], [place], [place]  [address] Phone: [phone]; Fax: [phone]

## 2022-09-01 ENCOUNTER — Encounter: Payer: Self-pay | Admitting: Internal Medicine

## 2022-09-01 ENCOUNTER — Ambulatory Visit: Payer: 59 | Attending: Internal Medicine | Admitting: Internal Medicine

## 2022-09-01 VITALS — BP 130/80 | HR 77 | Ht 64.0 in | Wt 138.0 lb

## 2022-09-01 DIAGNOSIS — I2510 Atherosclerotic heart disease of native coronary artery without angina pectoris: Secondary | ICD-10-CM | POA: Diagnosis not present

## 2022-09-01 LAB — NMR, LIPOPROFILE
Cholesterol, Total: 136 mg/dL (ref 100–199)
HDL Particle Number: 34 umol/L (ref >=30.5)
HDL-C: 51 mg/dL (ref >39)
LDL Particle Number: 920 nmol/L (ref <1000)
LDL Size: 21.5 nm (ref >20.5)
LDL-C (NIH Calc): 70 mg/dL (ref 0–99)
LP-IR Score: 39 (ref <=45)
Small LDL Particle Number: 331 nmol/L (ref <=527)
Triglycerides: 74 mg/dL (ref 0–149)

## 2022-09-01 LAB — APOLIPOPROTEIN B: Apolipoprotein B: 69 mg/dL (ref <90)

## 2022-09-01 NOTE — Patient Instructions (Signed)
Medication Instructions:   *If you need a refill on your cardiac medications before your next appointment, please call your pharmacy*   Lab Work:  If you have labs (blood work) drawn today and your tests are completely normal, you will receive your results only by: MyChart Message (if you have MyChart) OR A paper copy in the mail If you have any lab test that is abnormal or we need to change your treatment, we will call you to review the results.   Testing/Procedures:    Follow-Up: At [facility], you and your health needs are our priority.  As part of our continuing mission to provide you with exceptional heart care, we have created designated Provider Care Teams.  These Care Teams include your primary Cardiologist (physician) and Advanced Practice Providers (APPs -  Physician Assistants and Nurse Practitioners) who all work together to provide you with the care you need, when you need it.  We recommend signing up for the patient portal called "MyChart".  Sign up information is provided on this After Visit Summary.  MyChart is used to connect with patients for Virtual Visits (Telemedicine).  Patients are able to view lab/test results, encounter notes, upcoming appointments, etc.  Non-urgent messages can be sent to your provider as well.   To learn more about what you can do with MyChart, go to [email or web address].    Your next appointment:   1 year(s)  The format for your next appointment:   In Person  Provider:   Paula Ross, MD     Other Instructions   Important Information About Sugar       

## 2022-09-02 ENCOUNTER — Encounter: Payer: Self-pay | Admitting: Podiatry

## 2022-09-02 ENCOUNTER — Encounter: Payer: Self-pay | Admitting: Internal Medicine

## 2022-09-02 ENCOUNTER — Ambulatory Visit (INDEPENDENT_AMBULATORY_CARE_PROVIDER_SITE_OTHER): Payer: 59 | Admitting: Podiatry

## 2022-09-02 DIAGNOSIS — M2041 Other hammer toe(s) (acquired), right foot: Secondary | ICD-10-CM | POA: Diagnosis not present

## 2022-09-02 DIAGNOSIS — M779 Enthesopathy, unspecified: Secondary | ICD-10-CM | POA: Diagnosis not present

## 2022-09-02 LAB — LIPOPROTEIN A (LPA): Lipoprotein (a): 422 nmol/L — ABNORMAL HIGH (ref <75)

## 2022-09-02 NOTE — Progress Notes (Signed)
Subjective:   Patient ID: Sharon Wells, female   DOB: 61 y.o.   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  Patient encouraged to call with questions concerns and I debrided tissue courtesy today

## 2022-09-03 NOTE — Progress Notes (Signed)
Similar results   ( no need to recheck lipo a as currently   will not change with current  available interventions)

## 2022-09-13 ENCOUNTER — Ambulatory Visit (INDEPENDENT_AMBULATORY_CARE_PROVIDER_SITE_OTHER): Payer: 59 | Admitting: Obstetrics & Gynecology

## 2022-09-13 ENCOUNTER — Other Ambulatory Visit (HOSPITAL_COMMUNITY)
Admission: RE | Admit: 2022-09-13 | Discharge: 2022-09-13 | Disposition: A | Discharge status: Home / Self Care | Payer: 59 | Source: Ambulatory Visit | Attending: Obstetrics & Gynecology | Admitting: Obstetrics & Gynecology

## 2022-09-13 ENCOUNTER — Encounter: Payer: Self-pay | Admitting: Obstetrics & Gynecology

## 2022-09-13 VITALS — BP 120/76 | HR 70 | Resp 16 | Ht 62.75 in | Wt 138.0 lb

## 2022-09-13 DIAGNOSIS — R87610 Atypical squamous cells of undetermined significance on cytologic smear of cervix (ASC-US): Secondary | ICD-10-CM

## 2022-09-13 DIAGNOSIS — Z01419 Encounter for gynecological examination (general) (routine) without abnormal findings: Secondary | ICD-10-CM | POA: Insufficient documentation

## 2022-09-13 DIAGNOSIS — Z780 Asymptomatic menopausal state: Secondary | ICD-10-CM | POA: Diagnosis not present

## 2022-09-13 NOTE — Progress Notes (Signed)
KYNSLIE RINGLE 1961/03/01 [id number]   History:    61 y.o.  G3P3L3 Divorced.  Has grand-children.   RP:  Established patient presenting for annual gyn exam    HPI: Postmenopausal, well on no hormone replacement therapy.  No postmenopausal bleeding.  No pelvic pain.  Abstinent.  Pap Neg 08/2021.  ASCUS/HPV HR Neg in 2021.  Pap reflex today.  Urine and bowel movements normal.  Colono 09/2018 benign polyps, repeat at 5 years.  Breasts normal.  Mammo 10/2021, Rt Dx mammo/US Benign in 11/2021.  Body mass index 24.64.  Good fitness, walking.  BD normal in 12/2017, repeat at 61 yo.  Low Cholesterol diet. Following hypercholesterolemia with Dr. Regis Bill.   Past medical history,surgical history, family history and social history were all reviewed and documented in the EPIC chart.  Gynecologic History Patient's last menstrual period was 12/15/2013.  Obstetric History OB History  Gravida Para Term Preterm AB Living  '3 3 3     3  '$ SAB IAB Ectopic Multiple Live Births               # Outcome Date GA Lbr Len/2nd Weight Sex Delivery Anes PTL Lv  3 Term           2 Term           1 Term              ROS: A ROS was performed and pertinent positives and negatives are included in the history. GENERAL: No fevers or chills. HEENT: No change in vision, no earache, sore throat or sinus congestion. NECK: No pain or stiffness. CARDIOVASCULAR: No chest pain or pressure. No palpitations. PULMONARY: No shortness of breath, cough or wheeze. GASTROINTESTINAL: No abdominal pain, nausea, vomiting or diarrhea, melena or bright red blood per rectum. GENITOURINARY: No urinary frequency, urgency, hesitancy or dysuria. MUSCULOSKELETAL: No joint or muscle pain, no back pain, no recent trauma. DERMATOLOGIC: No rash, no itching, no lesions. ENDOCRINE: No polyuria, polydipsia, no heat or cold intolerance. No recent change in weight. HEMATOLOGICAL: No anemia or easy bruising or bleeding. NEUROLOGIC: No headache, seizures,  numbness, tingling or weakness. PSYCHIATRIC: No depression, no loss of interest in normal activity or change in sleep pattern.     Exam:   BP 120/76   Pulse 70   Resp 16   Ht 5' 2.75" (1.594 m)   Wt 138 lb (62.6 kg)   LMP 12/15/2013   BMI 24.64 kg/m   Body mass index is 24.64 kg/m.  General appearance : Well developed well nourished female. No acute distress HEENT: Eyes: no retinal hemorrhage or exudates,  Neck supple, trachea midline, no carotid bruits, no thyroidmegaly Lungs: Clear to auscultation, no rhonchi or wheezes, or rib retractions  Heart: Regular rate and rhythm, no murmurs or gallops Breast:Examined in sitting and supine position were symmetrical in appearance, no palpable masses or tenderness,  no skin retraction, no nipple inversion, no nipple discharge, no skin discoloration, no axillary or supraclavicular lymphadenopathy Abdomen: no palpable masses or tenderness, no rebound or guarding Extremities: no edema or skin discoloration or tenderness  Pelvic: Vulva: Normal             Vagina: No gross lesions or discharge  Cervix: No gross lesions or discharge.  Pap reflex done.  Uterus  RV, normal size, shape and consistency, non-tender and mobile  Adnexa  Without masses or tenderness  Anus: Normal   Assessment/Plan:  61 y.o. female for annual exam  1. Encounter for routine gynecological examination with Papanicolaou smear of cervix Postmenopausal, well on no hormone replacement therapy.  No postmenopausal bleeding.  No pelvic pain.  Abstinent.  Pap Neg 08/2021.  ASCUS/HPV HR Neg in 2021.  Pap reflex today.  Urine and bowel movements normal.  Colono 09/2018 benign polyps, repeat at 5 years.  Breasts normal.  Mammo 10/2021, Rt Dx mammo/US Benign in 11/2021.  Body mass index 24.64.  Good fitness, walking.  BD normal in 12/2017, repeat at 61 yo.  Low Cholesterol diet. Following hypercholesterolemia with Dr. Regis Bill. - Cytology - PAP( [facility])  2. ASCUS of cervix with  negative high risk HPV - Cytology - PAP( [facility])  3. Post-menopausal  Postmenopausal, well on no hormone replacement therapy.  No postmenopausal bleeding.  No pelvic pain.  Abstinent.  BD normal in 12/2017.  Repeat at age 26.  Princess Bruins MD, 8:51 AM 09/13/2022

## 2022-09-14 LAB — CYTOLOGY - PAP: Diagnosis: NEGATIVE

## 2022-09-16 ENCOUNTER — Other Ambulatory Visit: Payer: Self-pay | Admitting: Obstetrics & Gynecology

## 2022-09-16 DIAGNOSIS — Z1231 Encounter for screening mammogram for malignant neoplasm of breast: Secondary | ICD-10-CM

## 2022-10-13 ENCOUNTER — Other Ambulatory Visit (HOSPITAL_COMMUNITY): Payer: Self-pay

## 2022-10-24 ENCOUNTER — Encounter: Payer: Self-pay | Admitting: Internal Medicine

## 2022-10-26 ENCOUNTER — Other Ambulatory Visit: Payer: Self-pay | Admitting: Family

## 2022-10-26 DIAGNOSIS — M25511 Pain in right shoulder: Secondary | ICD-10-CM

## 2022-11-03 ENCOUNTER — Ambulatory Visit (HOSPITAL_COMMUNITY): Payer: 59

## 2022-11-04 ENCOUNTER — Ambulatory Visit: Payer: 59

## 2022-11-10 ENCOUNTER — Ambulatory Visit (HOSPITAL_COMMUNITY)
Admission: RE | Admit: 2022-11-10 | Discharge: 2022-11-10 | Disposition: A | Discharge status: Home / Self Care | Payer: 59 | Source: Ambulatory Visit | Attending: Family | Admitting: Family

## 2022-11-10 DIAGNOSIS — S46011A Strain of muscle(s) and tendon(s) of the rotator cuff of right shoulder, initial encounter: Secondary | ICD-10-CM | POA: Diagnosis not present

## 2022-11-10 DIAGNOSIS — M25511 Pain in right shoulder: Secondary | ICD-10-CM | POA: Diagnosis not present

## 2022-11-11 ENCOUNTER — Ambulatory Visit
Admission: RE | Admit: 2022-11-11 | Discharge: 2022-11-11 | Disposition: A | Discharge status: Home / Self Care | Payer: 59 | Source: Ambulatory Visit | Attending: Obstetrics & Gynecology | Admitting: Obstetrics & Gynecology

## 2022-11-11 DIAGNOSIS — Z1231 Encounter for screening mammogram for malignant neoplasm of breast: Secondary | ICD-10-CM | POA: Diagnosis not present

## 2022-11-15 ENCOUNTER — Other Ambulatory Visit: Payer: Self-pay | Admitting: Family

## 2022-11-15 ENCOUNTER — Encounter: Payer: Self-pay | Admitting: Internal Medicine

## 2022-11-15 DIAGNOSIS — M25511 Pain in right shoulder: Secondary | ICD-10-CM

## 2022-11-16 ENCOUNTER — Ambulatory Visit: Payer: 59

## 2022-11-18 ENCOUNTER — Encounter: Payer: Self-pay | Admitting: Internal Medicine

## 2022-11-20 DIAGNOSIS — H5213 Myopia, bilateral: Secondary | ICD-10-CM | POA: Diagnosis not present

## 2022-11-21 ENCOUNTER — Other Ambulatory Visit: Payer: Self-pay

## 2022-12-13 ENCOUNTER — Ambulatory Visit (INDEPENDENT_AMBULATORY_CARE_PROVIDER_SITE_OTHER): Payer: 59 | Admitting: Orthopaedic Surgery

## 2022-12-13 ENCOUNTER — Encounter: Payer: Self-pay | Admitting: Orthopaedic Surgery

## 2022-12-13 DIAGNOSIS — M25511 Pain in right shoulder: Secondary | ICD-10-CM

## 2022-12-13 DIAGNOSIS — G8929 Other chronic pain: Secondary | ICD-10-CM | POA: Diagnosis not present

## 2022-12-13 NOTE — Progress Notes (Signed)
Office Visit Note   Patient: Sharon Wells           Date of Birth: [birth year]           MRN: [id number] Visit Date: 12/13/2022              Requested by: Kennyth Arnold, [place],  [place] [address] PCP: Burnis Medin, MD   Assessment & Plan: Visit Diagnoses:  1. Chronic right shoulder pain     Plan: MRI findings showed mainly articular surface rotator cuff tear of the supraspinatus and infraspinatus.  No full-thickness tears were identified.  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  Based on findings I have recommended intra-articular glenohumeral injection and resuming physical therapy once she starts to feel relief.  We will place an order for the injection with Dr. Rolena Infante.  We will see her back in a couple months if symptoms persist or if she does not feel any improvement.  Follow-Up Instructions: No follow-ups on file.   Orders:  Orders Placed This Encounter  Procedures   AMB referral to sports medicine   No orders of the defined types were placed in this encounter.     Procedures: No procedures performed   Clinical Data: No additional findings.   Subjective: Chief Complaint  Patient presents with   Right Shoulder - Pain    HPI Ms. Biever is a very pleasant 62 year old female who is here for evaluation of chronic right shoulder pain for about a year.  She did have an injury about a year ago when she had a mechanical fall.  She feels decreased range of motion and a pulling sensation she.  Right-hand-dominant.  Denies any nighttime pain.  She so far she has tried physical therapy and home exercises and over-the-counter medications.  She has had an MRI that showed partial-thickness rotator cuff tear and other age-appropriate degenerative changes.  Review of Systems  Constitutional: Negative.   HENT: Negative.    Eyes: Negative.   Respiratory: Negative.    Cardiovascular: Negative.    Endocrine: Negative.   Musculoskeletal: Negative.   Neurological: Negative.   Hematological: Negative.   Psychiatric/Behavioral: Negative.    All other systems reviewed and are negative.    Objective: Vital Signs: LMP 12/15/2013   Physical Exam Vitals and nursing note reviewed.  Constitutional:      Appearance: She is well-developed.  HENT:     Head: Normocephalic and atraumatic.  Pulmonary:     Effort: Pulmonary effort is normal.  Abdominal:     Palpations: Abdomen is soft.  Musculoskeletal:     Cervical back: Neck supple.  Skin:    General: Skin is warm.     Capillary Refill: Capillary refill takes less than 2 seconds.  Neurological:     Mental Status: She is alert and oriented to person, place, and time.  Psychiatric:        Behavior: Behavior normal.        Thought Content: Thought content normal.        Judgment: Judgment normal.     Ortho Exam Examination of the right shoulder shows mild to moderate limitation in range of motion in all planes.  Moderate pain with impingement testing.  Rotator cuff strength is normal to manual muscle testing with some slight limitation due to pain. Cervical spine exam is unremarkable. Specialty Comments:  No specialty comments available.  Imaging: No results  found.   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   Change in bowel habits 01/13/2021   Flatulence 01/13/2021   Belching 01/13/2021   Essential hypertension 05/02/2017   Adverse reaction to vaccine 09/03/2014   Elevated blood pressure 09/03/2014   Other specified anemias 07/16/2014   Preventive measure 07/16/2014   Past Medical History:  Diagnosis Date   Chicken pox    Fibroids    With history of anemia   High cholesterol    Hyperlipidemia     Family History  Problem Relation Age of Onset   Sarcoidosis Mother         Deceased in her 36s   Hypertension Sister    Diabetes Sister    Sarcoidosis Sister    COPD Sister        Emphysema   Pulmonary embolism Sister        Apparently am provoked   Heart disease Maternal Grandmother    Breast cancer Neg Hx    Colon cancer Neg Hx    Esophageal cancer Neg Hx    Stomach cancer Neg Hx    Rectal cancer Neg Hx     Past Surgical History:  Procedure Laterality Date   COLONOSCOPY  2013   In [place]   NO PAST SURGERIES     Social History   Occupational History   Not on file  Tobacco Use   Smoking status: Never   Smokeless tobacco: Never  Vaping Use   Vaping Use: Never used  Substance and Sexual Activity   Alcohol use: No   Drug use: No   Sexual activity: Not Currently    Partners: Male    Birth control/protection: Post-menopausal    Comment: 1ST intercourse- 15, partners- 2,

## 2022-12-14 ENCOUNTER — Telehealth: Payer: Self-pay | Admitting: Sports Medicine

## 2023-01-02 ENCOUNTER — Ambulatory Visit: Payer: 59 | Admitting: Family Medicine

## 2023-01-02 ENCOUNTER — Encounter (HOSPITAL_COMMUNITY): Payer: Self-pay | Admitting: Emergency Medicine

## 2023-01-02 ENCOUNTER — Ambulatory Visit (HOSPITAL_COMMUNITY)
Admission: EM | Admit: 2023-01-02 | Discharge: 2023-01-02 | Disposition: A | Discharge status: Home / Self Care | Payer: 59 | Attending: Emergency Medicine | Admitting: Emergency Medicine

## 2023-01-02 DIAGNOSIS — U071 COVID-19: Secondary | ICD-10-CM | POA: Diagnosis not present

## 2023-01-02 DIAGNOSIS — J069 Acute upper respiratory infection, unspecified: Secondary | ICD-10-CM

## 2023-01-02 LAB — POC INFLUENZA A AND B ANTIGEN (URGENT CARE ONLY)
INFLUENZA A ANTIGEN, POC: NEGATIVE
INFLUENZA B ANTIGEN, POC: NEGATIVE

## 2023-01-02 LAB — SARS CORONAVIRUS 2 (TAT 6-24 HRS): SARS Coronavirus 2: POSITIVE — AB

## 2023-01-02 LAB — POCT RAPID STREP A, ED / UC: Rapid Strep A Screen: NEGATIVE

## 2023-01-02 NOTE — ED Triage Notes (Signed)
Pt is here for body aches, chills, runny nose, cough, feeling tired x 1day

## 2023-01-02 NOTE — Discharge Instructions (Addendum)
Flu and strep tests negative. We are sending out COVID test and will contact you in the next day with results. Continue with OTC medicine, rest, and keep hydrated. Follow-up with PCP if no improvement in a week. Go to the ER if develop difficulty breathing, chest pain, or other new concerning symptoms.

## 2023-01-02 NOTE — ED Provider Notes (Addendum)
[name]    CSN: [id number] Arrival date & time: 01/02/23  0945      History   Chief Complaint Chief Complaint  Patient presents with   Cough   Headache    HPI Sharon Wells is a 62 y.o. female.   Patient presents with concerns of feeling unwell since yesterday. She states she developed a mild cough and headache yesterday but this morning has developed body aches, fatigue, rhinorrhea, sore throat, right ear discomfort, and tachycardia along with the cough and headache. The cough is mildly productive. She has had some chills but no fever that she's aware of - up to 99.60F. She denies n/v/d or difficulty breathing. 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 She took Tylenol yesterday but nothing today.  The history is provided by the patient.  Cough Associated symptoms: chills, headaches, myalgias, rhinorrhea and sore throat   Associated symptoms: no fever, no rash and no shortness of breath   Headache Associated symptoms: cough, fatigue, myalgias and sore throat   Associated symptoms: no congestion, no diarrhea, no dizziness, no fever, no nausea and no vomiting     Past Medical History:  Diagnosis Date   Chicken pox    Fibroids    With history of anemia   High cholesterol    Hyperlipidemia     Patient Active Problem List   Diagnosis Date Noted   Hyperlipidemia 12/08/2021   Elevated coronary artery calcium score 12/08/2021   Elevated Lp(a) 12/08/2021   Change in bowel habits 01/13/2021   Flatulence 01/13/2021   Belching 01/13/2021   Essential hypertension 05/02/2017   Adverse reaction to vaccine 09/03/2014   Elevated blood pressure 09/03/2014   Other specified anemias 07/16/2014   Preventive measure 07/16/2014    Past Surgical History:  Procedure Laterality Date   COLONOSCOPY  2013   In [place]   NO PAST SURGERIES      OB History     Gravida  3   Para  3   Term  3   Preterm      AB       Living  3      SAB      IAB      Ectopic      Multiple      Live Births               Home Medications    Prior to Admission medications   Medication Sig Start Date End Date Taking? Authorizing Provider  Ascorbic Acid (VITA-C PO) Take by mouth.   Yes [provider]  cyanocobalamin 2000 MCG tablet Take 2,000 mcg by mouth daily.   Yes [provider]  NON FORMULARY daily. [employer]: Multivitamin Complex with TRE-EN-EN Grain Concentrates   Yes [provider]  rosuvastatin (CRESTOR) 20 MG tablet Take 1 tablet (20 mg total) by mouth daily. 07/25/22  Yes Fay Records, MD    Family History Family History  Problem Relation Age of Onset   Sarcoidosis Mother        Deceased in her 48s   Hypertension Sister    Diabetes Sister    Sarcoidosis Sister    COPD Sister        Emphysema   Pulmonary embolism Sister        Apparently am provoked   Heart disease Maternal Grandmother    Breast cancer Neg Hx  Colon cancer Neg Hx    Esophageal cancer Neg Hx    Stomach cancer Neg Hx    Rectal cancer Neg Hx     Social History Social History   Tobacco Use   Smoking status: Never   Smokeless tobacco: Never  Vaping Use   Vaping Use: Never used  Substance Use Topics   Alcohol use: No   Drug use: No     Allergies   Influenza vaccines and Eggs or egg-derived products   Review of Systems Review of Systems  Constitutional:  Positive for chills and fatigue. Negative for fever.  HENT:  Positive for rhinorrhea and sore throat. Negative for congestion.   Respiratory:  Positive for cough. Negative for shortness of breath.   Gastrointestinal:  Negative for diarrhea, nausea and vomiting.  Musculoskeletal:  Positive for myalgias.  Skin:  Negative for rash.  Neurological:  Positive for headaches. Negative for dizziness.     Physical Exam Triage Vital Signs ED Triage Vitals  Enc Vitals Group     BP 01/02/23 1029 (!) 173/91     Pulse Rate 01/02/23  1029 (!) 123     Resp 01/02/23 1029 16     Temp 01/02/23 1029 99.8 F (37.7 C)     Temp Source 01/02/23 1029 Oral     SpO2 01/02/23 1029 98 %     Weight --      Height --      Head Circumference --      Peak Flow --      Pain Score 01/02/23 1032 4     Pain Loc --      Pain Edu? --      Excl. in [place]? --    No data found.  Updated Vital Signs BP (!) 173/91 (BP Location: Left Arm)   Pulse (!) 123   Temp 99.8 F (37.7 C) (Oral)   Resp 16   LMP 12/15/2013   SpO2 98%   Visual Acuity Right Eye Distance:   Left Eye Distance:   Bilateral Distance:    Right Eye Near:   Left Eye Near:    Bilateral Near:     Physical Exam Vitals and nursing note reviewed.  Constitutional:      General: She is not in acute distress. HENT:     Head: Normocephalic.     Right Ear: Tympanic membrane, ear canal and external ear normal.     Left Ear: Tympanic membrane, ear canal and external ear normal.     Nose: Rhinorrhea present.     Mouth/Throat:     Mouth: Mucous membranes are moist.     Pharynx: Oropharynx is clear. Posterior oropharyngeal erythema present. No oropharyngeal exudate.  Eyes:     Conjunctiva/sclera: Conjunctivae normal.     Pupils: Pupils are equal, round, and reactive to light.  Cardiovascular:     Rate and Rhythm: Regular rhythm. Tachycardia present.     Heart sounds: Normal heart sounds.  Pulmonary:     Effort: Pulmonary effort is normal. No respiratory distress.     Breath sounds: Normal breath sounds.  Musculoskeletal:     Cervical back: Normal range of motion.  Lymphadenopathy:     Cervical: No cervical adenopathy.  Skin:    Findings: No rash.  Neurological:     Mental Status: She is alert.  Psychiatric:        Mood and Affect: Mood normal.      UC Treatments / Results  Labs (all labs ordered  are listed, but only abnormal results are displayed) Labs Reviewed  SARS CORONAVIRUS 2 (TAT 6-24 HRS)  POC INFLUENZA A AND B ANTIGEN (URGENT CARE ONLY)  POCT  RAPID STREP A, ED / UC    EKG   Radiology No results found.  Procedures Procedures (including critical care time)  Medications Ordered in UC Medications - No data to display  Initial Impression / Assessment and Plan / UC Course  I have reviewed the triage vital signs and the nursing notes.  Pertinent labs & imaging results that were available during my care of the patient were reviewed by me and considered in my medical decision making (see chart for details).     Neg flu and strep. COVID send out. Likely viral URI - encouraged continued sx tx. 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 Tachycardia secondary to illness and no indication of concerning arrhythmia.   E/M: 1 acute uncomplicated illness, 3 data (COVID, flu, strep), low risk   Final Clinical Impressions(s) / UC Diagnoses   Final diagnoses:  Viral URI     Discharge Instructions      Flu and strep tests negative. We are sending out COVID test and will contact you in the next day with results. Continue with OTC medicine, rest, and keep hydrated. Follow-up with PCP if no improvement in a week. Go to the ER if develop difficulty breathing, chest pain, or other new concerning symptoms.      ED Prescriptions   None    PDMP not reviewed this encounter.   Delsa Sale, PA 01/02/23 1148    Delsa Sale, [place] 01/02/23 1210    Delsa Sale, [place] 01/02/23 [phone]

## 2023-01-04 ENCOUNTER — Ambulatory Visit: Payer: 59 | Admitting: Sports Medicine

## 2023-03-21 ENCOUNTER — Encounter: Payer: 59 | Admitting: Internal Medicine

## 2023-04-12 NOTE — Progress Notes (Unsigned)
No chief complaint on file.   HPI: Patient  Sharon Wells  61 y.o. comes in today for Preventive Health Care visit   Health Maintenance  Topic Date Due   HIV Screening  Never done   Hepatitis C Screening  Never done   Zoster Vaccines- Shingrix (1 of 2) Never done   COVID-19 Vaccine (3 - Pfizer risk series) 08/31/2020   INFLUENZA VACCINE  09/01/2049 (Originally 06/15/2023)   DTaP/Tdap/Td (2 - Td or Tdap) 05/08/2023   Colonoscopy  10/04/2023   MAMMOGRAM  11/11/2024   PAP SMEAR-Modifier  09/13/2025   HPV VACCINES  Aged Out   Health Maintenance Review LIFESTYLE:  Exercise:   Tobacco/ETS: Alcohol:  Sugar beverages: Sleep: Drug use: no HH of  Work:    ROS:  GEN/ HEENT: No fever, significant weight changes sweats headaches vision problems hearing changes, CV/ PULM; No chest pain shortness of breath cough, syncope,edema  change in exercise tolerance. GI /GU: No adominal pain, vomiting, change in bowel habits. No blood in the stool. No significant GU symptoms. SKIN/HEME: ,no acute skin rashes suspicious lesions or bleeding. No lymphadenopathy, nodules, masses.  NEURO/ PSYCH:  No neurologic signs such as weakness numbness. No depression anxiety. IMM/ Allergy: No unusual infections.  Allergy .   REST of 12 system review negative except as per HPI   Past Medical History:  Diagnosis Date   Chicken pox    Fibroids    With history of anemia   High cholesterol    Hyperlipidemia     Past Surgical History:  Procedure Laterality Date   COLONOSCOPY  2013   In [place]   NO PAST SURGERIES      Family History  Problem Relation Age of Onset   Sarcoidosis Mother        Deceased in her 44s   Hypertension Sister    Diabetes Sister    Sarcoidosis Sister    COPD Sister        Emphysema   Pulmonary embolism Sister        Apparently am provoked   Heart disease Maternal Grandmother    Breast cancer Neg Hx    Colon cancer Neg Hx    Esophageal cancer Neg Hx    Stomach  cancer Neg Hx    Rectal cancer Neg Hx     Social History   Socioeconomic History   Marital status: Divorced    Spouse name: Not on file   Number of children: Not on file   Years of education: Not on file   Highest education level: Not on file  Occupational History   Not on file  Tobacco Use   Smoking status: Never   Smokeless tobacco: Never  Vaping Use   Vaping Use: Never used  Substance and Sexual Activity   Alcohol use: No   Drug use: No   Sexual activity: Not Currently    Partners: Male    Birth control/protection: Post-menopausal    Comment: 1ST intercourse- 15, partners- 2,   Other Topics Concern   Not on file  Social History Narrative   4-5 hours of sleep per night   Lives Alone separated    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   Social Determinants of Health   Financial Resource Strain: Not on file  Food Insecurity: Not on file  Transportation Needs: Not on file  Physical Activity: Not on file  Stress: Not on file  Social Connections: Not on file    Outpatient Medications Prior to Visit  Medication Sig Dispense Refill   Ascorbic Acid (VITA-C PO) Take by mouth.     cyanocobalamin 2000 MCG tablet Take 2,000 mcg by mouth daily.     NON FORMULARY daily. [employer]: Multivitamin Complex with TRE-EN-EN Grain Concentrates     rosuvastatin (CRESTOR) 20 MG tablet Take 1 tablet (20 mg total) by mouth daily. 90 tablet 3   No facility-administered medications prior to visit.     EXAM:  LMP 12/15/2013   There is no height or weight on file to calculate BMI. Wt Readings from Last 3 Encounters:  09/13/22 138 lb (62.6 kg)  09/01/22 138 lb (62.6 kg)  05/06/22 142 lb (64.4 kg)    Physical Exam: Vital signs reviewed YNW:GNFA is a well-developed  well-nourished alert cooperative    who appearsr stated age in no acute distress.  HEENT: normocephalic atraumatic , Eyes: PERRL EOM's full, conjunctiva clear, Nares: paten,t no deformity discharge or tenderness., Ears: no deformity EAC's clear TMs with normal landmarks. Mouth: clear OP, no lesions, edema.  Moist mucous membranes. Dentition in adequate repair. NECK: supple without masses, thyromegaly or bruits. CHEST/PULM:  Clear to auscultation and percussion breath sounds equal no wheeze , rales or rhonchi. No chest wall deformities or tenderness. Breast: normal by inspection . No dimpling, discharge, masses, tenderness or discharge . CV: PMI is nondisplaced, S1 S2 no gallops, murmurs, rubs. Peripheral pulses are full without delay.No JVD .  ABDOMEN: Bowel sounds normal nontender  No guard or rebound, no hepato splenomegal no CVA tenderness.  No hernia. Extremtities:  No clubbing cyanosis or edema, no acute joint swelling or redness no focal atrophy NEURO:  Oriented x3, cranial nerves 3-12 appear to be intact, no obvious focal weakness,gait within normal limits no abnormal reflexes or asymmetrical SKIN: No acute rashes normal turgor, color, no bruising or petechiae. PSYCH: Oriented, good eye contact, no obvious depression anxiety, cognition and judgment appear normal. LN: no cervical axillary inguinal adenopathy  Lab Results  Component Value Date   WBC 5.4 12/21/2021   HGB 12.1 12/21/2021   HCT 37.2 12/21/2021   PLT 258 12/21/2021   GLUCOSE 108 (H) 12/21/2021   CHOL 142 02/01/2022   TRIG 84 02/01/2022   HDL 54 02/01/2022   LDLCALC 72 02/01/2022   ALT 20 11/02/2021   AST 24 11/02/2021   NA 138 12/21/2021   K 3.8 12/21/2021   CL 102 12/21/2021   CREATININE 0.57 12/21/2021   BUN 12 12/21/2021   CO2 26 12/21/2021   TSH 0.94 01/13/2021   HGBA1C 5.7 03/03/2020    BP Readings from Last 3 Encounters:  01/02/23 (!) 173/91  09/13/22 120/76  09/01/22 130/80    Lab results reviewed  with patient   ASSESSMENT AND PLAN:  Discussed the following assessment and plan:    ICD-10-CM   1. Elevated coronary artery calcium score  R93.1     2. Elevated Lp(a)  E78.41     3. Visit for preventive health examination  Z00.00     4. Medication management  Z79.899      No follow-ups on file.  Patient Care Team: Amaiyah Nordhoff, Neta Mends, MD as PCP - General (Internal Medicine) Pricilla Riffle, MD  as PCP - Cardiology (Cardiology) Genia Del, MD as Consulting Physician (Obstetrics and Gynecology) Rhea Belton, Carie Caddy, MD as Consulting Physician (Gastroenterology) There are no Patient Instructions on file for this visit.  Neta Mends. Krue Peterka M.D.

## 2023-04-13 ENCOUNTER — Encounter: Payer: Self-pay | Admitting: Internal Medicine

## 2023-04-13 ENCOUNTER — Ambulatory Visit (INDEPENDENT_AMBULATORY_CARE_PROVIDER_SITE_OTHER): Payer: 59 | Admitting: Internal Medicine

## 2023-04-13 VITALS — BP 121/73 | HR 73 | Temp 98.1°F | Ht 62.98 in | Wt 134.6 lb

## 2023-04-13 DIAGNOSIS — Z1159 Encounter for screening for other viral diseases: Secondary | ICD-10-CM

## 2023-04-13 DIAGNOSIS — R931 Abnormal findings on diagnostic imaging of heart and coronary circulation: Secondary | ICD-10-CM

## 2023-04-13 DIAGNOSIS — Z79899 Other long term (current) drug therapy: Secondary | ICD-10-CM | POA: Diagnosis not present

## 2023-04-13 DIAGNOSIS — E7841 Elevated Lipoprotein(a): Secondary | ICD-10-CM

## 2023-04-13 DIAGNOSIS — Z0000 Encounter for general adult medical examination without abnormal findings: Secondary | ICD-10-CM | POA: Diagnosis not present

## 2023-04-13 LAB — CBC WITH DIFFERENTIAL/PLATELET
Basophils Absolute: 0 10*3/uL (ref 0.0–0.1)
Basophils Relative: 1.2 % (ref 0.0–3.0)
Eosinophils Absolute: 0 10*3/uL (ref 0.0–0.7)
Eosinophils Relative: 1.3 % (ref 0.0–5.0)
HCT: 38.5 % (ref 36.0–46.0)
Hemoglobin: 12.5 g/dL (ref 12.0–15.0)
Lymphocytes Relative: 50.3 % — ABNORMAL HIGH (ref 12.0–46.0)
Lymphs Abs: 1.5 10*3/uL (ref 0.7–4.0)
MCHC: 32.5 g/dL (ref 30.0–36.0)
MCV: 86.9 fl (ref 78.0–100.0)
Monocytes Absolute: 0.2 10*3/uL (ref 0.1–1.0)
Monocytes Relative: 5.5 % (ref 3.0–12.0)
Neutro Abs: 1.2 10*3/uL — ABNORMAL LOW (ref 1.4–7.7)
Neutrophils Relative %: 41.7 % — ABNORMAL LOW (ref 43.0–77.0)
Platelets: 255 10*3/uL (ref 150.0–400.0)
RBC: 4.43 Mil/uL (ref 3.87–5.11)
RDW: 12.8 % (ref 11.5–15.5)
WBC: 2.9 10*3/uL — ABNORMAL LOW (ref 4.0–10.5)

## 2023-04-13 LAB — LIPID PANEL
Cholesterol: 137 mg/dL (ref 0–200)
HDL: 52.9 mg/dL (ref >39.00)
LDL Cholesterol: 66 mg/dL (ref 0–99)
NonHDL: 84.12
Total CHOL/HDL Ratio: 3
Triglycerides: 89 mg/dL (ref 0.0–149.0)
VLDL: 17.8 mg/dL (ref 0.0–40.0)

## 2023-04-13 LAB — COMPREHENSIVE METABOLIC PANEL
ALT: 19 U/L (ref 0–35)
AST: 23 U/L (ref 0–37)
Albumin: 4.8 g/dL (ref 3.5–5.2)
Alkaline Phosphatase: 64 U/L (ref 39–117)
BUN: 10 mg/dL (ref 6–23)
CO2: 29 mEq/L (ref 19–32)
Calcium: 9.8 mg/dL (ref 8.4–10.5)
Chloride: 102 mEq/L (ref 96–112)
Creatinine, Ser: 0.65 mg/dL (ref 0.40–1.20)
GFR: 93.66 mL/min (ref >60.00)
Glucose, Bld: 95 mg/dL (ref 70–99)
Potassium: 4.4 mEq/L (ref 3.5–5.1)
Sodium: 140 mEq/L (ref 135–145)
Total Bilirubin: 0.7 mg/dL (ref 0.2–1.2)
Total Protein: 7.9 g/dL (ref 6.0–8.3)

## 2023-04-13 LAB — TSH: TSH: 1.18 u[IU]/mL (ref 0.35–5.50)

## 2023-04-13 LAB — HEMOGLOBIN A1C: Hgb A1c MFr Bld: 6 % (ref 4.6–6.5)

## 2023-04-13 NOTE — Progress Notes (Signed)
Results are stable  x wbc a bit lower than past but may be normal for you  LDL  at goal A1c 6  no DM  in prediabetic range rest of lab normal  Continue lifestyle intervention healthy eating and exercise . And  medication  Suggest repeat cbcdiff in 4-6 months to be sure  wbc stable  no ov needed unless having a problem

## 2023-04-13 NOTE — Patient Instructions (Addendum)
Good to see you today . Fasting lab  Try taking crestor earlier and then maybe famotidine at night to see if helps the am GI sx  Continue attention  lifestyle intervention healthy eating and exercise .  Add resistance exercise for upper body bone health a few times a week.

## 2023-04-14 ENCOUNTER — Other Ambulatory Visit: Payer: Self-pay

## 2023-04-14 DIAGNOSIS — Z79899 Other long term (current) drug therapy: Secondary | ICD-10-CM

## 2023-04-14 DIAGNOSIS — I10 Essential (primary) hypertension: Secondary | ICD-10-CM

## 2023-04-14 LAB — HEPATITIS C ANTIBODY: Hepatitis C Ab: NONREACTIVE

## 2023-04-19 ENCOUNTER — Other Ambulatory Visit: Payer: Self-pay

## 2023-04-20 ENCOUNTER — Encounter: Payer: Self-pay | Admitting: Internal Medicine

## 2023-04-25 ENCOUNTER — Other Ambulatory Visit (HOSPITAL_COMMUNITY): Payer: Self-pay

## 2023-04-26 ENCOUNTER — Other Ambulatory Visit (HOSPITAL_COMMUNITY): Payer: Self-pay

## 2023-06-16 ENCOUNTER — Encounter: Payer: Self-pay | Admitting: Internal Medicine

## 2023-06-27 ENCOUNTER — Encounter: Payer: Self-pay | Admitting: Internal Medicine

## 2023-06-28 NOTE — Telephone Encounter (Signed)
Stay off Crestor for now     Communicate back in a 3 to 4 wks    Confirm symptoms  of flatulence remain gone.   Will think about other medicines at that time

## 2023-08-01 ENCOUNTER — Ambulatory Visit: Payer: 59 | Admitting: Internal Medicine

## 2023-08-01 ENCOUNTER — Encounter: Payer: Self-pay | Admitting: Internal Medicine

## 2023-08-01 VITALS — BP 154/82 | HR 68 | Temp 98.5°F | Ht 62.98 in | Wt 135.4 lb

## 2023-08-01 DIAGNOSIS — R1013 Epigastric pain: Secondary | ICD-10-CM | POA: Diagnosis not present

## 2023-08-01 DIAGNOSIS — R194 Change in bowel habit: Secondary | ICD-10-CM

## 2023-08-01 DIAGNOSIS — Z79899 Other long term (current) drug therapy: Secondary | ICD-10-CM | POA: Diagnosis not present

## 2023-08-01 DIAGNOSIS — R030 Elevated blood-pressure reading, without diagnosis of hypertension: Secondary | ICD-10-CM

## 2023-08-01 DIAGNOSIS — R141 Gas pain: Secondary | ICD-10-CM

## 2023-08-01 NOTE — Patient Instructions (Addendum)
Lets do an abdominal ultrasound .  To check  gall bladder etc.  In interim   ok to do pepcid  once or twice a day for 2 weeks and then as needed.   Ask Gi to see you before colon to see if other evaluation is in order.  And I will  send message to Dr Lauro Franklin  team also about  situation.  Continue to make sure  BP is in range at home.

## 2023-08-01 NOTE — Progress Notes (Signed)
Chief Complaint  Patient presents with   Excess Gas    Pt c/o excess gas. Going on couple of months. Talk to her cardiologist, was told to hold off on crestor for 30 days. Sx gotten better. Resume to crestor.     HPI: Sharon Wells 62 y.o. come in for  on going GI sx   Gi sx  located up mid line  and then to back  pain   so wonders if could be  crestor.  Gas feeling better with burp    and tried simethicone  Had excess Gi gurgling and acid like and then took pepcid and help some  but then tried bid and had constipation despite hydration.  Now better .stopped the rosuvastatin  20  temp ? If better but had been talking for over a years  now is back on   trial pepcid every day and then bid which helped but caused constipation  To have colon in November.pepcid  q d . Wonders if should have endo also ?  Bp /HT: was  ok at home  monitors  ROS: See pertinent positives and negatives per HPI. No current cp sob  unintended weight loss   Past Medical History:  Diagnosis Date   Chicken pox    Fibroids    With history of anemia   High cholesterol    Hyperlipidemia     Family History  Problem Relation Age of Onset   Sarcoidosis Mother        Deceased in her 63s   Hypertension Sister    Diabetes Sister    Sarcoidosis Sister    COPD Sister        Emphysema   Pulmonary embolism Sister        Apparently am provoked   Heart disease Maternal Grandmother    Breast cancer Neg Hx    Colon cancer Neg Hx    Esophageal cancer Neg Hx    Stomach cancer Neg Hx    Rectal cancer Neg Hx     Social History   Socioeconomic History   Marital status: Divorced    Spouse name: Not on file   Number of children: Not on file   Years of education: Not on file   Highest education level: Not on file  Occupational History   Not on file  Tobacco Use   Smoking status: Never   Smokeless tobacco: Never  Vaping Use   Vaping status: Never Used  Substance and Sexual Activity   Alcohol use: No   Drug  use: No   Sexual activity: Not Currently    Partners: Male    Birth control/protection: Post-menopausal    Comment: 1ST intercourse- 15, partners- 2,   Other Topics Concern   Not on file  Social History Narrative   4-5 hours of sleep per night   Lives Alone separated    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   Social Determinants of Health   Financial Resource Strain: Low Risk  (04/13/2023)   Overall Financial Resource Strain (CARDIA)    Difficulty of Paying Living Expenses: Not hard at all  Food Insecurity: No Food Insecurity (04/13/2023)   Hunger Vital Sign  Worried About Programme researcher, broadcasting/film/video in the Last Year: Never true    Ran Out of Food in the Last Year: Never true  Transportation Needs: No Transportation Needs (04/13/2023)   PRAPARE - Administrator, Civil Service (Medical): No    Lack of Transportation (Non-Medical): No  Physical Activity: Sufficiently Active (04/13/2023)   Exercise Vital Sign    Days of Exercise per Week: 4 days    Minutes of Exercise per Session: 40 min  Stress: No Stress Concern Present (04/13/2023)   [employer] of Occupational Health - Occupational Stress Questionnaire    Feeling of Stress : Not at all  Social Connections: Moderately Integrated (04/13/2023)   Social Connection and Isolation Panel [NHANES]    Frequency of Communication with Friends and Family: More than three times a week    Frequency of Social Gatherings with Friends and Family: Once a week    Attends Religious Services: More than 4 times per year    Active Member of [employer] or Organizations: Yes    Attends Engineer, structural: More than 4 times per year    Marital Status: Divorced    Outpatient Medications Prior to Visit  Medication Sig Dispense  Refill   Ascorbic Acid (VITA-C PO) Take by mouth.     cyanocobalamin 2000 MCG tablet Take 2,000 mcg by mouth daily.     NON FORMULARY daily. NeoLife: Multivitamin Complex with TRE-EN-EN Grain Concentrates     rosuvastatin (CRESTOR) 20 MG tablet Take 20 mg by mouth daily.     No facility-administered medications prior to visit.     EXAM:  BP (!) 154/82 (BP Location: Left Arm, Cuff Size: Normal)   Pulse 68   Temp 98.5 F (36.9 C) (Oral)   Ht 5' 2.98" (1.6 m)   Wt 135 lb 6.4 oz (61.4 kg)   LMP 12/15/2013   SpO2 97%   BMI 24.00 kg/m   Body mass index is 24 kg/m.  GENERAL: vitals reviewed and listed above, alert, oriented, appears well hydrated and in no acute distress HEENT: atraumatic, conjunctiva  clear, no obvious abnormalities on inspection of external nose and ears masked  NECK: no obvious masses on inspection palpation  LUNGS: clear to auscultation bilaterally, no wheezes, rales or rhonchi, good air movement CV: HRRR, no clubbing cyanosis or  peripheral edema nl cap refill  Abdomen:  Sof,t normal bowel sounds without hepatosplenomegaly, no guarding rebound or masses no CVA tenderness MS: moves all extremities without noticeable focal  abnormality PSYCH: pleasant and cooperative, no obvious depression or anxiety Lab Results  Component Value Date   WBC 2.9 (L) 04/13/2023   HGB 12.5 04/13/2023   HCT 38.5 04/13/2023   PLT 255.0 04/13/2023   GLUCOSE 95 04/13/2023   CHOL 137 04/13/2023   TRIG 89.0 04/13/2023   HDL 52.90 04/13/2023   LDLCALC 66 04/13/2023   ALT 19 04/13/2023   AST 23 04/13/2023   NA 140 04/13/2023   K 4.4 04/13/2023   CL 102 04/13/2023   CREATININE 0.65 04/13/2023   BUN 10 04/13/2023   CO2 29 04/13/2023   TSH 1.18 04/13/2023   HGBA1C 6.0 04/13/2023   BP Readings from Last 3 Encounters:  08/01/23 (!) 154/82  04/13/23 121/73  01/02/23 (!) 173/91   Wt Readings from Last 3 Encounters:  08/01/23 135 lb 6.4 oz (61.4 kg)  04/13/23 134 lb 9.6 oz  (61.1 kg)  09/13/22 138 lb (62.6 kg)  Record review   ASSESSMENT AND  PLAN:  Discussed the following assessment and plan:  Abdominal discomfort, epigastric - Plan: US Abdomen Complete  Gas pain - Plan: US Abdomen Complete  Change in bowel habits - Plan: US Abdomen Complete  Medication management  Elevated blood pressure reading GI sx  last colon 2019  Sx seem to respond to pepcid  doubt if from crestro since  not a new med  -Patient advised to return or notify health care team  if  new concerns arise.  Patient Instructions  Lets do an abdominal ultrasound .  To check  gall bladder etc.  In interim   ok to do pepcid  once or twice a day for 2 weeks and then as needed.   Ask Gi to see you before colon to see if other evaluation is in order.  And I will  send message to Dr Lauro Franklin  team also about  situation.  Continue to make sure  BP is in range at home.  Neta Mends. Eldo Umanzor M.D.

## 2023-08-05 ENCOUNTER — Ambulatory Visit (HOSPITAL_COMMUNITY)
Admission: RE | Admit: 2023-08-05 | Discharge: 2023-08-05 | Disposition: A | Discharge status: Home / Self Care | Payer: 59 | Source: Ambulatory Visit | Attending: Internal Medicine | Admitting: Internal Medicine

## 2023-08-05 DIAGNOSIS — R141 Gas pain: Secondary | ICD-10-CM | POA: Diagnosis not present

## 2023-08-05 DIAGNOSIS — R1013 Epigastric pain: Secondary | ICD-10-CM | POA: Diagnosis not present

## 2023-08-05 DIAGNOSIS — R194 Change in bowel habit: Secondary | ICD-10-CM | POA: Diagnosis not present

## 2023-08-05 DIAGNOSIS — R109 Unspecified abdominal pain: Secondary | ICD-10-CM | POA: Diagnosis not present

## 2023-08-06 ENCOUNTER — Other Ambulatory Visit: Payer: Self-pay | Admitting: Internal Medicine

## 2023-08-06 NOTE — Progress Notes (Signed)
US abdomen normal   reassuring  . Fu with GI

## 2023-08-07 ENCOUNTER — Other Ambulatory Visit (HOSPITAL_COMMUNITY): Payer: Self-pay

## 2023-08-07 MED ORDER — ROSUVASTATIN CALCIUM 20 MG PO TABS
20.0000 mg | ORAL_TABLET | Freq: Every day | ORAL | 0 refills | Status: DC
  Filled 2023-08-07: qty 90, 90d supply, fill #0

## 2023-08-08 ENCOUNTER — Other Ambulatory Visit (HOSPITAL_COMMUNITY): Payer: Self-pay

## 2023-08-14 ENCOUNTER — Other Ambulatory Visit: Payer: 59

## 2023-08-14 ENCOUNTER — Ambulatory Visit (INDEPENDENT_AMBULATORY_CARE_PROVIDER_SITE_OTHER): Payer: 59 | Admitting: Podiatry

## 2023-08-14 ENCOUNTER — Encounter: Payer: Self-pay | Admitting: Podiatry

## 2023-08-14 ENCOUNTER — Other Ambulatory Visit: Payer: Self-pay | Admitting: Family

## 2023-08-14 ENCOUNTER — Other Ambulatory Visit (INDEPENDENT_AMBULATORY_CARE_PROVIDER_SITE_OTHER): Payer: 59

## 2023-08-14 ENCOUNTER — Encounter: Payer: Self-pay | Admitting: Internal Medicine

## 2023-08-14 VITALS — BP 186/95 | HR 98

## 2023-08-14 DIAGNOSIS — M2041 Other hammer toe(s) (acquired), right foot: Secondary | ICD-10-CM

## 2023-08-14 DIAGNOSIS — Z79899 Other long term (current) drug therapy: Secondary | ICD-10-CM | POA: Diagnosis not present

## 2023-08-14 DIAGNOSIS — Z6790 Unspecified blood type, Rh positive: Secondary | ICD-10-CM

## 2023-08-14 LAB — CBC WITH DIFFERENTIAL/PLATELET
Basophils Absolute: 0 10*3/uL (ref 0.0–0.1)
Basophils Relative: 0.9 % (ref 0.0–3.0)
Eosinophils Absolute: 0.1 10*3/uL (ref 0.0–0.7)
Eosinophils Relative: 1.8 % (ref 0.0–5.0)
HCT: 39.9 % (ref 36.0–46.0)
Hemoglobin: 12.8 g/dL (ref 12.0–15.0)
Lymphocytes Relative: 45.4 % (ref 12.0–46.0)
Lymphs Abs: 1.6 10*3/uL (ref 0.7–4.0)
MCHC: 32 g/dL (ref 30.0–36.0)
MCV: 86.8 fL (ref 78.0–100.0)
Monocytes Absolute: 0.3 10*3/uL (ref 0.1–1.0)
Monocytes Relative: 7.3 % (ref 3.0–12.0)
Neutro Abs: 1.5 10*3/uL (ref 1.4–7.7)
Neutrophils Relative %: 44.6 % (ref 43.0–77.0)
Platelets: 278 10*3/uL (ref 150.0–400.0)
RBC: 4.6 Mil/uL (ref 3.87–5.11)
RDW: 12.8 % (ref 11.5–15.5)
WBC: 3.5 10*3/uL — ABNORMAL LOW (ref 4.0–10.5)

## 2023-08-14 NOTE — Progress Notes (Signed)
Subjective:   Patient ID: Sharon Wells, female   DOB: 62 y.o.   MRN: [id number]   HPI Patient presents with keratotic lesion digit 5 right that is painful when pressed and states she knows that she needs to get this digit fixed   ROS      Objective:  Physical Exam  Neurovascular status intact negative Denna Haggard' sign noted digit 5 right healed well good alignment noted      Assessment:  Hammertoe deformity digit 5 right with pain     Plan:  H&P reviewed surgical intervention arthroplasty with derotation and removal of bone but at this point organ to continue conservative with trimming technique.  Reappoint to recheck

## 2023-08-15 NOTE — Telephone Encounter (Signed)
So I agree that we need to correlate the bp readings with monitors So we can tell if truly a WC effect of baseline is elevated.   (we see this a lot ).  Karpuih ,see if we can get her a nurse visit to correlate her own  bp monitor and office readings . To record in record and then monitor to see  If it is only up at medical visits .  We can discuss  . At a virtual after a month finding ;monitor pulse also

## 2023-08-15 NOTE — Progress Notes (Signed)
Blood count better  wbc 3.5  .

## 2023-08-16 ENCOUNTER — Encounter: Payer: Self-pay | Admitting: Internal Medicine

## 2023-08-16 NOTE — Telephone Encounter (Signed)
Spoke to pt and inform her of provider's message. 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 She will look at her schedule and let us know when she can come.  Inform pt, we advise pt to schedule on a nurse visit when their pcp is in office and Dr. Fabian Sharp will be out tomorrow till Oct. 17. Verbalized understanding.

## 2023-08-22 ENCOUNTER — Other Ambulatory Visit (HOSPITAL_COMMUNITY): Payer: Self-pay

## 2023-08-22 ENCOUNTER — Ambulatory Visit (INDEPENDENT_AMBULATORY_CARE_PROVIDER_SITE_OTHER): Payer: 59 | Admitting: Physician Assistant

## 2023-08-22 ENCOUNTER — Encounter: Payer: Self-pay | Admitting: Physician Assistant

## 2023-08-22 VITALS — BP 140/80 | HR 102 | Ht 62.0 in | Wt 133.4 lb

## 2023-08-22 DIAGNOSIS — R142 Eructation: Secondary | ICD-10-CM

## 2023-08-22 DIAGNOSIS — Z860100 Personal history of colon polyps, unspecified: Secondary | ICD-10-CM

## 2023-08-22 DIAGNOSIS — K219 Gastro-esophageal reflux disease without esophagitis: Secondary | ICD-10-CM

## 2023-08-22 MED ORDER — PLENVU 140 G PO SOLR
1.0000 | Freq: Once | ORAL | 0 refills | Status: AC
  Filled 2023-08-22 – 2023-10-16 (×2): qty 3, 1d supply, fill #0

## 2023-08-22 NOTE — Patient Instructions (Signed)
Continue Pepcid 20mg  daily  You have been scheduled for an endoscopy and colonoscopy. Please follow the written instructions given to you at your visit today.  Please pick up your prep supplies at the pharmacy within the next 1-3 days.  If you use inhalers (even only as needed), please bring them with you on the day of your procedure.  DO NOT TAKE 7 DAYS PRIOR TO TEST- Trulicity (dulaglutide) Ozempic, Wegovy (semaglutide) Mounjaro (tirzepatide) Bydureon Bcise (exanatide extended release)  DO NOT TAKE 1 DAY PRIOR TO YOUR TEST Rybelsus (semaglutide) Adlyxin (lixisenatide) Victoza (liraglutide) Byetta (exanatide) _______________________________________________________________  _______________________________________________________  If your blood pressure at your visit was 140/90 or greater, please contact your primary care physician to follow up on this.  _______________________________________________________  If you are age 53 or older, your body mass index should be between 23-30. Your Body mass index is 24.4 kg/m. If this is out of the aforementioned range listed, please consider follow up with your Primary Care Provider.  If you are age 64 or younger, your body mass index should be between 19-25. Your Body mass index is 24.4 kg/m. If this is out of the aformentioned range listed, please consider follow up with your Primary Care Provider.   ________________________________________________________  The [facility] providers would like to encourage you to use [facility] to communicate with providers for non-urgent requests or questions.  Due to long hold times on the telephone, sending your provider a message by [facility] may be a faster and more efficient way to get a response.  Please allow 48 business hours for a response.  Please remember that this is for non-urgent requests.  _______________________________________________________

## 2023-08-22 NOTE — Progress Notes (Signed)
Chief Complaint: Epigastric pain  HPI:    Sharon Wells is a 62 year old female with a past medical history as listed below, known to Dr. Rhea Belton, who was referred to me by Panosh, Sharon Mends, MD for a complaint of epigastric pain.      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  Repeat recommended in 5 years given a prior history of precancerous polyps.    01/13/2021 patient seen in clinic for change in bowel habits.  At that time discussed taking Omeprazole on occasion which helped with belching.  Her bowel habit change had really improved and she was told to take a fiber supplement and MiraLAX as needed, TSH was called.    04/13/2023 hemoglobin A1c, TSH, CMP all normal.    08/01/2023 patient saw PCP and described ongoing excessive gas.  Apparently this is better off of Crestor for 30 days but then it was resumed.    08/05/2023 abdominal ultrasound with no etiology found.    08/14/2023 CBC with a white count minimally decreased at 3-1/2 (appears at patient's baseline).    Today, patient presents to clinic and explains that she knows she is due for colonoscopy in November and actually is already scheduled for the 22nd but she wanted to discuss some eructations and what feels like trapped gas and "acid" in her stomach recently.  Apparently this all started a few months ago and she read that Crestor could cause the symptoms so she stopped it for a couple of weeks and the gas actually got better so then she restarted at night and started taking Pepcid 20 mg once a day, this helped with her gas too and it is much better, she even tried increasing to twice a day but this caused constipation so she went back to once a day right before lunch and feels like her symptoms are much better but she does find that she needs this medication.  Recalls in the past she is also occasionally needed things for acid reduction.  Describes that without the Pepcid  she wakes up in the morning and feels like "all my juices are dancing".  Denies overt epigastric discomfort.    Patient works as a Publishing rights manager.    Denies fever, chills or weight loss.  Past Medical History:  Diagnosis Date   Chicken pox    Fibroids    With history of anemia   High cholesterol    Hyperlipidemia     Past Surgical History:  Procedure Laterality Date   COLONOSCOPY  2013   In [place]   NO PAST SURGERIES      Current Outpatient Medications  Medication Sig Dispense Refill   Ascorbic Acid (VITA-C PO) Take by mouth.     cyanocobalamin 2000 MCG tablet Take 2,000 mcg by mouth daily.     NON FORMULARY daily. NeoLife: Multivitamin Complex with TRE-EN-EN Grain Concentrates     rosuvastatin (CRESTOR) 20 MG tablet Take 1 tablet (20 mg total) by mouth daily. 90 tablet 0   No current facility-administered medications for this visit.    Allergies as of 08/22/2023 - Review Complete 08/14/2023  Allergen Reaction Noted   Influenza vaccines Shortness Of Breath 05/25/2021   Egg-derived products Nausea And Vomiting 07/16/2014    Family History  Problem Relation Age of Onset   Sarcoidosis Mother        Deceased in her 28s   Hypertension Sister  Diabetes Sister    Sarcoidosis Sister    COPD Sister        Emphysema   Pulmonary embolism Sister        Apparently am provoked   Heart disease Maternal Grandmother    Breast cancer Neg Hx    Colon cancer Neg Hx    Esophageal cancer Neg Hx    Stomach cancer Neg Hx    Rectal cancer Neg Hx     Social History   Socioeconomic History   Marital status: Divorced    Spouse name: Not on file   Number of children: Not on file   Years of education: Not on file   Highest education level: Not on file  Occupational History   Not on file  Tobacco Use   Smoking status: Never   Smokeless tobacco: Never  Vaping Use   Vaping status: Never Used  Substance and Sexual Activity   Alcohol use: No   Drug use: No   Sexual  activity: Not Currently    Partners: Male    Birth control/protection: Post-menopausal    Comment: 1ST intercourse- 15, partners- 2,   Other Topics Concern   Not on file  Social History Narrative   4-5 hours of sleep per night   Lives Alone separated    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   Social Determinants of Health   Financial Resource Strain: Low Risk  (04/13/2023)   Overall Financial Resource Strain (CARDIA)    Difficulty of Paying Living Expenses: Not hard at all  Food Insecurity: No Food Insecurity (04/13/2023)   Hunger Vital Sign    Worried About Running Out of Food in the Last Year: Never true    Ran Out of Food in the Last Year: Never true  Transportation Needs: No Transportation Needs (04/13/2023)   PRAPARE - Administrator, Civil Service (Medical): No    Lack of Transportation (Non-Medical): No  Physical Activity: Sufficiently Active (04/13/2023)   Exercise Vital Sign    Days of Exercise per Week: 4 days    Minutes of Exercise per Session: 40 min  Stress: No Stress Concern Present (04/13/2023)   Sharon Wells of Occupational Health - Occupational Stress Questionnaire    Feeling of Stress : Not at all  Social Connections: Moderately Integrated (04/13/2023)   Social Connection and Isolation Panel [NHANES]    Frequency of Communication with Friends and Family: More than three times a week    Frequency of Social Gatherings with Friends and Family: Once a week    Attends Religious Services: More than 4 times per year    Active Member of [employer] or Organizations: Yes    Attends Engineer, structural: More than 4 times per year    Marital Status: Divorced  Catering manager Violence: Not At Risk (04/13/2023)   Humiliation, Afraid, Rape, and  Kick questionnaire    Fear of Current or Ex-Partner: No    Emotionally Abused: No    Physically Abused: No    Sexually Abused: No    Review of Systems:    Constitutional: No weight loss, fever or chills Skin: No rash Cardiovascular: No chest pain Respiratory: No SOB  Gastrointestinal: See HPI  and otherwise negative Genitourinary: No dysuria Neurological: No headache, dizziness or syncope Musculoskeletal: No new muscle or joint pain Hematologic: No bleeding  Psychiatric: No history of depression or anxiety   Physical Exam:  Vital signs: BP (!) 140/80   Pulse (!) 102   Ht 5\' 2"  (1.575 m)   Wt 133 lb 6.4 oz (60.5 kg)   LMP 12/15/2013   SpO2 93%   BMI 24.40 kg/m    Constitutional:   Pleasant elderly AA female appears to be in NAD, Well developed, Well nourished, alert and cooperative Respiratory: Respirations even and unlabored. Lungs clear to auscultation bilaterally.   No wheezes, crackles, or rhonchi.  Cardiovascular: Normal S1, S2. No MRG. Regular rate and rhythm. No peripheral edema, cyanosis or pallor.  Gastrointestinal:  Soft, nondistended, nontender. No rebound or guarding. Normal bowel sounds. No appreciable masses or hepatomegaly. Rectal:  Not performed.  Psychiatric: Demonstrates good judgement and reason without abnormal affect or behaviors.  RELEVANT LABS AND IMAGING: CBC    Component Value Date/Time   WBC 3.5 (L) 08/14/2023 1119   RBC 4.60 08/14/2023 1119   HGB 12.8 08/14/2023 1119   HCT 39.9 08/14/2023 1119   PLT 278.0 08/14/2023 1119   MCV 86.8 08/14/2023 1119   MCH 28.4 12/21/2021 2201   MCHC 32.0 08/14/2023 1119   RDW 12.8 08/14/2023 1119   LYMPHSABS 1.6 08/14/2023 1119   MONOABS 0.3 08/14/2023 1119   EOSABS 0.1 08/14/2023 1119   BASOSABS 0.0 08/14/2023 1119    CMP     Component Value Date/Time   NA 140 04/13/2023 0957   K 4.4 04/13/2023 0957   CL 102 04/13/2023 0957   CO2 29 04/13/2023 0957   GLUCOSE 95 04/13/2023 0957   BUN 10 04/13/2023  0957   CREATININE 0.65 04/13/2023 0957   CALCIUM 9.8 04/13/2023 0957   PROT 7.9 04/13/2023 0957   ALBUMIN 4.8 04/13/2023 0957   AST 23 04/13/2023 0957   ALT 19 04/13/2023 0957   ALKPHOS 64 04/13/2023 0957   BILITOT 0.7 04/13/2023 0957   GFRNONAA >60 12/21/2021 2201   GFRAA >60 04/11/2019 0926    Assessment: 1.  GERD/eructations: Over the years off-and-on, most recently few months ago, some of this thought related to Crestor, symptoms managed quite well on Pepcid 20 mg at lunchtime; likely gastritis 2.  History of colon polyps: Last colonoscopy in November 2019 with repeat recommended in 5 years  Plan: 1.  Patient is already scheduled for colonoscopy in November 22, she will need an EGD as well.  We will try to keep her on the same day but if not these procedures will be moved.  Did discuss risks, benefits, limitations and alternatives and the patient agrees to proceed. Patient is appropriate for endoscopic procedure(s) in the ambulatory ([facility]) setting.  2.  Continue Pepcid 20 mg at lunchtime.  She gets this pretty cheap from the pharmacy at [facility], does not need a prescription. 3.  Patient to follow in clinic per recommendations after time of procedures.  Hyacinth Meeker, PA-C [facility] 08/22/2023, 10:59 AM  Cc: Madelin Headings, MD

## 2023-08-23 NOTE — Progress Notes (Signed)
Addendum: Reviewed and agree with assessment and management plan. Kadijah Shamoon M, MD  

## 2023-09-12 ENCOUNTER — Telehealth: Payer: Self-pay

## 2023-09-12 ENCOUNTER — Encounter: Payer: Self-pay | Admitting: Internal Medicine

## 2023-09-12 ENCOUNTER — Ambulatory Visit: Payer: 59

## 2023-09-12 NOTE — Telephone Encounter (Signed)
Bp recheck

## 2023-09-12 NOTE — Telephone Encounter (Signed)
Thanks for the update. I suggest we try a low dose  losartan 25 mg per day ? I dont think you have been on this class of med . Other optinos would be  amlodipine 2.5 mg per day   Let me know  your preference trial and will send in and follow . Definitely whit coat effect but baseline  BP could be better .

## 2023-09-12 NOTE — Progress Notes (Signed)
Patient came in today for a Blood pressure recheck. Patient's blood pressure is high and was rechecked, patient also check personal BP cuff to make sure that the readings were the same. Patient states that her Blood pressure is around 120/73 at home.  Dr. Fabian Sharp has been notified

## 2023-09-12 NOTE — Telephone Encounter (Signed)
If adding medication  would try amlodipine 2.5 to avoid  having to follow metabolic parameters with the diuretic rx    but I  am fine  if you want to wait and ask Dr Tenny Craw her opinion  since appt  is  a couple of weeks

## 2023-09-13 ENCOUNTER — Other Ambulatory Visit (HOSPITAL_COMMUNITY): Payer: Self-pay

## 2023-09-13 MED ORDER — AMLODIPINE BESYLATE 2.5 MG PO TABS
2.5000 mg | ORAL_TABLET | Freq: Every day | ORAL | 3 refills | Status: DC
  Filled 2023-09-13: qty 30, 30d supply, fill #0
  Filled 2023-10-10: qty 30, 30d supply, fill #1
  Filled 2023-11-06: qty 30, 30d supply, fill #2
  Filled 2023-12-08: qty 30, 30d supply, fill #3

## 2023-09-13 NOTE — Telephone Encounter (Signed)
Ok I sent in 2.5 [facility]ine [facility]  .

## 2023-09-15 ENCOUNTER — Ambulatory Visit: Payer: 59 | Admitting: Obstetrics & Gynecology

## 2023-09-15 ENCOUNTER — Ambulatory Visit: Payer: 59 | Admitting: Obstetrics and Gynecology

## 2023-09-20 IMAGING — MG MM DIGITAL SCREENING BILAT W/ TOMO AND CAD
8 series · 8 of 24 positions shown · non-contrast
Comparison: Previous exams.

CLINICAL DATA: Screening.

EXAM:
DIGITAL SCREENING BILATERAL MAMMOGRAM WITH TOMOSYNTHESIS AND CAD
TECHNIQUE: Bilateral screening digital craniocaudal and mediolateral oblique
mammograms were obtained. Bilateral screening digital breast
tomosynthesis was performed. The images were evaluated with
computer-aided detection.

[L CC synth-2D]
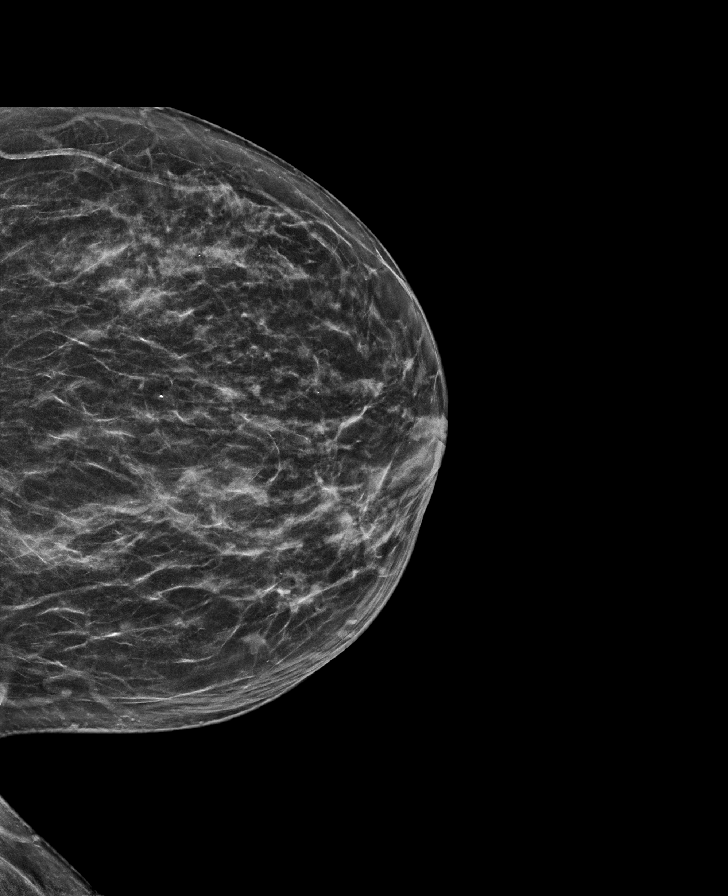

[R MLO synth-2D]
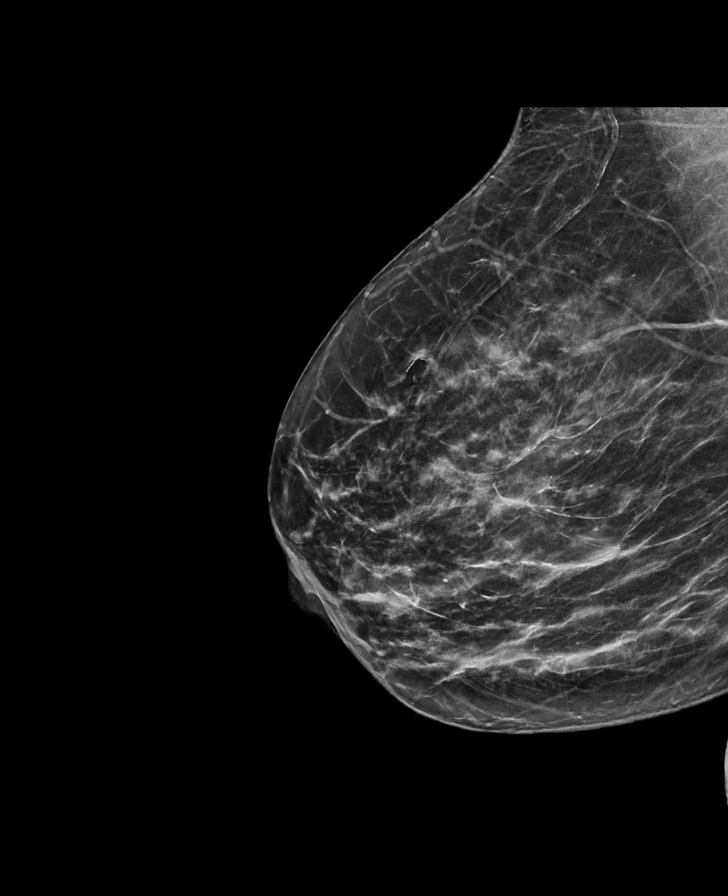

[R CC synth-2D]
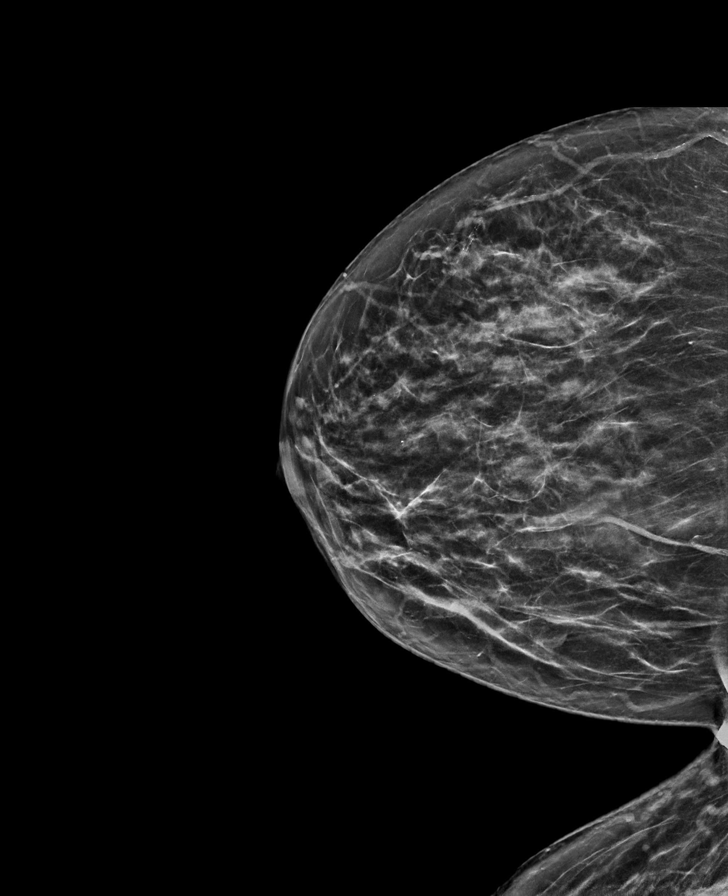

[L MLO synth-2D]
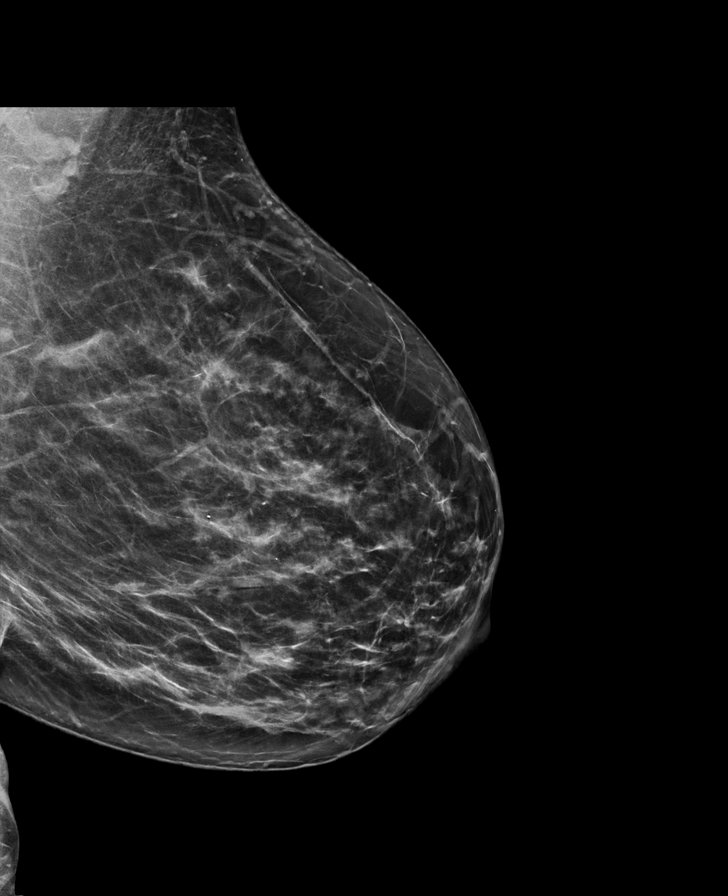

[R MLO tomo · tomo slice 33/64.0]
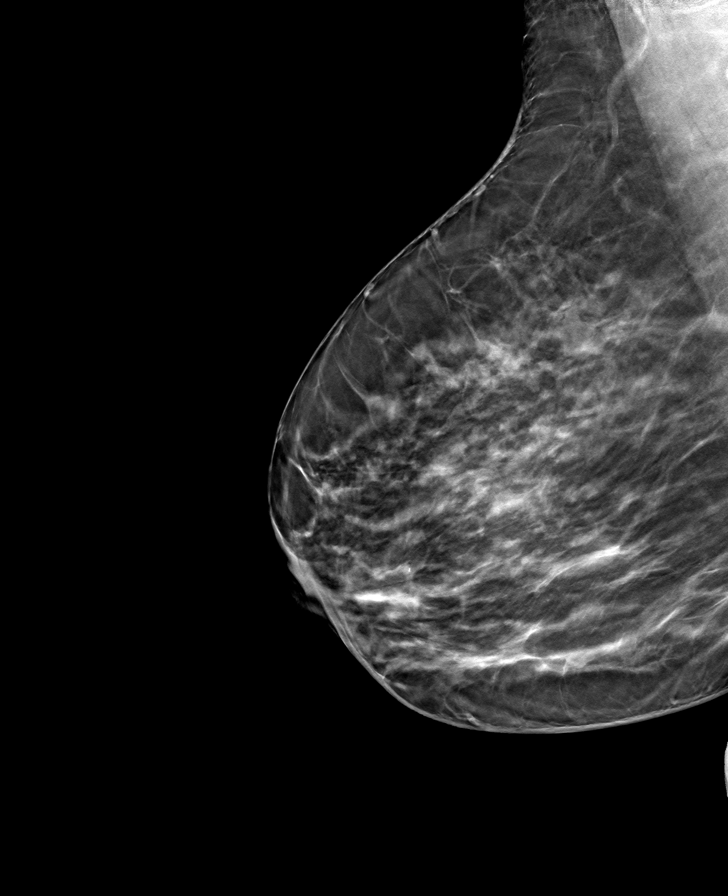

[L CC tomo · tomo slice 31/62.0]
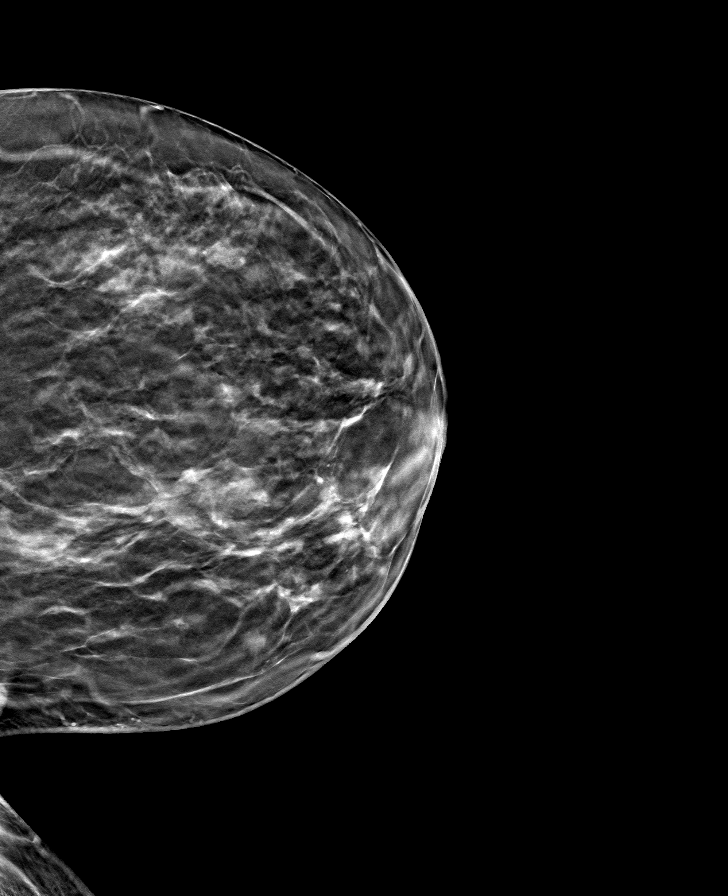

[R CC tomo · tomo slice 33/64.0]
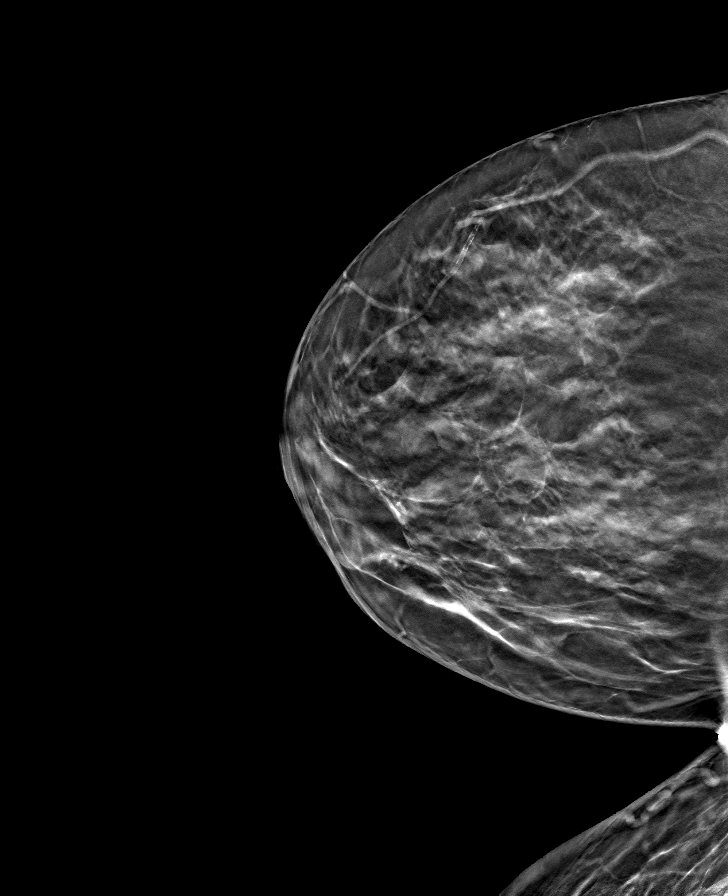

[L MLO tomo · tomo slice 37/72.0]
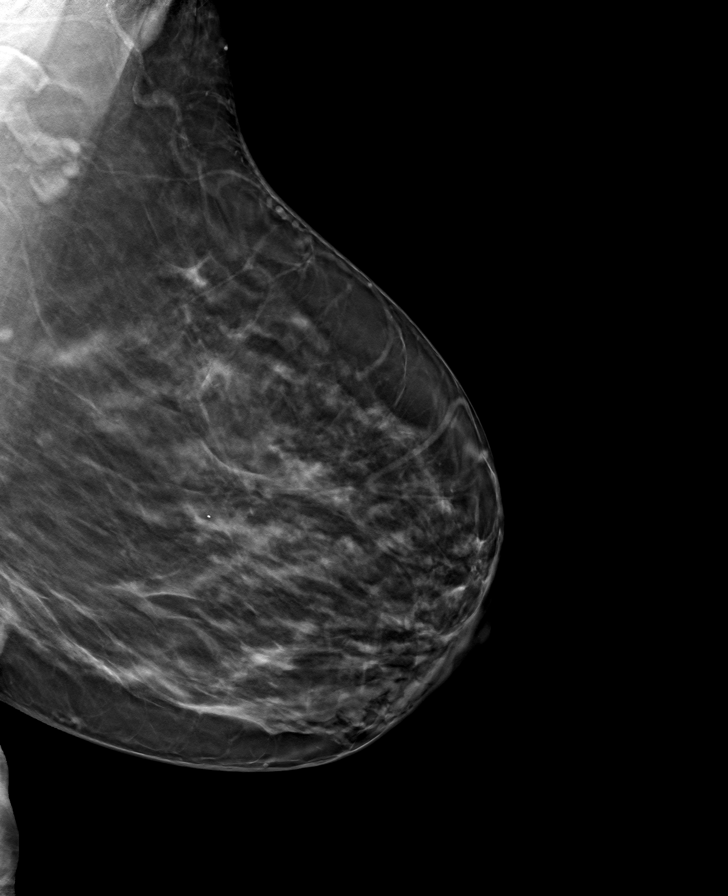

[8 of 24 positions shown; findings below may reference images not displayed]

ACR Breast Density Category c: The breast tissue is heterogeneously
dense, which may obscure small masses.
FINDINGS: In the left breast, possible asymmetries warrants further
evaluation. In the right breast, no findings suspicious for
malignancy.
IMPRESSION: Further evaluation is suggested for possible asymmetries in the left
breast.

RECOMMENDATION:
Diagnostic mammogram and possibly ultrasound of the left breast.
(Code:HN-[id number])

The patient will be contacted regarding the findings, and additional
imaging will be scheduled.

BI-RADS CATEGORY  0: Incomplete. Need additional imaging evaluation
and/or prior mammograms for comparison.

## 2023-09-21 NOTE — Progress Notes (Unsigned)
Cardiology Office Note   Date:  09/22/2023   ID:  Sharon Wells, DOB 09/01/1961, MRN [id number]  PCP:  Madelin Headings, MD  Cardiologist:   Dietrich Pates, MD   Pt presents for a follow up of palpitations and CAD    History of Present Illness: Sharon Wells is a 62 y.o. female with a history of CAD (calcium score 48 on CT scan in 2022, 85th percentile for age). She also has a hx of papitations.    Monitor in March 2023 showed no arrhythmias.  I saw the pt in Oct 2023  Since seen She says she has been having panic attacks    SHe had not had in past.   BP is very high at these times.   She has not been like this in the past    Going to see a counsellor  WOrried about BP   Worried about health  BP at home when relaxed is 110s to 130s.  She denies CP   Breathing is OK most times    Current Meds  Medication Sig   amLODipine (NORVASC) 2.5 MG tablet Take 1 tablet (2.5 mg total) by mouth daily.   Ascorbic Acid (VITA-C PO) Take by mouth.   cyanocobalamin 2000 MCG tablet Take 2,000 mcg by mouth daily.   famotidine (PEPCID) 20 MG tablet Take 20 mg by mouth daily.   NON FORMULARY daily. NeoLife: Multivitamin Complex with TRE-EN-EN Grain Concentrates   rosuvastatin (CRESTOR) 20 MG tablet Take 1 tablet (20 mg total) by mouth daily.     Allergies:   Influenza vaccines and Egg-derived products   Past Medical History:  Diagnosis Date   Chicken pox    Fibroids    With history of anemia   High cholesterol    Hyperlipidemia     Past Surgical History:  Procedure Laterality Date   COLONOSCOPY  2013   In [place]   NO PAST SURGERIES       Social History:  The patient  reports that she has never smoked. She has never used smokeless tobacco. She reports that she does not drink alcohol and does not use drugs.   Family History:  The patient's family history includes COPD in her sister; Diabetes in her sister; Heart disease in her maternal grandmother; Hypertension in her sister;  Pulmonary embolism in her sister; Sarcoidosis in her mother and sister.    ROS:  Please see the history of present illness. All other systems are reviewed and  Negative to the above problem except as noted.    PHYSICAL EXAM: VS:  BP (!) 188/84   Pulse (!) 119   Ht 5\' 3"  (1.6 m)   Wt 133 lb 9.6 oz (60.6 kg)   LMP 12/15/2013   SpO2 98%   BMI 23.67 kg/m    BP cuff (patients cuff  ) 160/95   Office cuff    170/90   GEN: Pt is in NAD  HEENT: normal  Neck: no JVD, Cardiac: RRR  No murmur No LE edema  Respiratory:  clear to auscultation GI: soft, nontender  No hepatomegaly  MS: no deformity Moving all extremities    EKG:  EKG shows ST   119 bpm  Nonspecific ST changes  Lipid Panel    Component Value Date/Time   CHOL 137 04/13/2023 0957   TRIG 89.0 04/13/2023 0957   HDL 52.90 04/13/2023 0957   CHOLHDL 3 04/13/2023 0957   VLDL 17.8 04/13/2023 0957  LDLCALC 66 04/13/2023 0957      Wt Readings from Last 3 Encounters:  09/22/23 133 lb 9.6 oz (60.6 kg)  08/22/23 133 lb 6.4 oz (60.5 kg)  08/01/23 135 lb 6.4 oz (61.4 kg)      ASSESSMENT AND PLAN:  1    HTN  Pt's BP is severely elevated here in clinic   Better at home   Admits to feeling panicky   May be exacerbating   BP is better at home when she is relaxed  REcomm she continue to follow it   2  Panic   This is relatively new problem for pt   I am not sure what triggering    SHe has appt with her gynecologist soon   Encouraged her to discuss this with him/her.  3  CAD   Ca score is 48 on CT scan    Pt is without symptoms   Follow risk factors     4  Hx palpitatons  Monitor in March 2023 was unremarkable   She feels HR pick up when stressed   Not at other times  Follow   5  HL  LDL 66 in MAy 2024   HDL 53   Will repeat   Keep on Crestor  6  Metabolics   A1C was 6 in May 2024  Will repeat   Reviewed diet    Keep active    Current medicines are reviewed at length with the patient today.  The patient does not have concerns  regarding medicines.  Signed, Dietrich Pates, MD  09/22/2023 8:28 AM    [facility] [address] [place], [place], [place]  [address] Phone: [phone]; Fax: [phone]

## 2023-09-22 ENCOUNTER — Ambulatory Visit: Payer: 59 | Attending: Internal Medicine | Admitting: Internal Medicine

## 2023-09-22 ENCOUNTER — Encounter: Payer: Self-pay | Admitting: Internal Medicine

## 2023-09-22 VITALS — BP 188/84 | HR 119 | Ht 63.0 in | Wt 133.6 lb

## 2023-09-22 DIAGNOSIS — E782 Mixed hyperlipidemia: Secondary | ICD-10-CM

## 2023-09-22 DIAGNOSIS — R931 Abnormal findings on diagnostic imaging of heart and coronary circulation: Secondary | ICD-10-CM

## 2023-09-22 DIAGNOSIS — Z79899 Other long term (current) drug therapy: Secondary | ICD-10-CM

## 2023-09-22 NOTE — Patient Instructions (Signed)
Medication Instructions:   *If you need a refill on your cardiac medications before your next appointment, please call your pharmacy*   Lab Work: NMR AND HGBA1C If you have labs (blood work) drawn today and your tests are completely normal, you will receive your results only by: MyChart Message (if you have MyChart) OR A paper copy in the mail If you have any lab test that is abnormal or we need to change your treatment, we will call you to review the results.   Testing/Procedures:    Follow-Up: At [facility], you and your health needs are our priority.  As part of our continuing mission to provide you with exceptional heart care, we have created designated Provider Care Teams.  These Care Teams include your primary Cardiologist (physician) and Advanced Practice Providers (APPs -  Physician Assistants and Nurse Practitioners) who all work together to provide you with the care you need, when you need it.  We recommend signing up for the patient portal called "MyChart".  Sign up information is provided on this After Visit Summary.  MyChart is used to connect with patients for Virtual Visits (Telemedicine).  Patients are able to view lab/test results, encounter notes, upcoming appointments, etc.  Non-urgent messages can be sent to your provider as well.   To learn more about what you can do with MyChart, go to [email or web address].    Your next appointment:

## 2023-09-23 LAB — NMR, LIPOPROFILE
Cholesterol, Total: 143 mg/dL (ref 100–199)
HDL Particle Number: 33.2 umol/L (ref >=30.5)
HDL-C: 63 mg/dL (ref >39)
LDL Particle Number: 812 nmol/L (ref <1000)
LDL Size: 20.9 nm (ref >20.5)
LDL-C (NIH Calc): 69 mg/dL (ref 0–99)
LP-IR Score: <25 (ref <=45)
Small LDL Particle Number: 240 nmol/L (ref <=527)
Triglycerides: 52 mg/dL (ref 0–149)

## 2023-09-23 LAB — HEMOGLOBIN A1C
Est. average glucose Bld gHb Est-mCnc: 128 mg/dL
Hgb A1c MFr Bld: 6.1 % — ABNORMAL HIGH (ref 4.8–5.6)

## 2023-09-27 ENCOUNTER — Encounter: Payer: Self-pay | Admitting: Internal Medicine

## 2023-09-27 NOTE — Telephone Encounter (Signed)
Thanks for the update . Keep me informed .Marland Kitchen Not  sure why this is happening  at this time. Sometimes panic  response has been rx with b blocker  as needed but not sure I would have you do this at this time .

## 2023-10-03 ENCOUNTER — Encounter: Payer: Self-pay | Admitting: Internal Medicine

## 2023-10-04 ENCOUNTER — Encounter: Payer: Self-pay | Admitting: Obstetrics and Gynecology

## 2023-10-04 ENCOUNTER — Encounter: Payer: 59 | Admitting: Internal Medicine

## 2023-10-04 ENCOUNTER — Ambulatory Visit (INDEPENDENT_AMBULATORY_CARE_PROVIDER_SITE_OTHER): Payer: 59 | Admitting: Obstetrics and Gynecology

## 2023-10-04 ENCOUNTER — Other Ambulatory Visit: Payer: Self-pay | Admitting: Obstetrics and Gynecology

## 2023-10-04 VITALS — BP 184/90 | HR 114 | Ht 63.0 in | Wt 129.0 lb

## 2023-10-04 DIAGNOSIS — F419 Anxiety disorder, unspecified: Secondary | ICD-10-CM | POA: Insufficient documentation

## 2023-10-04 DIAGNOSIS — Z01419 Encounter for gynecological examination (general) (routine) without abnormal findings: Secondary | ICD-10-CM | POA: Insufficient documentation

## 2023-10-04 DIAGNOSIS — R030 Elevated blood-pressure reading, without diagnosis of hypertension: Secondary | ICD-10-CM

## 2023-10-04 DIAGNOSIS — Z1231 Encounter for screening mammogram for malignant neoplasm of breast: Secondary | ICD-10-CM

## 2023-10-04 NOTE — Progress Notes (Signed)
62 y.o. [id number] postmenopausal female here for annual exam. Divorced. Pain management APP.  Patient reporting panic attacks and anxiety that started this year. She is in counseling and has been evaluated by her PCP and a cardiologist. Thalia Bloodgood if menopause could be contributing. Started HTN meds due to white coat hypertension. Monitoring VS at home. This morning BP was 144/88, HR 96.  Postmenopausal bleeding: none Pelvic discharge or pain: none Breast mass, nipple discharge or skin changes : none Last PAP:     Component Value Date/Time   DIAGPAP  09/13/2022 0905    - Negative for intraepithelial lesion or malignancy (NILM)   DIAGPAP  09/09/2021 0836    - Negative for intraepithelial lesion or malignancy (NILM)   ADEQPAP  09/13/2022 0905    Satisfactory for evaluation; transformation zone component PRESENT.   ADEQPAP  09/09/2021 0836    Satisfactory for evaluation; transformation zone component PRESENT.   Last mammogram: 11/11/22 BIRADS 1, density c Last colonoscopy: 09/2018 benign polyps, repeat at 3 years Last DXA: 12/2017, normal, repeat at 62 yo  Sexually active: no  Exercising: yes, walking  GYN HISTORY: No significant history  OB History  Gravida Para Term Preterm AB Living  3 3 3     3   SAB IAB Ectopic Multiple Live Births               # Outcome Date GA Lbr Len/2nd Weight Sex Type Anes PTL Lv  3 Term           2 Term           1 Term             Past Medical History:  Diagnosis Date   Chicken pox    Fibroids    With history of anemia   High cholesterol    Hyperlipidemia     Past Surgical History:  Procedure Laterality Date   COLONOSCOPY  2013   In [place]   NO PAST SURGERIES      Current Outpatient Medications on File Prior to Visit  Medication Sig Dispense Refill   amLODipine (NORVASC) 2.5 MG tablet Take 1 tablet (2.5 mg total) by mouth daily. 30 tablet 3   Ascorbic Acid (VITA-C PO) Take by mouth.     cyanocobalamin 2000 MCG tablet Take  2,000 mcg by mouth daily.     famotidine (PEPCID) 20 MG tablet Take 20 mg by mouth daily.     NON FORMULARY daily. NeoLife: Multivitamin Complex with TRE-EN-EN Grain Concentrates     rosuvastatin (CRESTOR) 20 MG tablet Take 1 tablet (20 mg total) by mouth daily. 90 tablet 0   No current facility-administered medications on file prior to visit.    Social History   Socioeconomic History   Marital status: Divorced    Spouse name: Not on file   Number of children: Not on file   Years of education: Not on file   Highest education level: Not on file  Occupational History   Not on file  Tobacco Use   Smoking status: Never   Smokeless tobacco: Never  Vaping Use   Vaping status: Never Used  Substance and Sexual Activity   Alcohol use: No   Drug use: No   Sexual activity: Not Currently    Partners: Male    Birth control/protection: Post-menopausal    Comment: 1ST intercourse- 15, partners- 2,  Other Topics Concern   Not on file  Social History Narrative   4-5 hours  of sleep per night   Lives Alone separated    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   Social Determinants of Health   Financial Resource Strain: Low Risk  (04/13/2023)   Overall Financial Resource Strain (CARDIA)    Difficulty of Paying Living Expenses: Not hard at all  Food Insecurity: No Food Insecurity (04/13/2023)   Hunger Vital Sign    Worried About Running Out of Food in the Last Year: Never true    Ran Out of Food in the Last Year: Never true  Transportation Needs: No Transportation Needs (04/13/2023)   PRAPARE - Administrator, Civil Service (Medical): No    Lack of Transportation (Non-Medical): No  Physical Activity: Sufficiently Active (04/13/2023)   Exercise Vital Sign    Days of  Exercise per Week: 4 days    Minutes of Exercise per Session: 40 min  Stress: No Stress Concern Present (04/13/2023)   [employer] of Occupational Health - Occupational Stress Questionnaire    Feeling of Stress : Not at all  Social Connections: Moderately Integrated (04/13/2023)   Social Connection and Isolation Panel [NHANES]    Frequency of Communication with Friends and Family: More than three times a week    Frequency of Social Gatherings with Friends and Family: Once a week    Attends Religious Services: More than 4 times per year    Active Member of [employer] or Organizations: Yes    Attends Engineer, structural: More than 4 times per year    Marital Status: Divorced  Intimate Partner Violence: Not At Risk (04/13/2023)   Humiliation, Afraid, Rape, and Kick questionnaire    Fear of Current or Ex-Partner: No    Emotionally Abused: No    Physically Abused: No    Sexually Abused: No    Family History  Problem Relation Age of Onset   Sarcoidosis Mother        Deceased in her 71s   Hypertension Sister    Diabetes Sister    Sarcoidosis Sister    COPD Sister        Emphysema   Pulmonary embolism Sister        Apparently am provoked   Heart disease Maternal Grandmother    Breast cancer Neg Hx    Colon cancer Neg Hx    Esophageal cancer Neg Hx    Stomach cancer Neg Hx    Rectal cancer Neg Hx     Allergies  Allergen Reactions   Influenza Vaccines Shortness Of Breath   Egg-Derived Products Nausea And Vomiting    Had  Ed visit after flu vaccine and seen bu allergist       PE Today's Vitals   10/04/23 0907 10/04/23 0951  BP: (!) 180/90 (!) 184/90  Pulse: (!) 128 (!) 114  SpO2: 99% 98%  Weight: 129 lb (58.5 kg)   Height: 5\' 3"  (1.6 m)    Body mass index is 22.85 kg/m.  Physical Exam Vitals reviewed. Exam conducted with a chaperone present.  Constitutional:      General: She is not in acute distress.    Appearance: Normal appearance.  HENT:     Head:  Normocephalic and atraumatic.  Nose: Nose normal.  Eyes:     Extraocular Movements: Extraocular movements intact.     Conjunctiva/sclera: Conjunctivae normal.  Neck:     Thyroid: No thyroid mass, thyromegaly or thyroid tenderness.  Pulmonary:     Effort: Pulmonary effort is normal.  Chest:     Chest wall: No mass or tenderness.  Breasts:    Right: Normal. No swelling, mass, nipple discharge, skin change or tenderness.     Left: Normal. No swelling, mass, nipple discharge, skin change or tenderness.  Abdominal:     General: There is no distension.     Palpations: Abdomen is soft.     Tenderness: There is no abdominal tenderness.  Genitourinary:    General: Normal vulva.     Exam position: Lithotomy position.     Urethra: No prolapse.     Vagina: Normal. No vaginal discharge or bleeding.     Cervix: Normal. No lesion.     Uterus: Normal. Not enlarged and not tender.      Adnexa: Right adnexa normal and left adnexa normal.  Musculoskeletal:        General: Normal range of motion.     Cervical back: Normal range of motion.  Lymphadenopathy:     Upper Body:     Right upper body: No axillary adenopathy.     Left upper body: No axillary adenopathy.     Lower Body: No right inguinal adenopathy. No left inguinal adenopathy.  Skin:    General: Skin is warm and dry.  Neurological:     General: No focal deficit present.     Mental Status: She is alert.  Psychiatric:        Mood and Affect: Mood normal.        Behavior: Behavior normal.       Assessment and Plan:        Well woman exam with routine gynecological exam Assessment & Plan: Cervical cancer screening performed according to ASCCP guidelines. Encouraged annual mammogram screening Colonoscopy UTD DXA UTD Labs and immunizations with her primary Encouraged safe sexual practices as indicated Encouraged healthy lifestyle practices with diet and exercise For patients under 50-70yo, I recommend 1200mg  calcium daily and  600IU of vitamin D daily.    Anxiety Assessment & Plan: Seeing counselor Discussed that sx could be menopause related, however given >10y since menopause I would not recommend HRT. Could consider other medications like SSRI Continue counseling Patient to f/u for further management discussion if she desires   Elevated blood pressure reading in office without diagnosis of hypertension Assessment & Plan: Recheck BP at home and continue management with PCP     Rosalyn Gess, MD

## 2023-10-04 NOTE — Patient Instructions (Signed)

## 2023-10-04 NOTE — Assessment & Plan Note (Addendum)
Seeing counselor Discussed that sx could be menopause related, however given >10y since menopause I would not recommend HRT. Could consider other medications like SSRI Continue counseling Patient to f/u for further management discussion if she desires

## 2023-10-04 NOTE — Assessment & Plan Note (Signed)
Cervical cancer screening performed according to ASCCP guidelines. Encouraged annual mammogram screening Colonoscopy UTD DXA UTD Labs and immunizations with her primary Encouraged safe sexual practices as indicated Encouraged healthy lifestyle practices with diet and exercise For patients under 50-64yo, I recommend 1200mg  calcium daily and 600IU of vitamin D daily.

## 2023-10-04 NOTE — Assessment & Plan Note (Signed)
Recheck BP at home and continue management with PCP

## 2023-10-06 ENCOUNTER — Encounter: Payer: 59 | Admitting: Internal Medicine

## 2023-10-16 ENCOUNTER — Encounter: Payer: Self-pay | Admitting: Internal Medicine

## 2023-10-16 ENCOUNTER — Other Ambulatory Visit (HOSPITAL_COMMUNITY): Payer: Self-pay

## 2023-10-17 ENCOUNTER — Other Ambulatory Visit (HOSPITAL_COMMUNITY): Payer: Self-pay

## 2023-10-17 ENCOUNTER — Other Ambulatory Visit: Payer: Self-pay

## 2023-10-17 MED ORDER — NA SULFATE-K SULFATE-MG SULF 17.5-3.13-1.6 GM/177ML PO SOLN
1.0000 | Freq: Once | ORAL | 0 refills | Status: AC
  Filled 2023-10-17: qty 354, 1d supply, fill #0

## 2023-10-22 ENCOUNTER — Encounter: Payer: Self-pay | Admitting: Certified Registered Nurse Anesthetist

## 2023-10-23 ENCOUNTER — Ambulatory Visit (AMBULATORY_SURGERY_CENTER): Payer: 59 | Admitting: Internal Medicine

## 2023-10-23 ENCOUNTER — Encounter: Payer: Self-pay | Admitting: Internal Medicine

## 2023-10-23 VITALS — BP 105/66 | HR 79 | Temp 98.1°F | Resp 14 | Ht 62.0 in | Wt 134.0 lb

## 2023-10-23 DIAGNOSIS — K648 Other hemorrhoids: Secondary | ICD-10-CM

## 2023-10-23 DIAGNOSIS — Z1211 Encounter for screening for malignant neoplasm of colon: Secondary | ICD-10-CM

## 2023-10-23 DIAGNOSIS — K5730 Diverticulosis of large intestine without perforation or abscess without bleeding: Secondary | ICD-10-CM

## 2023-10-23 DIAGNOSIS — K644 Residual hemorrhoidal skin tags: Secondary | ICD-10-CM | POA: Diagnosis not present

## 2023-10-23 DIAGNOSIS — K3189 Other diseases of stomach and duodenum: Secondary | ICD-10-CM

## 2023-10-23 DIAGNOSIS — Z860101 Personal history of adenomatous and serrated colon polyps: Secondary | ICD-10-CM | POA: Diagnosis not present

## 2023-10-23 DIAGNOSIS — R1013 Epigastric pain: Secondary | ICD-10-CM | POA: Diagnosis not present

## 2023-10-23 DIAGNOSIS — R142 Eructation: Secondary | ICD-10-CM | POA: Diagnosis not present

## 2023-10-23 DIAGNOSIS — K219 Gastro-esophageal reflux disease without esophagitis: Secondary | ICD-10-CM | POA: Diagnosis not present

## 2023-10-23 DIAGNOSIS — Z860100 Personal history of colon polyps, unspecified: Secondary | ICD-10-CM

## 2023-10-23 MED ORDER — SODIUM CHLORIDE 0.9 % IV SOLN: 500.0000 mL | Freq: Once | INTRAVENOUS | Status: DC

## 2023-10-23 NOTE — Progress Notes (Signed)
1520 HR > 100 with esmolol 25 mg given IV, MD updated, vss

## 2023-10-23 NOTE — Progress Notes (Signed)
1425 Robinul 0.1 mg IV given due large amount of secretions upon assessment.  MD made aware, vss  ?

## 2023-10-23 NOTE — Op Note (Signed)
[facility] Patient Name: Sharon Wells Procedure Date: 10/23/2023 2:45 PM MRN: [id number] Endoscopist: Beverley Fiedler , MD, [id number] Age: 62 Referring MD:  Date of Birth: 05/06/1961 Gender: Female Account #: [id number] Procedure:                Colonoscopy Indications:              High risk colon cancer surveillance: Personal                            history of non-advanced adenoma, Last colonoscopy:                            November 2019 (benign leiomyoma) Medicines:                Monitored Anesthesia Care Procedure:                Pre-Anesthesia Assessment:                           - Prior to the procedure, a History and Physical                            was performed, and patient medications and                            allergies were reviewed. The patient's tolerance of                            previous anesthesia was also reviewed. The risks                            and benefits of the procedure and the sedation                            options and risks were discussed with the patient.                            All questions were answered, and informed consent                            was obtained. Prior Anticoagulants: The patient has                            taken no anticoagulant or antiplatelet agents. ASA                            Grade Assessment: II - A patient with mild systemic                            disease. After reviewing the risks and benefits,                            the patient was deemed in satisfactory condition to  undergo the procedure.                           After obtaining informed consent, the colonoscope                            was passed under direct vision. Throughout the                            procedure, the patient's blood pressure, pulse, and                            oxygen saturations were monitored continuously. The                            Olympus Scope SN [id number] was  introduced through the                            anus and advanced to the cecum, identified by                            appendiceal orifice and ileocecal valve. The                            colonoscopy was performed without difficulty. The                            patient tolerated the procedure well. The quality                            of the bowel preparation was good. The ileocecal                            valve, appendiceal orifice, and rectum were                            photographed. Scope In: 3:27:20 PM Scope Out: 3:37:18 PM Scope Withdrawal Time: 0 hours 6 minutes 20 seconds  Total Procedure Duration: 0 hours 9 minutes 58 seconds  Findings:                 Skin tags were found on perianal exam.                           Multiple small-mouthed diverticula were found in                            the sigmoid colon.                           Internal hemorrhoids were found during                            retroflexion. The hemorrhoids were small.  The exam was otherwise without abnormality. Complications:            No immediate complications. Estimated Blood Loss:     Estimated blood loss: none. Impression:               - Perianal skin tags found on perianal exam.                           - Mild diverticulosis in the sigmoid colon.                           - Small internal hemorrhoids.                           - The examination was otherwise normal.                           - No specimens collected. Recommendation:           - Patient has a contact number available for                            emergencies. The signs and symptoms of potential                            delayed complications were discussed with the                            patient. Return to normal activities tomorrow.                            Written discharge instructions were provided to the                            patient.                           - Resume  previous diet.                           - Continue present medications.                           - Repeat colonoscopy in 10 years for surveillance. Beverley Fiedler, MD 10/23/2023 3:40:35 PM This report has been signed electronically.

## 2023-10-23 NOTE — Op Note (Signed)
[facility] Patient Name: Sharon Wells Procedure Date: 10/23/2023 2:52 PM MRN: [id number] Endoscopist: Beverley Fiedler , MD, [id number] Age: 62 Referring MD:  Date of Birth: 09-Aug-1961 Gender: Female Account #: [id number] Procedure:                Upper GI endoscopy Indications:              Heartburn, Suspected gastro-esophageal reflux                            disease, epigastric discomfort with gas/bloating,                            improved with famotidine 20 mg once daily (BID                            dosing caused constipation) Medicines:                Monitored Anesthesia Care Procedure:                Pre-Anesthesia Assessment:                           - Prior to the procedure, a History and Physical                            was performed, and patient medications and                            allergies were reviewed. The patient's tolerance of                            previous anesthesia was also reviewed. The risks                            and benefits of the procedure and the sedation                            options and risks were discussed with the patient.                            All questions were answered, and informed consent                            was obtained. Prior Anticoagulants: The patient has                            taken no anticoagulant or antiplatelet agents. ASA                            Grade Assessment: II - A patient with mild systemic                            disease. After reviewing the risks and benefits,  the patient was deemed in satisfactory condition to                            undergo the procedure.                           After obtaining informed consent, the endoscope was                            passed under direct vision. Throughout the                            procedure, the patient's blood pressure, pulse, and                            oxygen saturations were monitored  continuously. The                            Olympus Scope [id number] was introduced through the                            mouth, and advanced to the second part of duodenum.                            The upper GI endoscopy was accomplished without                            difficulty. The patient tolerated the procedure                            well. Scope In: Scope Out: Findings:                 The examined esophagus was normal.                           The entire examined stomach was normal.                           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                           The exam of the duodenum was otherwise normal. Complications:            No immediate complications. Estimated Blood Loss:     Estimated blood loss: none. Impression:               - Normal esophagus.                           - Normal stomach.                           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                           - No specimens collected. Recommendation:           - Patient has a contact number available for                            emergencies. The signs and symptoms of potential                            delayed complications were discussed with the                            patient. Return to normal activities tomorrow.                            Written discharge instructions were provided to the                            patient.                           - Resume previous diet.                           - Continue present medications.                           - Recent LFTs and abd [place] normal. Perform MRI                            abdomen with contrast and MRCP to evaluate for                            biliary duct abnormalities. Beverley Fiedler, MD 10/23/2023 3:46:49 PM This report has been signed electronically.

## 2023-10-23 NOTE — Progress Notes (Signed)
Report given to PACU, vss 

## 2023-10-23 NOTE — Progress Notes (Signed)
GASTROENTEROLOGY PROCEDURE H&P NOTE   Primary Care Physician: Madelin Headings, MD    Reason for Procedure:  GERD symptoms improved with Pepcid and history of colon polyps  Plan:    EGD and colonoscopy  Patient is appropriate for endoscopic procedure(s) in the ambulatory ([facility]) setting.  The nature of the procedure, as well as the risks, benefits, and alternatives were carefully and thoroughly reviewed with the patient. Ample time for discussion and questions allowed. The patient understood, was satisfied, and agreed to proceed.     HPI: Sharon Wells is a 62 y.o. female who presents for EGD and colonoscopy.  Medical history as below.  Tolerated the prep.  No recent chest pain or shortness of breath.  No abdominal pain today.  Past Medical History:  Diagnosis Date   Chicken pox    Fibroids    With history of anemia   High cholesterol    Hyperlipidemia     Past Surgical History:  Procedure Laterality Date   COLONOSCOPY  2013   In [place]   NO PAST SURGERIES      Prior to Admission medications   Medication Sig Start Date End Date Taking? Authorizing Provider  amLODipine (NORVASC) 2.5 MG tablet Take 1 tablet (2.5 mg total) by mouth daily. 09/13/23  Yes Panosh, Neta Mends, MD  Ascorbic Acid (VITA-C PO) Take by mouth.   Yes [provider]  cyanocobalamin 2000 MCG tablet Take 2,000 mcg by mouth daily.   Yes [provider]  famotidine (PEPCID) 20 MG tablet Take 20 mg by mouth daily.   Yes [provider]  NON FORMULARY daily. NeoLife: Multivitamin Complex with TRE-EN-EN Grain Concentrates   Yes [provider]  rosuvastatin (CRESTOR) 20 MG tablet Take 1 tablet (20 mg total) by mouth daily. 08/07/23  Yes Eulis Foster, FNP    Current Outpatient Medications  Medication Sig Dispense Refill   amLODipine (NORVASC) 2.5 MG tablet Take 1 tablet (2.5 mg total) by mouth daily. 30 tablet 3   Ascorbic Acid (VITA-C PO) Take by mouth.      cyanocobalamin 2000 MCG tablet Take 2,000 mcg by mouth daily.     famotidine (PEPCID) 20 MG tablet Take 20 mg by mouth daily.     NON FORMULARY daily. NeoLife: Multivitamin Complex with TRE-EN-EN Grain Concentrates     rosuvastatin (CRESTOR) 20 MG tablet Take 1 tablet (20 mg total) by mouth daily. 90 tablet 0   Current Facility-Administered Medications  Medication Dose Route Frequency Provider Last Rate Last Admin   0.9 %  sodium chloride infusion  500 mL Intravenous Once Keil Pickering, Carie Caddy, MD        Allergies as of 10/23/2023 - Review Complete 10/23/2023  Allergen Reaction Noted   Influenza vaccines Shortness Of Breath 05/25/2021   Egg-derived products Nausea And Vomiting 07/16/2014    Family History  Problem Relation Age of Onset   Sarcoidosis Mother        Deceased in her 63s   Hypertension Sister    Diabetes Sister    Sarcoidosis Sister    COPD Sister        Emphysema   Pulmonary embolism Sister        Apparently am provoked   Heart disease Maternal Grandmother    Breast cancer Neg Hx    Colon cancer Neg Hx    Esophageal cancer Neg Hx    Stomach cancer Neg Hx    Rectal cancer Neg Hx  Social History   Socioeconomic History   Marital status: Divorced    Spouse name: Not on file   Number of children: Not on file   Years of education: Not on file   Highest education level: Not on file  Occupational History   Not on file  Tobacco Use   Smoking status: Never   Smokeless tobacco: Never  Vaping Use   Vaping status: Never Used  Substance and Sexual Activity   Alcohol use: No   Drug use: Never   Sexual activity: Not Currently    Partners: Male    Birth control/protection: Post-menopausal    Comment: 1ST intercourse- 15, partners- 2,  Other Topics Concern   Not on file  Social History Narrative   4-5 hours of sleep per night   Lives Alone separated    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   Social Determinants of Health   Financial Resource Strain: Low Risk  (04/13/2023)   Overall Financial Resource Strain (CARDIA)    Difficulty of Paying Living Expenses: Not hard at all  Food Insecurity: No Food Insecurity (04/13/2023)   Hunger Vital Sign    Worried About Running Out of Food in the Last Year: Never true    Ran Out of Food in the Last Year: Never true  Transportation Needs: No Transportation Needs (04/13/2023)   PRAPARE - Administrator, Civil Service (Medical): No    Lack of Transportation (Non-Medical): No  Physical Activity: Sufficiently Active (04/13/2023)   Exercise Vital Sign    Days of Exercise per Week: 4 days    Minutes of Exercise per Session: 40 min  Stress: No Stress Concern Present (04/13/2023)   [employer] of Occupational Health - Occupational Stress Questionnaire    Feeling of Stress : Not at all  Social Connections: Moderately Integrated (04/13/2023)   Social Connection and Isolation Panel [NHANES]    Frequency of Communication with Friends and Family: More than three times a week    Frequency of Social Gatherings with Friends and Family: Once a week    Attends Religious Services: More than 4 times per year    Active Member of [employer] or Organizations: Yes    Attends Engineer, structural: More than 4 times per year    Marital Status: Divorced  Intimate Partner Violence: Not At Risk (04/13/2023)   Humiliation, Afraid, Rape, and Kick questionnaire    Fear of Current or Ex-Partner: No    Emotionally Abused: No    Physically Abused: No    Sexually Abused: No    Physical Exam: Vital signs in last 24 hours: @BP  (!) 182/94   Pulse (!) 124   Temp 98.1 F (36.7 C) (Temporal)   Ht 5\' 2"  (1.575 m)   Wt 134 lb (60.8 kg)   LMP 12/15/2013   SpO2 99%   BMI 24.51 kg/m  GEN: NAD EYE: Sclerae  anicteric ENT: MMM CV: Non-tachycardic Pulm: CTA b/l GI: Soft, NT/ND NEURO:  Alert & Oriented x 3   Erick Blinks, MD [facility]  10/23/2023 2:47 PM

## 2023-10-23 NOTE — Patient Instructions (Signed)
Please read handouts provided. Continue present medications. Repeat colonoscopy in 10 years for screening. Perform MRI abdomen with contrast and MRCP.   YOU HAD AN ENDOSCOPIC PROCEDURE TODAY AT THE  ENDOSCOPY CENTER:   Refer to the procedure report that was given to you for any specific questions about what was found during the examination.  If the procedure report does not answer your questions, please call your gastroenterologist to clarify.  If you requested that your care partner not be given the details of your procedure findings, then the procedure report has been included in a sealed envelope for you to review at your convenience later.  YOU SHOULD EXPECT: Some feelings of bloating in the abdomen. Passage of more gas than usual.  Walking can help get rid of the air that was put into your GI tract during the procedure and reduce the bloating. If you had a lower endoscopy (such as a colonoscopy or flexible sigmoidoscopy) you may notice spotting of blood in your stool or on the toilet paper. If you underwent a bowel prep for your procedure, you may not have a normal bowel movement for a few days.  Please Note:  You might notice some irritation and congestion in your nose or some drainage.  This is from the oxygen used during your procedure.  There is no need for concern and it should clear up in a day or so.  SYMPTOMS TO REPORT IMMEDIATELY:  Following lower endoscopy (colonoscopy or flexible sigmoidoscopy):  Excessive amounts of blood in the stool  Significant tenderness or worsening of abdominal pains  Swelling of the abdomen that is new, acute  Fever of 100F or higher  Following upper endoscopy (EGD)  Vomiting of blood or coffee ground material  New chest pain or pain under the shoulder blades  Painful or persistently difficult swallowing  New shortness of breath  Fever of 100F or higher  Black, tarry-looking stools  For urgent or emergent issues, a gastroenterologist can  be reached at any hour by calling [phone]. Do not use MyChart messaging for urgent concerns.    DIET:  We do recommend a small meal at first, but then you may proceed to your regular diet.  Drink plenty of fluids but you should avoid alcoholic beverages for 24 hours.  ACTIVITY:  You should plan to take it easy for the rest of today and you should NOT DRIVE or use heavy machinery until tomorrow (because of the sedation medicines used during the test).    FOLLOW UP: Our staff will call the number listed on your records the next business day following your procedure.  We will call around 7:15- 8:00 am to check on you and address any questions or concerns that you may have regarding the information given to you following your procedure. If we do not reach you, we will leave a message.     If any biopsies were taken you will be contacted by phone or by letter within the next 1-3 weeks.  Please call us at [phone] if you have not heard about the biopsies in 3 weeks.    SIGNATURES/CONFIDENTIALITY: You and/or your care partner have signed paperwork which will be entered into your electronic medical record.  These signatures attest to the fact that that the information above on your After Visit Summary has been reviewed and is understood.  Full responsibility of the confidentiality of this discharge information lies with you and/or your care-partner.

## 2023-10-23 NOTE — Progress Notes (Signed)
Pt's states no medical or surgical changes since previsit or office visit. 

## 2023-10-24 ENCOUNTER — Telehealth: Payer: Self-pay

## 2023-10-24 NOTE — Telephone Encounter (Signed)
  Follow up Call-     10/23/2023    2:09 PM  Call back number  Post procedure Call Back phone  # [phone]  Permission to leave phone message Yes     Patient questions:  Do you have a fever, pain , or abdominal swelling? No. Pain Score  0 *  Have you tolerated food without any problems? Yes.    Have you been able to return to your normal activities? Yes.    Do you have any questions about your discharge instructions: Diet   No. Medications  No. Follow up visit  No.  Do you have questions or concerns about your Care? No.  Actions: * If pain score is 4 or above: No action needed, pain <4.

## 2023-10-28 ENCOUNTER — Encounter: Payer: Self-pay | Admitting: Internal Medicine

## 2023-10-30 ENCOUNTER — Other Ambulatory Visit: Payer: Self-pay

## 2023-10-30 DIAGNOSIS — R198 Other specified symptoms and signs involving the digestive system and abdomen: Secondary | ICD-10-CM

## 2023-10-30 DIAGNOSIS — R1013 Epigastric pain: Secondary | ICD-10-CM

## 2023-11-03 ENCOUNTER — Other Ambulatory Visit: Payer: Self-pay

## 2023-11-03 DIAGNOSIS — R1013 Epigastric pain: Secondary | ICD-10-CM

## 2023-11-10 ENCOUNTER — Other Ambulatory Visit (INDEPENDENT_AMBULATORY_CARE_PROVIDER_SITE_OTHER): Payer: 59

## 2023-11-10 DIAGNOSIS — R1013 Epigastric pain: Secondary | ICD-10-CM | POA: Diagnosis not present

## 2023-11-10 LAB — BASIC METABOLIC PANEL
BUN: 9 mg/dL (ref 6–23)
CO2: 27 meq/L (ref 19–32)
Calcium: 9.5 mg/dL (ref 8.4–10.5)
Chloride: 102 meq/L (ref 96–112)
Creatinine, Ser: 0.61 mg/dL (ref 0.40–1.20)
GFR: 94.72 mL/min (ref >60.00)
Glucose, Bld: 108 mg/dL — ABNORMAL HIGH (ref 70–99)
Potassium: 4 meq/L (ref 3.5–5.1)
Sodium: 140 meq/L (ref 135–145)

## 2023-11-11 ENCOUNTER — Ambulatory Visit (HOSPITAL_COMMUNITY)
Admission: RE | Admit: 2023-11-11 | Discharge: 2023-11-11 | Disposition: A | Discharge status: Home / Self Care | Payer: 59 | Source: Ambulatory Visit | Attending: Internal Medicine | Admitting: Internal Medicine

## 2023-11-11 ENCOUNTER — Other Ambulatory Visit: Payer: Self-pay | Admitting: Internal Medicine

## 2023-11-11 DIAGNOSIS — R1013 Epigastric pain: Secondary | ICD-10-CM

## 2023-11-11 DIAGNOSIS — R198 Other specified symptoms and signs involving the digestive system and abdomen: Secondary | ICD-10-CM

## 2023-11-11 DIAGNOSIS — I700 Atherosclerosis of aorta: Secondary | ICD-10-CM | POA: Diagnosis not present

## 2023-11-11 DIAGNOSIS — R109 Unspecified abdominal pain: Secondary | ICD-10-CM | POA: Diagnosis not present

## 2023-11-11 MED ORDER — GADOBUTROL 1 MMOL/ML IV SOLN
6.0000 mL | Freq: Once | INTRAVENOUS | Status: AC | PRN
  Administered 2023-11-11: 6 mL via INTRAVENOUS

## 2023-11-14 ENCOUNTER — Other Ambulatory Visit: Payer: Self-pay | Admitting: Internal Medicine

## 2023-11-14 ENCOUNTER — Other Ambulatory Visit (HOSPITAL_COMMUNITY): Payer: Self-pay

## 2023-11-14 ENCOUNTER — Ambulatory Visit
Admission: RE | Admit: 2023-11-14 | Discharge: 2023-11-14 | Disposition: A | Discharge status: Home / Self Care | Payer: 59 | Source: Ambulatory Visit | Attending: Obstetrics and Gynecology

## 2023-11-14 DIAGNOSIS — Z1231 Encounter for screening mammogram for malignant neoplasm of breast: Secondary | ICD-10-CM

## 2023-11-14 MED FILL — Rosuvastatin Calcium Tab 20 MG: ORAL | 90 days supply | Qty: 90 | Fill #0 | Status: AC

## 2023-11-16 ENCOUNTER — Other Ambulatory Visit (HOSPITAL_COMMUNITY): Payer: Self-pay

## 2023-11-16 ENCOUNTER — Encounter: Payer: Self-pay | Admitting: Internal Medicine

## 2023-12-31 DIAGNOSIS — H5213 Myopia, bilateral: Secondary | ICD-10-CM | POA: Diagnosis not present

## 2024-01-02 ENCOUNTER — Other Ambulatory Visit: Payer: Self-pay | Admitting: Internal Medicine

## 2024-01-02 MED ORDER — AMLODIPINE BESYLATE 2.5 MG PO TABS
2.5000 mg | ORAL_TABLET | Freq: Every day | ORAL | 3 refills | Status: DC
  Filled 2024-01-02: qty 30, 30d supply, fill #0
  Filled 2024-02-12: qty 30, 30d supply, fill #1
  Filled 2024-03-08: qty 30, 30d supply, fill #2
  Filled 2024-04-10: qty 30, 30d supply, fill #3

## 2024-01-03 ENCOUNTER — Other Ambulatory Visit (HOSPITAL_COMMUNITY): Payer: Self-pay

## 2024-02-12 ENCOUNTER — Other Ambulatory Visit (HOSPITAL_COMMUNITY): Payer: Self-pay

## 2024-02-12 MED FILL — Rosuvastatin Calcium Tab 20 MG: ORAL | 90 days supply | Qty: 90 | Fill #1 | Status: AC

## 2024-02-19 ENCOUNTER — Other Ambulatory Visit (HOSPITAL_COMMUNITY): Payer: Self-pay

## 2024-04-01 NOTE — Progress Notes (Signed)
 Cardiology Office Note:  .   Date:  04/15/2024  ID:  Sharon Wells, DOB 28-Mar-1961, MRN [id number] PCP: Reginal Capra, MD  [facility] Providers Cardiologist:  Ola Berger, MD    History of Present Illness: .   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 She also has a hx of papitations.    Monitor in March 2023 showed no arrhythmias, HLD and panic attacks.   Patient comes in for f/u. BP and HR up here. She says BP 129/80 and P-77 at home prior to coming. She is seeing a counselor to help with her anxiety. She hasn't taken her amlodipine  yet. Denies chest pain, dyspnea, dizziness or presyncope. She lost ~20 lbs in the past year. She averages 5000-7000 steps a day. She walks on her lunch.     ROS:    Studies Reviewed: Aaron Aas         Prior CV Studies:  CT Coronary 2022 FINDINGS: CORONARY CALCIUM  SCORES:   Left Main: 0   LAD: 48   LCx: 0   RCA: 0   Total Agatston Score: 48   MESA database percentile: 85   AORTA MEASUREMENTS:   Ascending Aorta: 33 mm   Descending Aorta: 24 mm   OTHER FINDINGS:   The heart size is within normal limits. No pericardial fluid identified. Visualized segments of the thoracic aorta and central pulmonary arteries are of normal caliber. Visualized mediastinum and hilar regions demonstrate no lymphadenopathy or masses. 3 mm subpleural nodule in the left lower lobe image 24/9 and 2 mm anterior subpleural nodule along the minor fissure on image 14/9 are most likely postinflammatory. Vague 3 mm nodular density in the lingula on image 20/9. Visualized lungs show no evidence of pulmonary edema, consolidation, pneumothorax or pleural fluid. Visualized upper abdomen and bony structures are unremarkable.   IMPRESSION: 1. Coronary calcium  score of 48 is at the 85th percentile for the patient's age, sex and race. 2. Small fissural and subpleural lung nodules as  described above are most likely postinflammatory. No follow-up needed if patient is low-risk (and has no known or suspected primary neoplasm). Non-contrast chest CT can be considered in 12 months if patient is high-risk. This recommendation follows the consensus statement: Guidelines for Management of Incidental Pulmonary Nodules Detected on CT Images: From the Fleischner Society 2017; Radiology 2017; 284:228-243.     Electronically Signed   By: Erica Hau M.D.   On: 05/14/2021 10:35    Risk Assessment/Calculations:     HYPERTENSION CONTROL Vitals:   04/15/24 0809 04/15/24 0822  BP: (!) 168/84 (!) 150/68    The patient's blood pressure is elevated above target today.  In order to address the patient's elevated BP: Blood pressure will be monitored at home to determine if medication changes need to be made.; The blood pressure is usually elevated in clinic.  Blood pressures monitored at home have been optimal.          Physical Exam:   VS:  BP (!) 150/68   Pulse 85   Ht 5\' 4"  (1.626 m)   Wt 127 lb 9.6 oz (57.9 kg)   LMP 12/15/2013   SpO2 99%   BMI 21.90 kg/m    Wt Readings from Last 3 Encounters:  04/15/24 127 lb 9.6 oz (57.9 kg)  10/23/23 134 lb (60.8 kg)  10/04/23 129 lb (58.5 kg)  GEN: Well nourished, well developed in no acute distress NECK: No JVD; No carotid bruits CARDIAC:  RRR, no murmurs, rubs, gallops RESPIRATORY:  Clear to auscultation without rales, wheezing or rhonchi  ABDOMEN: Soft, non-tender, non-distended EXTREMITIES:  No edema; No deformity   ASSESSMENT AND PLAN: .    CAD   Ca score is 48 on CT scan    Pt is asymptomatic. Continue crestor . 150 min exercise weekly.   HTN BP controlled on amlodipine , up in clinic but ok at home 2 gm sodium diet  HLD-LDL 66  Palpitations-not a problem currently. HR up only when she goes to the Doctor  Anxiety-seeing a counselor  Pre-diabetes. A1C 6.1          Dispo: f.u Dr. Avanell Bob 1  yr  Signed, Theotis Flake, PA-C

## 2024-04-10 ENCOUNTER — Other Ambulatory Visit (HOSPITAL_COMMUNITY): Payer: Self-pay

## 2024-04-15 ENCOUNTER — Ambulatory Visit: Attending: Physician Assistant | Admitting: Physician Assistant

## 2024-04-15 ENCOUNTER — Encounter: Payer: Self-pay | Admitting: Physician Assistant

## 2024-04-15 VITALS — BP 150/68 | HR 85 | Ht 64.0 in | Wt 127.6 lb

## 2024-04-15 DIAGNOSIS — R002 Palpitations: Secondary | ICD-10-CM

## 2024-04-15 DIAGNOSIS — R7303 Prediabetes: Secondary | ICD-10-CM

## 2024-04-15 DIAGNOSIS — F419 Anxiety disorder, unspecified: Secondary | ICD-10-CM | POA: Diagnosis not present

## 2024-04-15 DIAGNOSIS — I2510 Atherosclerotic heart disease of native coronary artery without angina pectoris: Secondary | ICD-10-CM

## 2024-04-15 DIAGNOSIS — E7849 Other hyperlipidemia: Secondary | ICD-10-CM

## 2024-04-15 DIAGNOSIS — I10 Essential (primary) hypertension: Secondary | ICD-10-CM | POA: Diagnosis not present

## 2024-04-15 MED ORDER — ROSUVASTATIN CALCIUM 20 MG PO TABS: 20.0000 mg | ORAL_TABLET | Freq: Every day | ORAL | 3 refills | Status: DC

## 2024-04-15 NOTE — Patient Instructions (Addendum)
 Medication Instructions:  Your physician recommends that you continue on your current medications as directed. Please refer to the Current Medication list given to you today.  *If you need a refill on your cardiac medications before your next appointment, please call your pharmacy*  Lab Work: NONE If you have labs (blood work) drawn today and your tests are completely normal, you will receive your results only by: MyChart Message (if you have MyChart) OR A paper copy in the mail If you have any lab test that is abnormal or we need to change your treatment, we will call you to review the results.  Testing/Procedures: NONE  Follow-Up: At [facility], you and your health needs are our priority.  As part of our continuing mission to provide you with exceptional heart care, our providers are all part of one team.  This team includes your primary Cardiologist (physician) and Advanced Practice Providers or APPs (Physician Assistants and Nurse Practitioners) who all work together to provide you with the care you need, when you need it.  Your next appointment:   1 year(s)  Provider:   Dr. Avanell Bob  We recommend signing up for the patient portal called "MyChart".  Sign up information is provided on this After Visit Summary.  MyChart is used to connect with patients for Virtual Visits (Telemedicine).  Patients are able to view lab/test results, encounter notes, upcoming appointments, etc.  Non-urgent messages can be sent to your provider as well.   To learn more about what you can do with MyChart, go to [email or web address].   Other Instructions  YOUR PROVIDER RECOMMENDS THAT YOU DI AT LEAST 150 MINUTES OF EXERCISE WEEKLY.  Two Gram Sodium Diet 2000 mg  What is Sodium? Sodium is a mineral found naturally in many foods. The most significant source of sodium in the diet is table salt, which is about 40% sodium.  Processed, convenience, and preserved foods also contain a large amount of  sodium.  The body needs only 500 mg of sodium daily to function,  A normal diet provides more than enough sodium even if you do not use salt.  Why Limit Sodium? A build up of sodium in the body can cause thirst, increased blood pressure, shortness of breath, and water retention.  Decreasing sodium in the diet can reduce edema and risk of heart attack or stroke associated with high blood pressure.  Keep in mind that there are many other factors involved in these health problems.  Heredity, obesity, lack of exercise, cigarette smoking, stress and what you eat all play a role.  General Guidelines: Do not add salt at the table or in cooking.  One teaspoon of salt contains over 2 grams of sodium. Read food labels Avoid processed and convenience foods Ask your dietitian before eating any foods not dicussed in the menu planning guidelines Consult your physician if you wish to use a salt substitute or a sodium containing medication such as antacids.  Limit milk and milk products to 16 oz (2 cups) per day.  Shopping Hints: READ LABELS!! "Dietetic" does not necessarily mean low sodium. Salt and other sodium ingredients are often added to foods during processing.    Menu Planning Guidelines Food Group Choose More Often Avoid  Beverages (see also the milk group All fruit juices, low-sodium, salt-free vegetables juices, low-sodium carbonated beverages Regular vegetable or tomato juices, commercially softened water used for drinking or cooking  Breads and Cereals Enriched white, wheat, rye and pumpernickel bread, hard rolls and dinner  rolls; muffins, cornbread and waffles; most dry cereals, cooked cereal without added salt; unsalted crackers and breadsticks; low sodium or homemade bread crumbs Bread, rolls and crackers with salted tops; quick breads; instant hot cereals; pancakes; commercial bread stuffing; self-rising flower and biscuit mixes; regular bread crumbs or cracker crumbs  Desserts and Sweets  Desserts and sweets mad with mild should be within allowance Instant pudding mixes and cake mixes  Fats Butter or margarine; vegetable oils; unsalted salad dressings, regular salad dressings limited to 1 Tbs; light, sour and heavy cream Regular salad dressings containing bacon fat, bacon bits, and salt pork; snack dips made with instant soup mixes or processed cheese; salted nuts  Fruits Most fresh, frozen and canned fruits Fruits processed with salt or sodium-containing ingredient (some dried fruits are processed with sodium sulfites        Vegetables Fresh, frozen vegetables and low- sodium canned vegetables Regular canned vegetables, sauerkraut, pickled vegetables, and others prepared in brine; frozen vegetables in sauces; vegetables seasoned with ham, bacon or salt pork  Condiments, Sauces, Miscellaneous  Salt substitute with physician's approval; pepper, herbs, spices; vinegar, lemon or lime juice; hot pepper sauce; garlic powder, onion powder, low sodium soy sauce (1 Tbs.); low sodium condiments (ketchup, chili sauce, mustard) in limited amounts (1 tsp.) fresh ground horseradish; unsalted tortilla chips, pretzels, potato chips, popcorn, salsa (1/4 cup) Any seasoning made with salt including garlic salt, celery salt, onion salt, and seasoned salt; sea salt, rock salt, kosher salt; meat tenderizers; monosodium glutamate; mustard, regular soy sauce, barbecue, sauce, chili sauce, teriyaki sauce, steak sauce, Worcestershire sauce, and most flavored vinegars; canned gravy and mixes; regular condiments; salted snack foods, olives, picles, relish, horseradish sauce, catsup   Food preparation: Try these seasonings Meats:    Pork Sage, onion Serve with applesauce  Chicken Poultry seasoning, thyme, parsley Serve with cranberry sauce  Lamb Curry powder, rosemary, garlic, thyme Serve with mint sauce or jelly  Veal Marjoram, basil Serve with current jelly, cranberry sauce  Beef Pepper, bay leaf Serve with  dry mustard, unsalted chive butter  [place] leaf, dill Serve with unsalted lemon butter, unsalted parsley butter  Vegetables:    Asparagus Lemon juice   Broccoli Lemon juice   Carrots Mustard dressing parsley, mint, nutmeg, glazed with unsalted butter and sugar   Green beans Marjoram, lemon juice, nutmeg,dill seed   Tomatoes Basil, marjoram, onion   Spice /blend for [employer]" 4 tsp ground thyme 1 tsp ground sage 3 tsp ground rosemary 4 tsp ground marjoram   Test your knowledge A product that says "Salt Free" may still contain sodium. True or False Garlic Powder and Hot Pepper Sauce an be used as alternative seasonings.True or False Processed foods have more sodium than fresh foods.  True or False Canned Vegetables have less sodium than froze True or False   WAYS TO DECREASE YOUR SODIUM INTAKE Avoid the use of added salt in cooking and at the table.  Table salt (and other prepared seasonings which contain salt) is probably one of the greatest sources of sodium in the diet.  Unsalted foods can gain flavor from the sweet, sour, and butter taste sensations of herbs and spices.  Instead of using salt for seasoning, try the following seasonings with the foods listed.  Remember: how you use them to enhance natural food flavors is limited only by your creativity... Allspice-Meat, fish, eggs, fruit, peas, red and yellow vegetables Almond Extract-Fruit baked goods Anise Seed-Sweet breads, fruit, carrots, beets, cottage cheese,  cookies (tastes like licorice) Basil-Meat, fish, eggs, vegetables, rice, vegetables salads, soups, sauces Bay Leaf-Meat, fish, stews, poultry Burnet-Salad, vegetables (cucumber-like flavor) Caraway Seed-Bread, cookies, cottage cheese, meat, vegetables, cheese, rice Cardamon-Baked goods, fruit, soups Celery Powder or seed-Salads, salad dressings, sauces, meatloaf, soup, bread.Do not use  celery salt Chervil-Meats, salads, fish, eggs, vegetables, cottage cheese  (parsley-like flavor) Chili Power-Meatloaf, chicken cheese, corn, eggplant, egg dishes Chives-Salads cottage cheese, egg dishes, soups, vegetables, sauces Cilantro-Salsa, casseroles Cinnamon-Baked goods, fruit, pork, lamb, chicken, carrots Cloves-Fruit, baked goods, fish, pot roast, green beans, beets, carrots Coriander-Pastry, cookies, meat, salads, cheese (lemon-orange flavor) Cumin-Meatloaf, fish,cheese, eggs, cabbage,fruit pie (caraway flavor) [employer], fruit, eggs, fish, poultry, cottage cheese, vegetables Dill Seed-Meat, cottage cheese, poultry, vegetables, fish, salads, bread Fennel Seed-Bread, cookies, apples, pork, eggs, fish, beets, cabbage, cheese, Licorice-like flavor Garlic-(buds or powder) Salads, meat, poultry, fish, bread, butter, vegetables, potatoes.Do not  use garlic salt Ginger-Fruit, vegetables, baked goods, meat, fish, poultry Horseradish Root-Meet, vegetables, butter Lemon Juice or Extract-Vegetables, fruit, tea, baked goods, fish salads Mace-Baked goods fruit, vegetables, fish, poultry (taste like nutmeg) Maple Extract-Syrups Marjoram-Meat, chicken, fish, vegetables, breads, green salads (taste like Sage) Mint-Tea, lamb, sherbet, vegetables, desserts, carrots, cabbage Mustard, Dry or Seed-Cheese, eggs, meats, vegetables, poultry Nutmeg-Baked goods, fruit, chicken, eggs, vegetables, desserts Onion Powder-Meat, fish, poultry, vegetables, cheese, eggs, bread, rice salads (Do not use   Onion salt) Orange Extract-Desserts, baked goods Oregano-Pasta, eggs, cheese, onions, pork, lamb, fish, chicken, vegetables, green salads Paprika-Meat, fish, poultry, eggs, cheese, vegetables Parsley Flakes-Butter, vegetables, meat fish, poultry, eggs, bread, salads (certain forms may   Contain sodium Pepper-Meat fish, poultry, vegetables, eggs Peppermint Extract-Desserts, baked goods Poppy Seed-Eggs, bread, cheese, fruit dressings, baked goods, noodles, vegetables, cottage   [employer], poultry, meat, fish, cauliflower, turnips,eggs bread Saffron-Rice, bread, veal, chicken, fish, eggs Sage-Meat, fish, poultry, onions, eggplant, tomateos, pork, stews Savory-Eggs, salads, poultry, meat, rice, vegetables, soups, pork Tarragon-Meat, poultry, fish, eggs, butter, vegetables (licorice-like flavor)  Thyme-Meat, poultry, fish, eggs, vegetables, (clover-like flavor), sauces, soups Tumeric-Salads, butter, eggs, fish, rice, vegetables (saffron-like flavor) Vanilla Extract-Baked goods, candy Vinegar-Salads, vegetables, meat marinades Walnut Extract-baked goods, candy   2. Choose your Foods Wisely   The following is a list of foods to avoid which are high in sodium:  Meats-Avoid all smoked, canned, salt cured, dried and kosher meat and fish as well as Anchovies   [employer] meats:Bologna, Liverwurst, Pastrami Canned meat or fish  Marinated herring Caviar    Pepperoni Corned Beef   Pizza Dried chipped beef  Salami Frozen breaded fish or meat Salt pork Frankfurters or hot dogs  Sardines Gefilte fish   Sausage Ham (boiled ham, Proscuitto Smoked butt    spiced ham)   Spam      TV Dinners Vegetables Canned vegetables (Regular) Relish Canned mushrooms  Sauerkraut Olives    Tomato juice Pickles  Bakery and Dessert Products Canned puddings  Cream pies Cheesecake   Decorated cakes Cookies  Beverages/Juices Tomato juice, regular  Gatorade   V-8 vegetable juice, regular  Breads and Cereals Biscuit mixes   Salted potato chips, corn chips, pretzels Bread stuffing mixes  Salted crackers and rolls Pancake and waffle mixes Self-rising flour  Seasonings Accent    Meat sauces Barbecue sauce  Meat tenderizer Catsup    Monosodium glutamate (MSG) Celery salt   Onion salt Chili sauce   Prepared mustard Garlic salt   Salt, seasoned salt, sea salt Gravy mixes   Soy sauce Horseradish  Steak sauce Ketchup   Tartar  sauce Lite salt    Teriyaki sauce Marinade mixes   Worcestershire sauce  Others Baking powder   Cocoa and cocoa mixes Baking soda   Commercial casserole mixes Candy-caramels, chocolate  Dehydrated soups    Bars, fudge,nougats  Instant rice and pasta mixes Canned broth or soup  Maraschino cherries Cheese, aged and processed cheese and cheese spreads  Learning Assessment Quiz  Indicated T (for True) or F (for False) for each of the following statements:  _____ Fresh fruits and vegetables and unprocessed grains are generally low in sodium _____ Water may contain a considerable amount of sodium, depending on the source _____ You can always tell if a food is high in sodium by tasting it _____ Certain laxatives my be high in sodium and should be avoided unless prescribed   by a physician or pharmacist _____ Salt substitutes may be used freely by anyone on a sodium restricted diet _____ Sodium is present in table salt, food additives and as a natural component of   most foods _____ Table salt is approximately 90% sodium _____ Limiting sodium intake may help prevent excess fluid accumulation in the body _____ On a sodium-restricted diet, seasonings such as bouillon soy sauce, and    cooking wine should be used in place of table salt _____ On an ingredient list, a product which lists monosodium glutamate as the first   ingredient is an appropriate food to include on a low sodium diet  Circle the best answer(s) to the following statements (Hint: there may be more than one correct answer)  11. On a low-sodium diet, some acceptable snack items are:    A. Olives  F. Bean dip   K. Grapefruit juice    B. Salted Pretzels G. Commercial Popcorn   L. Canned peaches    C. Carrot Sticks  H. Bouillon   M. Unsalted nuts   D. [place] fries  I. Peanut butter crackers N. Salami   E. Sweet pickles J. Tomato Juice   O. Pizza  12.  Seasonings that may be used freely on a reduced - sodium diet  include   A. Lemon wedges F.Monosodium glutamate K. Celery seed    B.Soysauce   G. Pepper   L. Mustard powder   C. Sea salt  H. Cooking wine  M. Onion flakes   D. Vinegar  E. Prepared horseradish N. Salsa   E. Sage   J. Worcestershire sauce  O. Chutney

## 2024-04-16 ENCOUNTER — Encounter: Payer: 59 | Admitting: Internal Medicine

## 2024-05-02 ENCOUNTER — Encounter: Payer: Self-pay | Admitting: Internal Medicine

## 2024-05-02 ENCOUNTER — Other Ambulatory Visit: Payer: Self-pay | Admitting: Internal Medicine

## 2024-05-02 ENCOUNTER — Ambulatory Visit: Payer: 59 | Admitting: Internal Medicine

## 2024-05-02 ENCOUNTER — Other Ambulatory Visit (HOSPITAL_COMMUNITY): Payer: Self-pay

## 2024-05-02 VITALS — BP 158/74 | HR 101 | Temp 98.6°F | Ht 62.8 in | Wt 129.8 lb

## 2024-05-02 DIAGNOSIS — E7841 Elevated Lipoprotein(a): Secondary | ICD-10-CM

## 2024-05-02 DIAGNOSIS — I10 Essential (primary) hypertension: Secondary | ICD-10-CM | POA: Diagnosis not present

## 2024-05-02 DIAGNOSIS — E785 Hyperlipidemia, unspecified: Secondary | ICD-10-CM

## 2024-05-02 DIAGNOSIS — Z23 Encounter for immunization: Secondary | ICD-10-CM

## 2024-05-02 DIAGNOSIS — Z0000 Encounter for general adult medical examination without abnormal findings: Secondary | ICD-10-CM | POA: Diagnosis not present

## 2024-05-02 DIAGNOSIS — R931 Abnormal findings on diagnostic imaging of heart and coronary circulation: Secondary | ICD-10-CM

## 2024-05-02 DIAGNOSIS — Z79899 Other long term (current) drug therapy: Secondary | ICD-10-CM | POA: Diagnosis not present

## 2024-05-02 MED ORDER — AMLODIPINE BESYLATE 2.5 MG PO TABS
2.5000 mg | ORAL_TABLET | Freq: Every day | ORAL | 3 refills | Status: AC
  Filled 2024-05-02 – 2024-05-06 (×2): qty 90, 90d supply, fill #0
  Filled 2024-08-05: qty 90, 90d supply, fill #1
  Filled 2024-11-02: qty 90, 90d supply, fill #2

## 2024-05-02 NOTE — Progress Notes (Signed)
 Chief Complaint  Patient presents with   Annual Exam    Pt states she will do lab after visit.     HPI: Patient  Sharon Wells  63 y.o. comes in today for Preventive Health Care visit   Counseling  for the anxiety   and  has seen gyne .risk for hrt at this time.  Bp 134/7 and  120 later this am . Seems ok at home but increase at visits  taking low dose amlodipine   Taking statin med for high ct calcium  score elevation and elevated lipo a     Health Maintenance  Topic Date Due   COVID-19 Vaccine (3 - Pfizer risk series) 05/18/2024 (Originally 08/31/2020)   Zoster Vaccines- Shingrix (1 of 2) 08/02/2024 (Originally 10/19/1980)   Pneumococcal Vaccine 20-33 Years old (1 of 2 - PCV) 05/02/2025 (Originally 10/19/1980)   HIV Screening  05/02/2025 (Originally [birth year]1974)   INFLUENZA VACCINE  09/01/2049 (Originally 06/14/2024)   MAMMOGRAM  11/13/2025   Cervical Cancer Screening (HPV/Pap Cotest)  09/14/2027   Colonoscopy  10/22/2033   DTaP/Tdap/Td (3 - Td or Tdap) 05/02/2034   Hepatitis C Screening  Completed   HPV VACCINES  Aged Out   Meningococcal B Vaccine  Aged Out   Health Maintenance Review LIFESTYLE:  Exercise:    walking   but trying   Tobacco/ETS: n Alcohol:  n Sugar beverages:  2 sweet tea per day.  At most .  Sleep:6  average  Drug use: no HH of 1  no pets  Work:   40 + PJ time   ROS:  GEN/ HEENT: No fever, significant weight changes sweats headaches vision problems hearing changes, CV/ PULM; No chest pain shortness of breath cough, syncope,edema  change in exercise tolerance. GI /GU: No adominal pain, vomiting, change in bowel habits. No blood in the stool. No significant GU symptoms. SKIN/HEME: ,no acute skin rashes suspicious lesions or bleeding. No lymphadenopathy, nodules, masses.  NEURO/ PSYCH:  No neurologic signs such as weakness numbness. No depression anxiety. IMM/ Allergy : No unusual infections.  Allergy  .   REST of 12 system review negative except as per  HPI   Past Medical History:  Diagnosis Date   Chicken pox    Fibroids    With history of anemia   High cholesterol    Hyperlipidemia     Past Surgical History:  Procedure Laterality Date   COLONOSCOPY  2013   In [place]    NO PAST SURGERIES      Family History  Problem Relation Age of Onset   Sarcoidosis Mother        Deceased in her 12s   Hypertension Sister    Diabetes Sister    Sarcoidosis Sister    COPD Sister        Emphysema   Pulmonary embolism Sister        Apparently am provoked   Heart disease Maternal Grandmother    Breast cancer Neg Hx    Colon cancer Neg Hx    Esophageal cancer Neg Hx    Stomach cancer Neg Hx    Rectal cancer Neg Hx     Social History   Socioeconomic History   Marital status: Divorced    Spouse name: Not on file   Number of children: Not on file   Years of education: Not on file   Highest education level: Master's degree (e.g., MA, MS, MEng, MEd, MSW, MBA)  Occupational History   Not on file  Tobacco Use   Smoking status: Never   Smokeless tobacco: Never  Vaping Use   Vaping status: Never Used  Substance and Sexual Activity   Alcohol use: No   Drug use: Never   Sexual activity: Not Currently    Partners: Male    Birth control/protection: Post-menopausal    Comment: 1ST intercourse- 15, partners- 2,  Other Topics Concern   Not on file  Social History Narrative   4-5 hours of sleep per night   Lives Alone separated    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   Social Drivers of Corporate investment banker Strain: Low Risk  (05/01/2024)   Overall Financial Resource Strain (CARDIA)    Difficulty of Paying Living Expenses: Not hard at all  Food Insecurity: No Food Insecurity (05/01/2024)   Hunger Vital Sign     Worried About Running Out of Food in the Last Year: Never true    Ran Out of Food in the Last Year: Never true  Transportation Needs: No Transportation Needs (05/01/2024)   PRAPARE - Administrator, Civil Service (Medical): No    Lack of Transportation (Non-Medical): No  Physical Activity: Insufficiently Active (05/01/2024)   Exercise Vital Sign    Days of Exercise per Week: 3 days    Minutes of Exercise per Session: 30 min  Stress: No Stress Concern Present (05/01/2024)   [employer] of Occupational Health - Occupational Stress Questionnaire    Feeling of Stress: Not at all  Social Connections: Moderately Integrated (05/01/2024)   Social Connection and Isolation Panel    Frequency of Communication with Friends and Family: More than three times a week    Frequency of Social Gatherings with Friends and Family: Not on file    Attends Religious Services: More than 4 times per year    Active Member of [employer] or Organizations: Yes    Attends Engineer, structural: More than 4 times per year    Marital Status: Divorced    Outpatient Medications Prior to Visit  Medication Sig Dispense Refill   Ascorbic Acid (VITA-C PO) Take by mouth.     cyanocobalamin 2000 MCG tablet Take 2,000 mcg by mouth daily.     famotidine  (PEPCID ) 20 MG tablet Take 20 mg by mouth daily.     NON FORMULARY daily. [employer]: Multivitamin Complex with TRE-EN-EN Grain Concentrates     rosuvastatin  (CRESTOR ) 20 MG tablet Take 1 tablet (20 mg total) by mouth daily. 90 tablet 3   amLODipine  (NORVASC ) 2.5 MG tablet Take 1 tablet (2.5 mg total) by mouth daily. 30 tablet 3   No facility-administered medications prior to visit.     EXAM:  BP (!) 158/74 (BP Location: Left Arm, Patient Position: Sitting, Cuff Size: Normal)   Pulse (!) 101   Temp 98.6 F (37 C)   Ht 5' 2.8 (1.595 m)   Wt 129 lb 12.8 oz (58.9 kg)   LMP 12/15/2013   SpO2 98%   BMI 23.14 kg/m   Body mass index is 23.14 kg/m. Wt  Readings from Last 3 Encounters:  05/02/24 129 lb 12.8 oz (58.9 kg)  04/15/24 127 lb 9.6 oz (57.9 kg)  10/23/23 134 lb (60.8  kg)  Bp correlated with  home machine she brought with her today   Physical Exam: Vital signs reviewed AVW:UJWJ is a well-developed well-nourished alert cooperative    who appearsr stated age in no acute distress.  HEENT: normocephalic atraumatic , Eyes: PERRL EOM's full, conjunctiva clear, Nares: paten,t no deformity discharge or tenderness., Ears: no deformity EAC's clear TMs with normal landmarks. Mouth: clear OP, no lesions, edema.  Moist mucous membranes. Dentition in adequate repair. NECK: supple without masses, thyromegaly or bruits. CHEST/PULM:  Clear to auscultation and percussion breath sounds equal no wheeze , rales or rhonchi. No chest wall deformities or tenderness. Breast: normal by inspection . No dimpling, discharge, masses, tenderness or discharge . CV: PMI is nondisplaced, S1 S2 no gallops, murmurs, rubs. Peripheral pulses are full without delay.No JVD .  ABDOMEN: Bowel sounds normal nontender  No guard or rebound, no hepato splenomegal no CVA tenderness.Extremtities:  No clubbing cyanosis or edema, no acute joint swelling or redness no focal atrophy NEURO:  Oriented x3, cranial nerves 3-12 appear to be intact, no obvious focal weakness,gait within normal limits no abnormal reflexes or asymmetrical SKIN: No acute rashes normal turgor, color, no bruising or petechiae. PSYCH: Oriented, good eye contact, no obvious depression anxiety, cognition and judgment appear normal. LN: no cervical axillary l adenopathy  Lab Results  Component Value Date   WBC 3.5 (L) 08/14/2023   HGB 12.8 08/14/2023   HCT 39.9 08/14/2023   PLT 278.0 08/14/2023   GLUCOSE 108 (H) 11/10/2023   CHOL 137 04/13/2023   TRIG 89.0 04/13/2023   HDL 52.90 04/13/2023   LDLCALC 66 04/13/2023   ALT 19 04/13/2023   AST 23 04/13/2023   NA 140 11/10/2023   K 4.0 11/10/2023   CL 102  11/10/2023   CREATININE 0.61 11/10/2023   BUN 9 11/10/2023   CO2 27 11/10/2023   TSH 1.18 04/13/2023   HGBA1C 6.1 (H) 09/22/2023    BP Readings from Last 3 Encounters:  05/02/24 (!) 158/74  04/15/24 (!) 150/68  10/23/23 105/66    Lab results planreviewed with patient   ASSESSMENT AND PLAN:  Discussed the following assessment and plan:    ICD-10-CM   1. Visit for preventive health examination  Z00.00     2. Elevated coronary artery calcium  score  R93.1     3. Medication management  Z79.899     4. White coat syndrome with diagnosis of hypertension  I10     5. Essential hypertension  I10     6. Elevated Lp(a)  E78.41     7. Hyperlipidemia, unspecified hyperlipidemia type  E78.5     8. Need for tetanus booster  Z23 Tdap vaccine greater than or equal to 7yo IM    Disc controlling cv risk factors she  is aware  continue   Return in about 1 year (around 05/02/2025) for depending on results and bp control .  Patient Care Team: Kennia Vanvorst, Joaquim Muir, MD as PCP - General (Internal Medicine) Elmyra Haggard, MD as PCP - Cardiology (Cardiology) Pyrtle, Amber Bail, MD as Consulting Physician (Gastroenterology) Patient Instructions  Good to see you today  Plan fasting lab   make lab appt.  Tdap today . Decrease  bp monitoring time to avoid anxiety effects Maybe  3 days every month and if at goal   ocassional monitoring  Send in request before cruise if you want to try th transderm scopolomine.     Heather Streeper K. Jermani Eberlein M.D.

## 2024-05-02 NOTE — Progress Notes (Signed)
 Future labs  from cpe and med check

## 2024-05-02 NOTE — Patient Instructions (Addendum)
 Good to see you today  Plan fasting lab   make lab appt.  Tdap today . Decrease  bp monitoring time to avoid anxiety effects Maybe  3 days every month and if at goal   ocassional monitoring  Send in request before cruise if you want to try th transderm scopolomine.

## 2024-05-06 ENCOUNTER — Other Ambulatory Visit (HOSPITAL_COMMUNITY): Payer: Self-pay

## 2024-05-10 ENCOUNTER — Other Ambulatory Visit: Payer: Self-pay | Admitting: Internal Medicine

## 2024-05-10 ENCOUNTER — Other Ambulatory Visit (HOSPITAL_COMMUNITY): Payer: Self-pay

## 2024-05-10 DIAGNOSIS — I2510 Atherosclerotic heart disease of native coronary artery without angina pectoris: Secondary | ICD-10-CM

## 2024-05-10 DIAGNOSIS — E7849 Other hyperlipidemia: Secondary | ICD-10-CM

## 2024-05-13 ENCOUNTER — Other Ambulatory Visit (HOSPITAL_COMMUNITY): Payer: Self-pay

## 2024-05-13 MED ORDER — ROSUVASTATIN CALCIUM 20 MG PO TABS
20.0000 mg | ORAL_TABLET | Freq: Every day | ORAL | 3 refills | Status: DC
  Filled 2024-05-13: qty 90, 90d supply, fill #0
  Filled 2024-08-27: qty 90, 90d supply, fill #1
  Filled 2024-08-28: qty 90, 90d supply, fill #0

## 2024-05-28 ENCOUNTER — Other Ambulatory Visit (INDEPENDENT_AMBULATORY_CARE_PROVIDER_SITE_OTHER)

## 2024-05-28 DIAGNOSIS — Z0000 Encounter for general adult medical examination without abnormal findings: Secondary | ICD-10-CM

## 2024-05-28 DIAGNOSIS — E7841 Elevated Lipoprotein(a): Secondary | ICD-10-CM | POA: Diagnosis not present

## 2024-05-28 DIAGNOSIS — Z79899 Other long term (current) drug therapy: Secondary | ICD-10-CM

## 2024-05-28 DIAGNOSIS — R931 Abnormal findings on diagnostic imaging of heart and coronary circulation: Secondary | ICD-10-CM | POA: Diagnosis not present

## 2024-05-28 DIAGNOSIS — I10 Essential (primary) hypertension: Secondary | ICD-10-CM

## 2024-05-28 DIAGNOSIS — E785 Hyperlipidemia, unspecified: Secondary | ICD-10-CM | POA: Diagnosis not present

## 2024-05-28 LAB — LIPID PANEL
Cholesterol: 146 mg/dL (ref 0–200)
HDL: 65.2 mg/dL (ref >39.00)
LDL Cholesterol: 70 mg/dL (ref 0–99)
NonHDL: 80.89
Total CHOL/HDL Ratio: 2
Triglycerides: 56 mg/dL (ref 0.0–149.0)
VLDL: 11.2 mg/dL (ref 0.0–40.0)

## 2024-05-28 LAB — TSH: TSH: 2.27 u[IU]/mL (ref 0.35–5.50)

## 2024-05-28 LAB — CBC WITH DIFFERENTIAL/PLATELET
Basophils Absolute: 0 K/uL (ref 0.0–0.1)
Basophils Relative: 1.1 % (ref 0.0–3.0)
Eosinophils Absolute: 0 K/uL (ref 0.0–0.7)
Eosinophils Relative: 1.7 % (ref 0.0–5.0)
HCT: 37.3 % (ref 36.0–46.0)
Hemoglobin: 12.5 g/dL (ref 12.0–15.0)
Lymphocytes Relative: 44.7 % (ref 12.0–46.0)
Lymphs Abs: 1.2 K/uL (ref 0.7–4.0)
MCHC: 33.4 g/dL (ref 30.0–36.0)
MCV: 85 fl (ref 78.0–100.0)
Monocytes Absolute: 0.2 K/uL (ref 0.1–1.0)
Monocytes Relative: 7 % (ref 3.0–12.0)
Neutro Abs: 1.2 K/uL — ABNORMAL LOW (ref 1.4–7.7)
Neutrophils Relative %: 45.5 % (ref 43.0–77.0)
Platelets: 222 K/uL (ref 150.0–400.0)
RBC: 4.4 Mil/uL (ref 3.87–5.11)
RDW: 12.9 % (ref 11.5–15.5)
WBC: 2.7 K/uL — ABNORMAL LOW (ref 4.0–10.5)

## 2024-05-28 LAB — BASIC METABOLIC PANEL WITH GFR
BUN: 10 mg/dL (ref 6–23)
CO2: 32 meq/L (ref 19–32)
Calcium: 9.7 mg/dL (ref 8.4–10.5)
Chloride: 102 meq/L (ref 96–112)
Creatinine, Ser: 0.63 mg/dL (ref 0.40–1.20)
GFR: 93.63 mL/min (ref >60.00)
Glucose, Bld: 96 mg/dL (ref 70–99)
Potassium: 3.9 meq/L (ref 3.5–5.1)
Sodium: 140 meq/L (ref 135–145)

## 2024-05-28 LAB — HEPATIC FUNCTION PANEL
ALT: 17 U/L (ref 0–35)
AST: 18 U/L (ref 0–37)
Albumin: 4.7 g/dL (ref 3.5–5.2)
Alkaline Phosphatase: 72 U/L (ref 39–117)
Bilirubin, Direct: 0.2 mg/dL (ref 0.0–0.3)
Total Bilirubin: 0.7 mg/dL (ref 0.2–1.2)
Total Protein: 7.5 g/dL (ref 6.0–8.3)

## 2024-05-28 LAB — HEMOGLOBIN A1C: Hgb A1c MFr Bld: 6.2 % (ref 4.6–6.5)

## 2024-05-30 ENCOUNTER — Encounter: Payer: Self-pay | Admitting: Internal Medicine

## 2024-06-05 ENCOUNTER — Other Ambulatory Visit: Payer: Self-pay | Admitting: Family

## 2024-06-05 MED ORDER — SCOPOLAMINE 1 MG/3DAYS TD PT72
1.0000 | MEDICATED_PATCH | TRANSDERMAL | 0 refills | Status: DC
  Filled 2024-06-05: qty 10, 30d supply, fill #0
  Filled 2024-06-08 (×2): qty 1, 3d supply, fill #0

## 2024-06-06 ENCOUNTER — Other Ambulatory Visit (HOSPITAL_COMMUNITY): Payer: Self-pay

## 2024-06-08 ENCOUNTER — Other Ambulatory Visit (HOSPITAL_BASED_OUTPATIENT_CLINIC_OR_DEPARTMENT_OTHER): Payer: Self-pay

## 2024-06-17 ENCOUNTER — Ambulatory Visit: Payer: Self-pay | Admitting: Internal Medicine

## 2024-06-17 DIAGNOSIS — Z79899 Other long term (current) drug therapy: Secondary | ICD-10-CM

## 2024-06-17 DIAGNOSIS — I10 Essential (primary) hypertension: Secondary | ICD-10-CM

## 2024-06-17 NOTE — Progress Notes (Signed)
 WBC  low again  and this may be  ok and transient. Rest of labs look good   I suggest  repeat cbc diff in  3-4 months ... to ensure stability f and follow up or the low WBC . NO  OV needed if well

## 2024-06-19 ENCOUNTER — Telehealth: Payer: Self-pay

## 2024-06-19 NOTE — Telephone Encounter (Signed)
 Copied from CRM [id number]. Topic: Clinical - Lab/Test Results >> Jun 18, 2024 12:24 PM Corin V wrote: Reason for CRM: Patient called and stated she had already viewed results in MyChart and did not have any questions. She would like a MyChart message when follow up labs are ordered so she can schedule.

## 2024-07-15 ENCOUNTER — Encounter: Payer: Self-pay | Admitting: Internal Medicine

## 2024-07-16 ENCOUNTER — Other Ambulatory Visit (HOSPITAL_COMMUNITY): Payer: Self-pay

## 2024-07-23 ENCOUNTER — Other Ambulatory Visit: Payer: Self-pay | Admitting: Obstetrics and Gynecology

## 2024-07-23 DIAGNOSIS — Z1231 Encounter for screening mammogram for malignant neoplasm of breast: Secondary | ICD-10-CM

## 2024-08-01 ENCOUNTER — Encounter: Payer: Self-pay | Admitting: Internal Medicine

## 2024-08-05 NOTE — Telephone Encounter (Signed)
 I am not in the office  . Just read your message  How are you doing now? Sharon Wells   You can get syncope from severe pain . Gi  dysfunction  Is the abd pain better ?  If not getting better or concern we can do a fu appt  otherwise .

## 2024-08-06 NOTE — Telephone Encounter (Signed)
 Attempted to reach pt. Left a voicemail to call us back.

## 2024-08-28 ENCOUNTER — Other Ambulatory Visit: Payer: Self-pay

## 2024-08-28 ENCOUNTER — Other Ambulatory Visit (HOSPITAL_COMMUNITY): Payer: Self-pay

## 2024-08-29 ENCOUNTER — Other Ambulatory Visit: Payer: Self-pay

## 2024-09-04 ENCOUNTER — Ambulatory Visit: Admitting: Podiatry

## 2024-09-04 ENCOUNTER — Encounter: Payer: Self-pay | Admitting: Podiatry

## 2024-09-04 DIAGNOSIS — M2041 Other hammer toe(s) (acquired), right foot: Secondary | ICD-10-CM

## 2024-09-05 NOTE — Progress Notes (Signed)
 Subjective:   Patient ID: Fidela LITTIE Ned, female   DOB: 63 y.o.   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  At this point courtesy debridement of the lesion continue wider shoes reappoint as symptoms indicate

## 2024-09-16 ENCOUNTER — Other Ambulatory Visit

## 2024-09-17 ENCOUNTER — Other Ambulatory Visit (INDEPENDENT_AMBULATORY_CARE_PROVIDER_SITE_OTHER)

## 2024-09-17 DIAGNOSIS — I10 Essential (primary) hypertension: Secondary | ICD-10-CM | POA: Diagnosis not present

## 2024-09-17 DIAGNOSIS — Z79899 Other long term (current) drug therapy: Secondary | ICD-10-CM

## 2024-09-17 LAB — CBC WITH DIFFERENTIAL/PLATELET
Basophils Absolute: 0 K/uL (ref 0.0–0.1)
Basophils Relative: 0.6 % (ref 0.0–3.0)
Eosinophils Absolute: 0 K/uL (ref 0.0–0.7)
Eosinophils Relative: 1.5 % (ref 0.0–5.0)
HCT: 36.8 % (ref 36.0–46.0)
Hemoglobin: 12.1 g/dL (ref 12.0–15.0)
Lymphocytes Relative: 45.5 % (ref 12.0–46.0)
Lymphs Abs: 1.5 K/uL (ref 0.7–4.0)
MCHC: 32.9 g/dL (ref 30.0–36.0)
MCV: 86.5 fl (ref 78.0–100.0)
Monocytes Absolute: 0.2 K/uL (ref 0.1–1.0)
Monocytes Relative: 7.6 % (ref 3.0–12.0)
Neutro Abs: 1.5 K/uL (ref 1.4–7.7)
Neutrophils Relative %: 44.8 % (ref 43.0–77.0)
Platelets: 243 K/uL (ref 150.0–400.0)
RBC: 4.26 Mil/uL (ref 3.87–5.11)
RDW: 12.8 % (ref 11.5–15.5)
WBC: 3.3 K/uL — ABNORMAL LOW (ref 4.0–10.5)

## 2024-09-29 ENCOUNTER — Ambulatory Visit: Payer: Self-pay | Admitting: Internal Medicine

## 2024-09-29 NOTE — Progress Notes (Signed)
 Cbc back to her baseline  low but stable differential  follow  bcdiff count every 6-12 months or as indicated

## 2024-10-07 ENCOUNTER — Emergency Department (HOSPITAL_COMMUNITY)
Admission: EM | Admit: 2024-10-07 | Discharge: 2024-10-07 | Disposition: A | Discharge status: Home / Self Care | Attending: Emergency Medicine | Admitting: Emergency Medicine

## 2024-10-07 ENCOUNTER — Emergency Department (HOSPITAL_COMMUNITY)

## 2024-10-07 ENCOUNTER — Ambulatory Visit: Payer: Self-pay

## 2024-10-07 ENCOUNTER — Encounter (HOSPITAL_COMMUNITY): Payer: Self-pay

## 2024-10-07 DIAGNOSIS — I2510 Atherosclerotic heart disease of native coronary artery without angina pectoris: Secondary | ICD-10-CM | POA: Insufficient documentation

## 2024-10-07 DIAGNOSIS — R519 Headache, unspecified: Secondary | ICD-10-CM | POA: Insufficient documentation

## 2024-10-07 DIAGNOSIS — H43812 Vitreous degeneration, left eye: Secondary | ICD-10-CM | POA: Insufficient documentation

## 2024-10-07 DIAGNOSIS — H538 Other visual disturbances: Secondary | ICD-10-CM | POA: Diagnosis present

## 2024-10-07 HISTORY — DX: Essential (primary) hypertension: I10

## 2024-10-07 LAB — CBC WITH DIFFERENTIAL/PLATELET
Abs Immature Granulocytes: 0.01 K/uL (ref 0.00–0.07)
Basophils Absolute: 0 K/uL (ref 0.0–0.1)
Basophils Relative: 1 %
Eosinophils Absolute: 0.1 K/uL (ref 0.0–0.5)
Eosinophils Relative: 2 %
HCT: 40.3 % (ref 36.0–46.0)
Hemoglobin: 12.9 g/dL (ref 12.0–15.0)
Immature Granulocytes: 0 %
Lymphocytes Relative: 29 %
Lymphs Abs: 1.1 K/uL (ref 0.7–4.0)
MCH: 28 pg (ref 26.0–34.0)
MCHC: 32 g/dL (ref 30.0–36.0)
MCV: 87.4 fL (ref 80.0–100.0)
Monocytes Absolute: 0.2 K/uL (ref 0.1–1.0)
Monocytes Relative: 5 %
Neutro Abs: 2.5 K/uL (ref 1.7–7.7)
Neutrophils Relative %: 63 %
Platelets: 265 K/uL (ref 150–400)
RBC: 4.61 MIL/uL (ref 3.87–5.11)
RDW: 12.3 % (ref 11.5–15.5)
WBC: 4 K/uL (ref 4.0–10.5)
nRBC: 0 % (ref 0.0–0.2)

## 2024-10-07 LAB — COMPREHENSIVE METABOLIC PANEL WITH GFR
ALT: 17 U/L (ref 0–44)
AST: 19 U/L (ref 15–41)
Albumin: 4.4 g/dL (ref 3.5–5.0)
Alkaline Phosphatase: 67 U/L (ref 38–126)
Anion gap: 11 (ref 5–15)
BUN: 12 mg/dL (ref 8–23)
CO2: 27 mmol/L (ref 22–32)
Calcium: 9.4 mg/dL (ref 8.9–10.3)
Chloride: 102 mmol/L (ref 98–111)
Creatinine, Ser: 0.67 mg/dL (ref 0.44–1.00)
GFR, Estimated: >60 mL/min (ref >60)
Glucose, Bld: 106 mg/dL — ABNORMAL HIGH (ref 70–99)
Potassium: 4.1 mmol/L (ref 3.5–5.1)
Sodium: 140 mmol/L (ref 135–145)
Total Bilirubin: 0.6 mg/dL (ref 0.0–1.2)
Total Protein: 7.8 g/dL (ref 6.5–8.1)

## 2024-10-07 LAB — MAGNESIUM: Magnesium: 2.2 mg/dL (ref 1.7–2.4)

## 2024-10-07 NOTE — ED Provider Triage Note (Signed)
 Emergency Medicine Provider Triage Evaluation Note  EDISON WOLLSCHLAGER , a 63 y.o. female  was evaluated in triage.  Pt complains of vision changes and a headache.  States that today she has seen increased floaters in her vision.  Patient denies any other neurological changes.  Review of Systems  Positive: Vision changes Negative: Fevers Physical Exam  BP (!) 174/93 (BP Location: Right Arm)   Pulse (!) 125   Temp 98.2 F (36.8 C)   Resp 17   Ht 5' 3 (1.6 m)   Wt 59.5 kg   LMP 12/15/2013   SpO2 98%   BMI 23.24 kg/m  Gen:   Awake, no distress   Resp:  Normal effort  MSK:   Moves extremities without difficulty  Other:    Medical Decision Making  Medically screening exam initiated at 4:17 PM.  Appropriate orders placed.  AMIR GLAUS was informed that the remainder of the evaluation will be completed by another provider, this initial triage assessment does not replace that evaluation, and the importance of remaining in the ED until their evaluation is complete.    Willma Duwaine LITTIE, [place] 10/07/24 [phone]

## 2024-10-07 NOTE — ED Triage Notes (Signed)
 Pt reports she was at work across the street when she started having spots in her vision in her left eye at about 1230. Spots in vision now resolved but called her doctor and was advised to come to ED to be evaluated.

## 2024-10-07 NOTE — Telephone Encounter (Signed)
 FYI Only or Action Required?: FYI only for provider: ED advised.  Patient was last seen in primary care on 05/02/2024 by Panosh, Apolinar POUR, MD.  Called Nurse Triage reporting Migraine and Loss of Vision.  Symptoms began today.  Interventions attempted: Nothing.  Symptoms are: stable.  Triage Disposition: Go to ED Now (or PCP Triage)  Patient/caregiver understands and will follow disposition?: Yes                               1. DESCRIPTION: How has your vision changed? (e.g., complete vision loss, blurred vision, double vision, floaters, etc.)     Describes seeing flashes of light when looking out window on lunch break, states she saw zig zag design/floaters when removing her eyes away from the window and looking back towards the room Denies loss of vision 2. LOCATION: One or both eyes? If one, ask: Which eye?     Left eye 3. SEVERITY: Can you see anything? If Yes, ask: What can you see? (e.g., fine print)     States vision has returned back to normal 4. ONSET: When did this begin? Did it start suddenly or has this been gradual?     Suddenly today  9. OTHER SYMPTOMS: Do you have any other symptoms? (e.g., confusion, headache, arm or leg weakness, speech problems)     Pressure in head, denies headache, denies dizziness    This RN advised ED. Patient verbalized understanding and agreed to go.   Copied from CRM #[id number]. Topic: Clinical - Red Word Triage >> Oct 07, 2024  1:01 PM Aleatha C wrote: Red Word that prompted transfer to Nurse Triage: Patient experience a migraine where she couldn't see, lost vision for a moment and flashing lights  Reason for Disposition  Patient sounds very sick or weak to the triager  Answer Assessment - Initial Assessment Questions See above.  Protocols used: Vision Loss or Change-A-AH

## 2024-10-07 NOTE — ED Triage Notes (Signed)
 Pt c/o spots and zig zags in L vision earlier.  Sts symptoms have resolved.  Pt denies pain.  Sts she feels like she is in a fog.  Pt reports I did my Google search and it's an ocular migraine.  Denies dizziness.

## 2024-10-07 NOTE — Discharge Instructions (Signed)
 I discussed the plan for discharge with the patient and/or their surrogate at bedside prior to discharge and they were in agreement with the plan and verbalized understanding of the return precautions provided. All questions answered to the best of my ability. Ultimately, the patient was discharged in stable condition with stable vital signs. I am reassured that they are capable of close follow up and good social support at home.

## 2024-10-07 NOTE — ED Provider Notes (Signed)
 [facility] Provider Note   CSN: [id number] Arrival date & time: 10/07/24  1409     Patient presents with: Eye Problem   Sharon Wells is a 63 y.o. female. Hx of CAD (calcium  score 48 on CT scan in 2022, 85th percentile for age) presenting with vision changes in the L eye today. Hx per pt. reports approximately 1230, she was at work, had another patient.  She suddenly looked at the window, and noticed zigzagging vision and high leading of pushes outside of her window.  She said she cleaned her glasses, and then covered up her left eye, did not see anything out of the right eye that was concerning.  She continued to have the symptoms in her left eye, denies any pain during this time.  She endorses that the symptoms lasted for a total time of approximately 20 minutes.  She denies any recent trauma to her eye.  She denies any shading of her vision, loss of peripheral vision, or complete loss of vision for short period.  She is concerned that she may have had an ocular migraine, however did not have any pain associated, and states that her vision feels completely resolved at this time.  She denies any headaches, head injury, nausea, vomiting, chest pain, shortness of breath, palpitations, fever, chills, or other recent illnesses.    Eye Problem      Prior to Admission medications   Medication Sig Start Date End Date Taking? Authorizing Provider  amLODipine  (NORVASC ) 2.5 MG tablet Take 1 tablet (2.5 mg total) by mouth daily. 05/02/24   Panosh, Wanda K, MD  Ascorbic Acid (VITA-C PO) Take by mouth.    [provider]  cyanocobalamin 2000 MCG tablet Take 2,000 mcg by mouth daily.    [provider]  famotidine  (PEPCID ) 20 MG tablet Take 20 mg by mouth daily.    [provider]  NON FORMULARY daily. [employer]: Multivitamin Complex with TRE-EN-EN Grain Concentrates    [provider]  rosuvastatin  (CRESTOR ) 20 MG tablet  Take 1 tablet (20 mg total) by mouth daily. 05/13/24   Okey Vina GAILS, MD    Allergies: Influenza vaccines and Egg protein-containing drug products    Review of Systems  Updated Vital Signs BP (!) 144/79 (BP Location: Right Arm)   Pulse 94   Temp 97.9 F (36.6 C) (Oral)   Resp 16   Ht 5' 3 (1.6 m)   Wt 59.5 kg   LMP 12/15/2013   SpO2 100%   BMI 23.24 kg/m   Physical Exam Vitals and nursing note reviewed.  Constitutional:      General: She is not in acute distress.    Appearance: She is well-developed.  HENT:     Head: Normocephalic and atraumatic.     Mouth/Throat:     Mouth: Mucous membranes are moist.     Pharynx: Oropharynx is clear.  Eyes:     General: No scleral icterus.       Right eye: No discharge.     Extraocular Movements: Extraocular movements intact.     Conjunctiva/sclera: Conjunctivae normal.     Pupils: Pupils are equal, round, and reactive to light.     Comments: Peripheral fields are intact, pupils 4 mm, PERRL.  Visual acuity equal bilaterally.  Cardiovascular:     Rate and Rhythm: Normal rate and regular rhythm.     Heart sounds: No murmur heard. Pulmonary:     Effort: Pulmonary effort  is normal. No respiratory distress.     Breath sounds: Normal breath sounds.  Abdominal:     Palpations: Abdomen is soft.     Tenderness: There is no abdominal tenderness.  Musculoskeletal:        General: No swelling.     Cervical back: Neck supple. No rigidity.  Skin:    General: Skin is warm and dry.     Capillary Refill: Capillary refill takes less than 2 seconds.  Neurological:     General: No focal deficit present.     Mental Status: She is alert and oriented to person, place, and time. Mental status is at baseline.     Cranial Nerves: No cranial nerve deficit.     Sensory: No sensory deficit.     Motor: No weakness.     Coordination: Coordination normal.     Gait: Gait normal.     Comments: Cranial nerves II through XII intact, normal motor and sensory  function.  Normal gait.  Psychiatric:        Mood and Affect: Mood normal.     (all labs ordered are listed, but only abnormal results are displayed) Labs Reviewed  COMPREHENSIVE METABOLIC PANEL WITH GFR - Abnormal; Notable for the following components:      Result Value   Glucose, Bld 106 (*)    All other components within normal limits  CBC WITH DIFFERENTIAL/PLATELET  MAGNESIUM    EKG: None  Radiology: CT Head Wo Contrast Result Date: 10/07/2024 CLINICAL DATA:  Headache, vision changes. EXAM: CT HEAD WITHOUT CONTRAST TECHNIQUE: Contiguous axial images were obtained from the base of the skull through the vertex without intravenous contrast. RADIATION DOSE REDUCTION: This exam was performed according to the departmental dose-optimization program which includes automated exposure control, adjustment of the mA and/or kV according to patient size and/or use of iterative reconstruction technique. COMPARISON:  None Available. FINDINGS: Brain: No acute intracranial hemorrhage. No CT evidence of acute infarct. No edema, mass effect, or midline shift. The basilar cisterns are patent. Ventricles: The ventricles are normal. Vascular: No hyperdense vessel or unexpected calcification. Skull: No acute or aggressive finding. Orbits: Orbits are symmetric. Sinuses: The visualized paranasal sinuses are clear. Other: Mastoid air cells are clear. IMPRESSION: No CT evidence of acute intracranial abnormality. Electronically Signed   By: Donnice Mania M.D.   On: 10/07/2024 17:50     Ultrasound ED Ocular  Date/Time: 10/07/2024 9:29 PM  Performed by: Arlee Katz, MD Authorized by: Tonia Chew, MD   PROCEDURE DETAILS:    Indications: visual change     Assessed:  Left eye   Left eye axial view: obtained     Left eye saggital view: obtained     Images: not archived     Limitations:  None LEFT EYE FINDINGS:     vitreous hemorrhage in left eye (scant areas of debri w/in vitreous field)     Medications Ordered in the ED - No data to display                                  Medical Decision Making  Based on patient presentation, history, evaluation, high suspicion for posterior vitreous detachment, which was also evaluated on bedside ultrasound today.  Patient overall symptoms completely resolved after 20 minutes, will still intermittently see some floaters in her left eye however overall workup is very reassuring.  I have very low suspicion for amaurosis fugax  versus CRAO versus acute stroke versus retinal detachment versus retinal artery dissection versus intracranial bleed versus intracranial mass.  Recommend follow-up with ophthalmology, and a referral was provided to the patient.  Overall with reassuring workup and neurologic status today, patient is stable for discharge at this time with follow-up with ophthalmology in outpatient setting.  Discussed return precautions to the ED.     Final diagnoses:  Posterior vitreous detachment of left eye    ED Discharge Orders          Ordered    Ambulatory referral to Ophthalmology        10/07/24 2055               Arlee Katz, MD 10/08/24 9947    Tonia Chew, MD 10/11/24 [phone]

## 2024-10-08 ENCOUNTER — Telehealth: Payer: Self-pay

## 2024-10-08 NOTE — Telephone Encounter (Signed)
 Received vm from patient that [facility] could not accept referral. Call returned to patient. It appears [facility] clinic is on call this week. Texted patient contact information.   Merilee Batty, MSN, RN Case Management [phone]

## 2024-10-15 ENCOUNTER — Ambulatory Visit: Admitting: Obstetrics and Gynecology

## 2024-10-27 DIAGNOSIS — H43393 Other vitreous opacities, bilateral: Secondary | ICD-10-CM | POA: Diagnosis not present

## 2024-10-28 ENCOUNTER — Encounter: Payer: Self-pay | Admitting: Internal Medicine

## 2024-11-05 NOTE — Telephone Encounter (Signed)
 Sorry that happened  Yes need to see ophthalmologist specialist   and then  keep me informed and fu . If need before due for yearly visit .

## 2024-11-15 ENCOUNTER — Ambulatory Visit
Admission: RE | Admit: 2024-11-15 | Discharge: 2024-11-15 | Disposition: A | Discharge status: Home / Self Care | Source: Ambulatory Visit | Attending: Obstetrics and Gynecology | Admitting: Obstetrics and Gynecology

## 2024-11-15 DIAGNOSIS — Z1231 Encounter for screening mammogram for malignant neoplasm of breast: Secondary | ICD-10-CM

## 2024-11-18 ENCOUNTER — Ambulatory Visit: Payer: Self-pay | Admitting: Obstetrics and Gynecology

## 2024-11-18 ENCOUNTER — Ambulatory Visit: Admitting: Obstetrics and Gynecology

## 2024-11-19 ENCOUNTER — Other Ambulatory Visit (HOSPITAL_COMMUNITY): Payer: Self-pay

## 2024-11-25 ENCOUNTER — Other Ambulatory Visit: Payer: Self-pay

## 2024-12-03 ENCOUNTER — Encounter: Payer: Self-pay | Admitting: Internal Medicine

## 2024-12-03 DIAGNOSIS — E7849 Other hyperlipidemia: Secondary | ICD-10-CM

## 2024-12-03 DIAGNOSIS — I2510 Atherosclerotic heart disease of native coronary artery without angina pectoris: Secondary | ICD-10-CM

## 2024-12-05 MED ORDER — ROSUVASTATIN CALCIUM 20 MG PO TABS: 20.0000 mg | ORAL_TABLET | Freq: Every day | ORAL | 1 refills | Status: AC

## 2024-12-17 ENCOUNTER — Ambulatory Visit: Admitting: Obstetrics and Gynecology

## 2025-01-02 ENCOUNTER — Ambulatory Visit: Admitting: Obstetrics and Gynecology

## 2025-05-08 ENCOUNTER — Encounter: Admitting: Internal Medicine
